# Patient Record
Sex: Male | Born: 1950 | Race: White | Hispanic: No | Marital: Married | Smoking: Never smoker
Health system: Southern US, Community
[De-identification: ages and names within clinical notes are randomized; demographics above are authoritative.]

## PROBLEM LIST (undated history)

## (undated) DIAGNOSIS — L570 Actinic keratosis: Secondary | ICD-10-CM

## (undated) DIAGNOSIS — M1990 Unspecified osteoarthritis, unspecified site: Secondary | ICD-10-CM

## (undated) DIAGNOSIS — IMO0001 Reserved for inherently not codable concepts without codable children: Secondary | ICD-10-CM

## (undated) DIAGNOSIS — M109 Gout, unspecified: Secondary | ICD-10-CM

## (undated) DIAGNOSIS — E785 Hyperlipidemia, unspecified: Secondary | ICD-10-CM

## (undated) DIAGNOSIS — I10 Essential (primary) hypertension: Secondary | ICD-10-CM

## (undated) HISTORY — DX: Reserved for inherently not codable concepts without codable children: IMO0001

## (undated) HISTORY — DX: Gout, unspecified: M10.9

## (undated) HISTORY — DX: Essential (primary) hypertension: I10

## (undated) HISTORY — PX: OTHER SURGICAL HISTORY: SHX169

## (undated) HISTORY — DX: Unspecified osteoarthritis, unspecified site: M19.90

## (undated) HISTORY — PX: VARICOSE VEIN SURGERY: SHX832

## (undated) HISTORY — DX: Hyperlipidemia, unspecified: E78.5

## (undated) HISTORY — DX: Actinic keratosis: L57.0

---

## 1993-03-27 DIAGNOSIS — IMO0001 Reserved for inherently not codable concepts without codable children: Secondary | ICD-10-CM

## 1993-03-27 HISTORY — DX: Reserved for inherently not codable concepts without codable children: IMO0001

## 2007-08-12 ENCOUNTER — Ambulatory Visit: Payer: Self-pay | Admitting: Gastroenterology

## 2009-04-06 LAB — HM COLONOSCOPY

## 2011-01-30 ENCOUNTER — Ambulatory Visit (INDEPENDENT_AMBULATORY_CARE_PROVIDER_SITE_OTHER): Payer: Self-pay | Admitting: Internal Medicine

## 2011-01-30 ENCOUNTER — Encounter: Payer: Self-pay | Admitting: Internal Medicine

## 2011-01-30 VITALS — BP 142/80 | HR 68 | Temp 98.2°F | Wt 196.0 lb

## 2011-01-30 DIAGNOSIS — G4700 Insomnia, unspecified: Secondary | ICD-10-CM

## 2011-01-30 DIAGNOSIS — Z0000 Encounter for general adult medical examination without abnormal findings: Secondary | ICD-10-CM

## 2011-01-30 DIAGNOSIS — I10 Essential (primary) hypertension: Secondary | ICD-10-CM

## 2011-01-30 MED ORDER — LISINOPRIL-HYDROCHLOROTHIAZIDE 20-25 MG PO TABS: 1.0000 | ORAL_TABLET | Freq: Every day | ORAL | Status: DC

## 2011-01-30 MED ORDER — TRAZODONE HCL 50 MG PO TABS: 50.0000 mg | ORAL_TABLET | Freq: Every day | ORAL | Status: AC

## 2011-01-30 NOTE — Progress Notes (Signed)
Subjective:    Patient ID: Chris Johnson, male    DOB: July 15, 1950, 60 y.o.   MRN: [id number]  HPI 60 year old male with a history of hypertension presents to establish care. He reports that he has had hypertension percent we'll years. He was started on lisinopril hydrochlorothiazide by his previous physician. He notes that recently his daughter who is a Theatre stage manager took his blood pressure and it was 165/100. He was concerned about this, in particular because his mother had a stroke in her late 40s. He denies any chest pain, palpitations, headache, shortness of breath. He reports fairly good compliance with his medication although notes that occasionally he forgets it. He reports that over the last several years he has improved his diet to limit saturated fat and increase fiber. 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  He notes a long history of insomnia and reports that he has been using Benadryl 100 mg nightly for the last several months. He reports good sleep on this dose. However, he notes that he has had to increase the dose in order to maintain a good night sleep. He is interested in trying other medications. He notes that he is tried Ambien in the past with improvement in sleep.  Outpatient Encounter Prescriptions as of 01/30/2011  Medication Sig Dispense Refill  . lisinopril-hydrochlorothiazide (PRINZIDE,ZESTORETIC) 20-25 MG per tablet Take 1 tablet by mouth daily.  90 tablet  4    Review of Systems  Constitutional: Negative for fever, chills, activity change, appetite change, fatigue and unexpected weight change.  Eyes: Negative for visual disturbance.  Respiratory: Negative for cough and shortness of breath.   Cardiovascular: Negative for chest pain, palpitations and leg swelling.  Gastrointestinal: Negative for abdominal pain and abdominal distention.  Genitourinary: Negative for dysuria, urgency and difficulty urinating.    Musculoskeletal: Negative for arthralgias and gait problem.  Skin: Negative for color change and rash.  Hematological: Negative for adenopathy.  Psychiatric/Behavioral: Positive for sleep disturbance. Negative for dysphoric mood. The patient is not nervous/anxious.    BP 142/80  Pulse 68  Temp(Src) 98.2 F (36.8 C) (Oral)  Wt 196 lb (88.905 kg)  SpO2 95%     Objective:   Physical Exam  Constitutional: He is oriented to person, place, and time. He appears well-developed and well-nourished. No distress.  HENT:  Head: Normocephalic and atraumatic.  Right Ear: External ear normal.  Left Ear: External ear normal.  Nose: Nose normal.  Mouth/Throat: Oropharynx is clear and moist. No oropharyngeal exudate.  Eyes: Conjunctivae and EOM are normal. Pupils are equal, round, and reactive to light. Right eye exhibits no discharge. Left eye exhibits no discharge. No scleral icterus.  Neck: Normal range of motion. Neck supple. Carotid bruit is not present. No tracheal deviation present. No thyromegaly present.  Cardiovascular: Normal rate, regular rhythm and normal heart sounds.  Exam reveals no gallop and no friction rub.   No murmur heard. Pulmonary/Chest: Effort normal and breath sounds normal. No respiratory distress. He has no wheezes. He has no rales. He exhibits no tenderness.  Abdominal: Soft. Bowel sounds are normal. He exhibits no distension and no mass. There is no tenderness. There is no rebound and no guarding.  Musculoskeletal: Normal range of motion. He exhibits no edema.  Lymphadenopathy:    He has no cervical adenopathy.  Neurological: He is alert and oriented to person, place, and time. No cranial nerve deficit. Coordination normal.  Skin: Skin  is warm and dry. No rash noted. He is not diaphoretic. No erythema. No pallor.  Psychiatric: He has a normal mood and affect. His behavior is normal. Judgment and thought content normal.          Assessment & Plan:  1. Hypertension  - BP just above goal today. Pt reports occasional elevated BP at home. Will monitor and add medication as necessary. Will check renal function with labs next week. Follow up 1 month.  2. Insomnia - Pt using 100mg  nightly of Benadryl with minimal improvement. Will try Trazodone 50mg  nightly, and advance as needed up to 150mg  nightly. Follow up 1 month.

## 2011-01-30 NOTE — Patient Instructions (Signed)
Labs next Monday. Follow up 1 month.

## 2011-02-06 ENCOUNTER — Other Ambulatory Visit (INDEPENDENT_AMBULATORY_CARE_PROVIDER_SITE_OTHER): Payer: PRIVATE HEALTH INSURANCE | Admitting: *Deleted

## 2011-02-06 DIAGNOSIS — I10 Essential (primary) hypertension: Secondary | ICD-10-CM

## 2011-02-06 DIAGNOSIS — Z0000 Encounter for general adult medical examination without abnormal findings: Secondary | ICD-10-CM

## 2011-02-07 ENCOUNTER — Telehealth: Payer: Self-pay | Admitting: Internal Medicine

## 2011-02-07 ENCOUNTER — Ambulatory Visit: Payer: PRIVATE HEALTH INSURANCE | Admitting: *Deleted

## 2011-02-07 DIAGNOSIS — Z136 Encounter for screening for cardiovascular disorders: Secondary | ICD-10-CM

## 2011-02-07 LAB — CBC WITH DIFFERENTIAL/PLATELET
Eosinophils Absolute: 0.2 10*3/uL (ref 0.0–0.7)
Eosinophils Relative: 2.9 % (ref 0.0–5.0)
HCT: 43.3 % (ref 39.0–52.0)
Lymphs Abs: 1.8 10*3/uL (ref 0.7–4.0)
MCHC: 34.3 g/dL (ref 30.0–36.0)
MCV: 87.3 fl (ref 78.0–100.0)
Monocytes Absolute: 0.5 10*3/uL (ref 0.1–1.0)
Neutrophils Relative %: 57 % (ref 43.0–77.0)
Platelets: 253 10*3/uL (ref 150.0–400.0)
RDW: 13.4 % (ref 11.5–14.6)
WBC: 5.8 10*3/uL (ref 4.5–10.5)

## 2011-02-07 LAB — LIPID PANEL
Cholesterol: 239 mg/dL — ABNORMAL HIGH (ref 0–200)
HDL: 41.9 mg/dL (ref >39.00)
Triglycerides: 260 mg/dL — ABNORMAL HIGH (ref 0.0–149.0)

## 2011-02-07 LAB — COMPREHENSIVE METABOLIC PANEL
Albumin: 4 g/dL (ref 3.5–5.2)
BUN: 19 mg/dL (ref 6–23)
Calcium: 9.2 mg/dL (ref 8.4–10.5)
Chloride: 101 mEq/L (ref 96–112)
Creatinine, Ser: 1.3 mg/dL (ref 0.4–1.5)
GFR: 59.71 mL/min — ABNORMAL LOW (ref >60.00)
Glucose, Bld: 92 mg/dL (ref 70–99)
Potassium: 3.6 mEq/L (ref 3.5–5.1)

## 2011-02-07 NOTE — Telephone Encounter (Signed)
EKG normal.

## 2011-02-07 NOTE — Telephone Encounter (Signed)
Left detailed VM on pt's #

## 2011-02-13 ENCOUNTER — Ambulatory Visit (INDEPENDENT_AMBULATORY_CARE_PROVIDER_SITE_OTHER): Payer: PRIVATE HEALTH INSURANCE | Admitting: Internal Medicine

## 2011-02-13 ENCOUNTER — Encounter: Payer: Self-pay | Admitting: Internal Medicine

## 2011-02-13 VITALS — BP 92/60 | HR 61 | Temp 98.3°F | Resp 14 | Wt 196.2 lb

## 2011-02-13 DIAGNOSIS — I10 Essential (primary) hypertension: Secondary | ICD-10-CM

## 2011-02-13 DIAGNOSIS — E785 Hyperlipidemia, unspecified: Secondary | ICD-10-CM

## 2011-02-13 MED ORDER — LISINOPRIL-HYDROCHLOROTHIAZIDE 20-25 MG PO TABS: 1.0000 | ORAL_TABLET | Freq: Every day | ORAL | Status: DC

## 2011-02-13 NOTE — Progress Notes (Signed)
Subjective:    Patient ID: Chris Johnson, male    DOB: 1950-09-07, 60 y.o.   MRN: [id number]  HPI 60 year old male with a history of hypertension presents for followup. He reports that over the last month he has increased his compliance with his lisinopril hydrochlorothiazide. He notes that his blood pressure has been running lower. Last week he had an illness in which he had some nausea and vomiting and reports feeling very fatigued. He felt lightheaded during that time. He reports feeling better over the weekend and today. Last night, his systolic blood pressure was 110. He reports that he has also made more of an effort at eating healthy and exercising.  After his last visit, he had lab work including lipid profile which showed elevated LDL cholesterol at 156. We reviewed his lab work today.  Outpatient Encounter Prescriptions as of 02/13/2011  Medication Sig Dispense Refill  . lisinopril-hydrochlorothiazide (PRINZIDE,ZESTORETIC) 20-25 MG per tablet Take 1 tablet by mouth daily.  90 tablet  4  . traZODone (DESYREL) 50 MG tablet Take 1 tablet (50 mg total) by mouth at bedtime.  30 tablet  3    Review of Systems  Constitutional: Positive for fatigue (last week, resolved). Negative for fever, chills, activity change, appetite change and unexpected weight change.  Eyes: Negative for visual disturbance.  Respiratory: Negative for cough and shortness of breath.   Cardiovascular: Negative for chest pain, palpitations and leg swelling.  Gastrointestinal: Positive for nausea and vomiting (last week, resolved). Negative for abdominal pain and abdominal distention.  Genitourinary: Negative for dysuria, urgency and difficulty urinating.  Musculoskeletal: Negative for arthralgias and gait problem.  Skin: Negative for color change and rash.  Neurological: Positive for dizziness (last week, resolved).  Hematological: Negative for adenopathy.  Psychiatric/Behavioral: Negative for sleep disturbance and  dysphoric mood. The patient is not nervous/anxious.    BP 92/60  Pulse 61  Temp(Src) 98.3 F (36.8 C) (Oral)  Resp 14  Wt 196 lb 4 oz (89.018 kg)  SpO2 95%     Objective:   Physical Exam  Constitutional: He is oriented to person, place, and time. He appears well-developed and well-nourished. No distress.  HENT:  Head: Normocephalic and atraumatic.  Right Ear: External ear normal.  Left Ear: External ear normal.  Nose: Nose normal.  Mouth/Throat: Oropharynx is clear and moist. No oropharyngeal exudate.  Eyes: Conjunctivae and EOM are normal. Pupils are equal, round, and reactive to light. Right eye exhibits no discharge. Left eye exhibits no discharge. No scleral icterus.  Neck: Normal range of motion. Neck supple. No tracheal deviation present. No thyromegaly present.  Cardiovascular: Normal rate, regular rhythm and normal heart sounds.  Exam reveals no gallop and no friction rub.   No murmur heard. Pulmonary/Chest: Effort normal and breath sounds normal. No respiratory distress. He has no wheezes. He has no rales. He exhibits no tenderness.  Musculoskeletal: Normal range of motion. He exhibits no edema.  Lymphadenopathy:    He has no cervical adenopathy.  Neurological: He is alert and oriented to person, place, and time. No cranial nerve deficit. Coordination normal.  Skin: Skin is warm and dry. No rash noted. He is not diaphoretic. No erythema. No pallor.  Psychiatric: He has a normal mood and affect. His behavior is normal. Judgment and thought content normal.          Assessment & Plan:  1. Hypertension - blood pressure is running much lower, likely with increased compliance with medication. We discussed stopping the hydrochlorothiazide  component of this medication. He will continue to monitor his blood pressure, if his systolic blood pressure is running below 120 we will stop his hydrochlorothiazide. Otherwise, he will continue his medication and followup in 3 months.  2.  Hyperlipidemia - LDL 156. Goal <100. We discussed options for treatment of high cholesterol including statin medications. Recommended starting Lipitor 20 mg daily. We also discussed dietary interventions including limiting intake of saturated fat and increasing intake of fiber. He would prefer to give a trial of dietary and exercise interventions in an effort to lower his cholesterol. We'll plan to proceed with this and have him repeat his lipid profile in 3 months. Gave references on dietary interventions. If no improvement, will start Lipitor.

## 2011-02-13 NOTE — Patient Instructions (Signed)
Prevent and Reverse Heart Disease by Neomia Dear Repeat labs in 3 months.

## 2011-05-17 ENCOUNTER — Encounter: Payer: Self-pay | Admitting: Internal Medicine

## 2011-05-17 ENCOUNTER — Ambulatory Visit (INDEPENDENT_AMBULATORY_CARE_PROVIDER_SITE_OTHER): Payer: PRIVATE HEALTH INSURANCE | Admitting: Internal Medicine

## 2011-05-17 VITALS — BP 140/80 | HR 80 | Temp 97.8°F | Wt 197.0 lb

## 2011-05-17 DIAGNOSIS — H6690 Otitis media, unspecified, unspecified ear: Secondary | ICD-10-CM

## 2011-05-17 DIAGNOSIS — R42 Dizziness and giddiness: Secondary | ICD-10-CM

## 2011-05-17 DIAGNOSIS — H6692 Otitis media, unspecified, left ear: Secondary | ICD-10-CM

## 2011-05-17 LAB — COMPREHENSIVE METABOLIC PANEL
ALT: 13 U/L (ref 0–53)
AST: 17 U/L (ref 0–37)
Creat: 1.45 mg/dL — ABNORMAL HIGH (ref 0.50–1.35)
Sodium: 137 mEq/L (ref 135–145)
Total Bilirubin: 0.3 mg/dL (ref 0.3–1.2)

## 2011-05-17 LAB — LIPID PANEL
HDL: 38 mg/dL — ABNORMAL LOW (ref >39)
LDL Cholesterol: 127 mg/dL — ABNORMAL HIGH (ref 0–99)
Triglycerides: 253 mg/dL — ABNORMAL HIGH (ref <150)
VLDL: 51 mg/dL — ABNORMAL HIGH (ref 0–40)

## 2011-05-17 LAB — CBC WITH DIFFERENTIAL/PLATELET
Basophils Absolute: 0.1 10*3/uL (ref 0.0–0.1)
Basophils Relative: 1 % (ref 0–1)
Eosinophils Absolute: 0.2 10*3/uL (ref 0.0–0.7)
Eosinophils Relative: 3 % (ref 0–5)
HCT: 40.1 % (ref 39.0–52.0)
Lymphocytes Relative: 28 % (ref 12–46)
MCH: 28.6 pg (ref 26.0–34.0)
MCHC: 34.4 g/dL (ref 30.0–36.0)
MCV: 83.2 fL (ref 78.0–100.0)
Monocytes Absolute: 0.4 10*3/uL (ref 0.1–1.0)
RDW: 13.1 % (ref 11.5–15.5)

## 2011-05-17 MED ORDER — AMOXICILLIN-POT CLAVULANATE 875-125 MG PO TABS: 1.0000 | ORAL_TABLET | Freq: Two times a day (BID) | ORAL | Status: AC

## 2011-05-17 MED ORDER — MECLIZINE HCL 25 MG PO TABS: 25.0000 mg | ORAL_TABLET | Freq: Three times a day (TID) | ORAL | Status: AC | PRN

## 2011-05-17 NOTE — Assessment & Plan Note (Signed)
Symptoms now resolved. Symptoms most consistent with vertigo secondary to otitis media.  Will treat with augmentin x 10 days. Will check CBC, CMP, TSH, B12. If any recurrent symptoms, or new symptoms such as recurrent trouble with word finding, new focal neurologic changes such as numbness, pt will go to ED for evaluation to including CT head. Otherwise follow up here 1 week.

## 2011-05-17 NOTE — Progress Notes (Signed)
Subjective:    Patient ID: Chris Johnson, male    DOB: Nov 03, 1950, 61 y.o.   MRN: [id number]  HPI 61YO male with h/o HTN presents as walk-in acute visit c/o 1 day h/o dizziness, malaise.  Also notes recent sore throat intermittently over last month, mild nasal congestion and occasional loose stool.  Denies fever or chills. Today, had some trouble with thinking clearly, word finding. Describes some anxiety that symptoms might be related to elevated BP.  Has been compliant with BP meds. Denies any chest pain, neck pain, headache, focal neurologic symptoms such as weakness or numbness.  Outpatient Encounter Prescriptions as of 05/17/2011  Medication Sig Dispense Refill  . lisinopril-hydrochlorothiazide (PRINZIDE,ZESTORETIC) 20-25 MG per tablet Take 1 tablet by mouth daily.  90 tablet  4  . Red Yeast Rice 600 MG CAPS Take 1 capsule by mouth daily.      Marland Kitchen amoxicillin-clavulanate (AUGMENTIN) 875-125 MG per tablet Take 1 tablet by mouth 2 (two) times daily.  20 tablet  0  . meclizine (ANTIVERT) 25 MG tablet Take 1 tablet (25 mg total) by mouth 3 (three) times daily as needed.  30 tablet  0    Review of Systems  Constitutional: Positive for fatigue. Negative for fever, chills, activity change, appetite change and unexpected weight change.  HENT: Positive for congestion and sore throat. Negative for ear pain, trouble swallowing and voice change.   Eyes: Positive for pain. Negative for visual disturbance.  Respiratory: Negative for cough and shortness of breath.   Cardiovascular: Negative for chest pain, palpitations and leg swelling.  Gastrointestinal: Negative for abdominal pain and abdominal distention.  Genitourinary: Negative for dysuria, urgency and difficulty urinating.  Musculoskeletal: Negative for arthralgias and gait problem.  Skin: Negative for color change and rash.  Neurological: Positive for dizziness. Negative for tremors, seizures, syncope, facial asymmetry, speech difficulty,  weakness, light-headedness, numbness and headaches.  Hematological: Negative for adenopathy.  Psychiatric/Behavioral: Positive for decreased concentration. Negative for sleep disturbance and dysphoric mood. The patient is nervous/anxious.    BP 140/80  Pulse 80  Temp(Src) 97.8 F (36.6 C) (Oral)  Wt 197 lb (89.359 kg)  SpO2 98%     Objective:   Physical Exam  Constitutional: He is oriented to person, place, and time. He appears well-developed and well-nourished. No distress.  HENT:  Head: Normocephalic and atraumatic.  Right Ear: External ear normal. Tympanic membrane is erythematous. A middle ear effusion is present.  Left Ear: External ear normal. Tympanic membrane is erythematous. A middle ear effusion is present.  Nose: Nose normal.  Mouth/Throat: Oropharynx is clear and moist. No oropharyngeal exudate or posterior oropharyngeal erythema.       Bilateral ear canals initially impacted with cerumen, which was removed with lavage.  Eyes: Conjunctivae and EOM are normal. Pupils are equal, round, and reactive to light. Right eye exhibits no discharge. Left eye exhibits no discharge. No scleral icterus.  Neck: Normal range of motion. Neck supple. No tracheal deviation present. No thyromegaly present.  Cardiovascular: Normal rate, regular rhythm and normal heart sounds.  Exam reveals no gallop and no friction rub.   No murmur heard. Pulmonary/Chest: Effort normal and breath sounds normal. No respiratory distress. He has no wheezes. He has no rales. He exhibits no tenderness.  Musculoskeletal: Normal range of motion. He exhibits no edema.  Lymphadenopathy:    He has no cervical adenopathy.  Neurological: He is alert and oriented to person, place, and time. He has normal strength. He displays no atrophy and  no tremor. No cranial nerve deficit or sensory deficit. He exhibits normal muscle tone. Coordination and gait normal.  Skin: Skin is warm and dry. No rash noted. He is not diaphoretic.  No erythema. No pallor.  Psychiatric: He has a normal mood and affect. His behavior is normal. Judgment and thought content normal.          Assessment & Plan:

## 2011-05-17 NOTE — Assessment & Plan Note (Signed)
Exam c/w vertigo. Will treat with augmentin x 10 days. Pt will RTC 1 week for recheck or sooner if symptoms not improving.

## 2011-05-18 ENCOUNTER — Telehealth: Payer: Self-pay | Admitting: Internal Medicine

## 2011-05-18 ENCOUNTER — Encounter: Payer: Self-pay | Admitting: Internal Medicine

## 2011-05-18 NOTE — Telephone Encounter (Signed)
[facility] Triage Call Report Triage Record Num: [id number] Operator: Audelia Hives Patient Name: Chris Johnson Call Date & Time: 05/17/2011 6:04:40PM Patient Phone: [phone] PCP: Ronna Polio Patient Gender: Male PCP Fax : [phone] Patient DOB: 11/12/1950 Practice Name: [facility] Reason for Call: Caller: Delaney Meigs; PCP: Ronna Polio; CB#: [phone]; Call regarding Delaney Meigs is calling from [place] regarding a CBC ordered on Chris Johnson by Ronna Polio.; Tamara calling re STAT labs, CBC normal, LDL 127, VLDL 51, HDL 38, Trig 253, BUN 16, CR 1.45, Chol 216, TSH and B12 pending. No criticals to report. Protocol(s) Used: PCP Calls, No Triage (Adult) Recommended Outcome per Protocol: Call Provider within 24 Hours Reason for Outcome: Lab calling with test results Care Advice: ~ 05/17/2011 6:18:35PM Page 1 of 1 [email or web address]

## 2011-06-09 ENCOUNTER — Ambulatory Visit (INDEPENDENT_AMBULATORY_CARE_PROVIDER_SITE_OTHER): Payer: PRIVATE HEALTH INSURANCE | Admitting: Internal Medicine

## 2011-06-09 ENCOUNTER — Encounter: Payer: Self-pay | Admitting: Internal Medicine

## 2011-06-09 VITALS — BP 118/76 | HR 100 | Temp 98.6°F | Resp 18 | Ht 71.0 in | Wt 190.2 lb

## 2011-06-09 DIAGNOSIS — J019 Acute sinusitis, unspecified: Secondary | ICD-10-CM

## 2011-06-09 DIAGNOSIS — H109 Unspecified conjunctivitis: Secondary | ICD-10-CM

## 2011-06-09 MED ORDER — GUAIFENESIN-CODEINE 100-10 MG/5ML PO SYRP: 5.0000 mL | ORAL_SOLUTION | Freq: Three times a day (TID) | ORAL | Status: AC | PRN

## 2011-06-09 MED ORDER — PREDNISONE (PAK) 10 MG PO TABS: ORAL_TABLET | ORAL | Status: AC

## 2011-06-09 MED ORDER — AMOXICILLIN-POT CLAVULANATE 875-125 MG PO TABS: 1.0000 | ORAL_TABLET | Freq: Two times a day (BID) | ORAL | Status: DC

## 2011-06-09 NOTE — Progress Notes (Deleted)
  Subjective:    Patient ID: Chris Johnson, male    DOB: 11-15-1950, 61 y.o.   MRN: [id number]  HPI  4 day history of productive cough,  Mild myalgias,  phlegm production,    Congestion,,   No ear pain,  No  has conjuncitivitis    Review of Systems     Objective:   Physical Exam        Assessment & Plan:

## 2011-06-09 NOTE — Patient Instructions (Addendum)
Start Benadryl 25 mg every every 6 to 8 hours for drainage,  Sudafed PE 10 to 30 mg  every 8 hours for the congestion and  Simply  Saline lavage AM and PM  To flush out siniuses  Instead of night time sudafed use OTC Afrin nasal spray (after your saline spray)   10 days of augmentin as your antibiotic   Cough medicine with codeine  cheratussin (take one hour before bedtime)     Start the prednisone on Sunday or Monday if your congestion is not better.

## 2011-06-11 ENCOUNTER — Encounter: Payer: Self-pay | Admitting: Internal Medicine

## 2011-06-11 DIAGNOSIS — H109 Unspecified conjunctivitis: Secondary | ICD-10-CM | POA: Insufficient documentation

## 2011-06-11 DIAGNOSIS — J019 Acute sinusitis, unspecified: Secondary | ICD-10-CM | POA: Insufficient documentation

## 2011-06-11 NOTE — Progress Notes (Signed)
Patient ID: Chris Johnson, male   DOB: 02/11/1951, 61 y.o.   MRN: [id number]  Patient Active Problem List  Diagnoses  . Hypertension  . Insomnia  . Dizziness  . Left otitis media    Subjective:   Chief Complaint  Patient presents with  . Cough  . Conjunctivitis    possible    HPI:   Chris Johnson a 61 y.o. male who presents with a 4 day history of productive cough accompanied by md myalgias low-grade fevers and excessive discolored phlegm production. He notes a sinus congestion and scratchy throat but denies ear pain and dizziness. He developed conjunctivitis on the morning of evaluation. He has several sick contacts in his home. All affected members are having the same symptoms. His daughter was treated for bacterial conjunctivitis yesterday.  Review of Systems:  12 point  review of systems is negative except for those addressed in the HPI,    The following portions of the patient's history were reviewed and updated as appropriate: Allergies, current medications, and problem list. Past Medical History  Diagnosis Date  . Hypertension   . Arthritis   . Normal cardiac stress test 1995    cardiolyte   Past Surgical History  Procedure Date  . Varicose vein surgery    History   Social History  . Marital Status: Married    Spouse Name: N/A    Number of Children: 1  . Years of Education: N/A   Occupational History  . Not on file.   Social History Main Topics  . Smoking status: Never Smoker   . Smokeless tobacco: Never Used  . Alcohol Use: 2.5 oz/week    5 drink(s) per week     Occasional  . Drug Use: No  . Sexually Active: Not on file   Other Topics Concern  . Not on file   Social History Narrative   Regular Exercise -  House/yard work 15 acresDaily Caffeine Use:  1 cup coffee, occasional sodaLives in [place]ing [place].     Objective:  BP 118/76  Pulse 100  Temp(Src) 98.6 F (37 C) (Oral)  Resp 18  Ht 5\' 11"  (1.803 m)  Wt 190 lb 4  oz (86.297 kg)  BMI 26.53 kg/m2  SpO2 95%  General:  Well-developed, well-nourished. In acute mild distress General appearance: alert, cooperative, appears stated age, flushed and mild distress Eyes: positive findings: conjunctiva: 1+ bacterial conjunctivitis Ears: normal TM's and external ear canals both ears Nose: Nares normal. Septum midline. Mucosa normal. No drainage or sinus tenderness., mild congestion, turbinates red, swollen, right turbinate red, swollen, sinus tenderness bilateral Throat: normal findings: lips normal without lesions Lungs: clear to auscultation bilaterally Chest wall: no tenderness    No Follow-up on file.   Medications:   Current Outpatient Prescriptions  Medication Sig Dispense Refill  . lisinopril-hydrochlorothiazide (PRINZIDE,ZESTORETIC) 20-25 MG per tablet Take 1 tablet by mouth daily.  90 tablet  4  . Red Yeast Rice 600 MG CAPS Take 1 capsule by mouth daily.      . traZODone (DESYREL) 50 MG tablet Take 50 mg by mouth at bedtime.      Marland Kitchen amoxicillin-clavulanate (AUGMENTIN) 875-125 MG per tablet Take 1 tablet by mouth 2 (two) times daily.  20 tablet  0  . guaiFENesin-codeine (ROBITUSSIN AC) 100-10 MG/5ML syrup Take 5 mLs by mouth 3 (three) times daily as needed for cough.  240 mL  1  . predniSONE (STERAPRED UNI-PAK) 10 MG tablet 6 tablets on day 1,  decrease by tablet daily until gone  21 tablet  0      Sinusitis acute Given chronicity of symptoms, development of facial pain and exam consistent with bacterial URI,  Will treat with empiric antibiotics, decongestants, and saline lavage.    Conjunctivitis of both eyes I DID not address the bilateral conjunctivitis with a separate topical alignment since both eyes were affected and he was going to receive treatment with Augmentin. I recommended that he use sterile saline I flushed his and call back if his symptoms did not improve with use of Augmentin.    Updated Medication List Outpatient Encounter  Prescriptions as of 06/09/2011  Medication Sig Dispense Refill  . lisinopril-hydrochlorothiazide (PRINZIDE,ZESTORETIC) 20-25 MG per tablet Take 1 tablet by mouth daily.  90 tablet  4  . Red Yeast Rice 600 MG CAPS Take 1 capsule by mouth daily.      . traZODone (DESYREL) 50 MG tablet Take 50 mg by mouth at bedtime.      Marland Kitchen amoxicillin-clavulanate (AUGMENTIN) 875-125 MG per tablet Take 1 tablet by mouth 2 (two) times daily.  20 tablet  0  . guaiFENesin-codeine (ROBITUSSIN AC) 100-10 MG/5ML syrup Take 5 mLs by mouth 3 (three) times daily as needed for cough.  240 mL  1  . predniSONE (STERAPRED UNI-PAK) 10 MG tablet 6 tablets on day 1, decrease by tablet daily until gone  21 tablet  0

## 2011-06-11 NOTE — Assessment & Plan Note (Signed)
I DID not address the bilateral conjunctivitis with a separate topical alignment since both eyes were affected and he was going to receive treatment with Augmentin. I recommended that he use sterile saline I flushed his and call back if his symptoms did not improve with use of Augmentin.

## 2011-06-11 NOTE — Assessment & Plan Note (Signed)
Given chronicity of symptoms, development of facial pain and exam consistent with bacterial URI,  Will treat with empiric antibiotics, decongestants, and saline lavage.   

## 2011-10-02 ENCOUNTER — Encounter: Payer: Self-pay | Admitting: Internal Medicine

## 2011-10-02 ENCOUNTER — Other Ambulatory Visit: Payer: Self-pay | Admitting: Internal Medicine

## 2011-10-02 ENCOUNTER — Ambulatory Visit (INDEPENDENT_AMBULATORY_CARE_PROVIDER_SITE_OTHER): Payer: PRIVATE HEALTH INSURANCE | Admitting: Internal Medicine

## 2011-10-02 VITALS — BP 130/80 | HR 64 | Temp 98.7°F | Ht 71.0 in | Wt 193.2 lb

## 2011-10-02 DIAGNOSIS — H60399 Other infective otitis externa, unspecified ear: Secondary | ICD-10-CM

## 2011-10-02 DIAGNOSIS — H6093 Unspecified otitis externa, bilateral: Secondary | ICD-10-CM | POA: Insufficient documentation

## 2011-10-02 DIAGNOSIS — H6691 Otitis media, unspecified, right ear: Secondary | ICD-10-CM | POA: Insufficient documentation

## 2011-10-02 DIAGNOSIS — H6690 Otitis media, unspecified, unspecified ear: Secondary | ICD-10-CM

## 2011-10-02 MED ORDER — NEOMYCIN-POLYMYXIN-HC 3.5-10000-1 OT SUSP: 4.0000 [drp] | Freq: Two times a day (BID) | OTIC | Status: DC

## 2011-10-02 MED ORDER — AMOXICILLIN 500 MG PO TABS: 500.0000 mg | ORAL_TABLET | Freq: Three times a day (TID) | ORAL | Status: DC

## 2011-10-02 MED ORDER — AMOXICILLIN-POT CLAVULANATE 875-125 MG PO TABS: 1.0000 | ORAL_TABLET | Freq: Two times a day (BID) | ORAL | Status: DC

## 2011-10-02 MED ORDER — CIPROFLOXACIN-DEXAMETHASONE 0.3-0.1 % OT SUSP: 4.0000 [drp] | Freq: Two times a day (BID) | OTIC | Status: DC

## 2011-10-02 NOTE — Progress Notes (Signed)
Subjective:    Patient ID: Chris Johnson, male    DOB: [birth year], 61 y.o.   MRN: [id number]  HPI 61 year old male with history of hypertension presents for acute visit complaining of bilateral ear pain and purulent drainage from his ear canals. He denies any fever or chills. He notes he has been recently swimming. He reports history of recurrent external ear infections in the past. He has not taken any medication for this.  Outpatient Encounter Prescriptions as of 10/02/2011  Medication Sig Dispense Refill  . lisinopril-hydrochlorothiazide (PRINZIDE,ZESTORETIC) 20-25 MG per tablet Take 1 tablet by mouth daily.  90 tablet  4  . traZODone (DESYREL) 50 MG tablet Take 50 mg by mouth at bedtime.      Marland Kitchen amoxicillin-clavulanate (AUGMENTIN) 875-125 MG per tablet Take 1 tablet by mouth 2 (two) times daily.  20 tablet  0  . ciprofloxacin-dexamethasone (CIPRODEX) otic suspension Place 4 drops into both ears 2 (two) times daily.  7.5 mL  0  . DISCONTD: Red Yeast Rice 600 MG CAPS Take 1 capsule by mouth daily.        Review of Systems  Constitutional: Negative for fever, chills, activity change and fatigue.  HENT: Positive for ear pain and ear discharge. Negative for hearing loss, nosebleeds, congestion, sore throat, rhinorrhea, sneezing, trouble swallowing, neck pain, neck stiffness, voice change, postnasal drip, sinus pressure and tinnitus.   Eyes: Negative for discharge, redness, itching and visual disturbance.  Respiratory: Negative for cough, chest tightness, shortness of breath, wheezing and stridor.   Cardiovascular: Negative for chest pain and leg swelling.  Musculoskeletal: Negative for myalgias and arthralgias.  Skin: Negative for color change and rash.  Neurological: Negative for dizziness, facial asymmetry and headaches.  Psychiatric/Behavioral: Negative for disturbed wake/sleep cycle.   BP 130/80  Pulse 64  Temp 98.7 F (37.1 C) (Oral)  Ht 5\' 11"  (1.803 m)  Wt 193 lb 4 oz (87.658 kg)   BMI 26.95 kg/m2  SpO2 98%     Objective:   Physical Exam  Constitutional: He is oriented to person, place, and time. He appears well-developed and well-nourished. No distress.  HENT:  Head: Normocephalic and atraumatic.  Right Ear: There is drainage, swelling and tenderness. Tympanic membrane is erythematous and bulging. A middle ear effusion is present.  Left Ear: There is drainage, swelling and tenderness. Tympanic membrane is not erythematous and not bulging.  No middle ear effusion.  Nose: Nose normal.  Mouth/Throat: Oropharynx is clear and moist. No oropharyngeal exudate.  Eyes: Conjunctivae and EOM are normal. Pupils are equal, round, and reactive to light. Right eye exhibits no discharge. Left eye exhibits no discharge. No scleral icterus.  Neck: Normal range of motion. Neck supple. No tracheal deviation present. No thyromegaly present.  Cardiovascular: Normal rate, regular rhythm and normal heart sounds.  Exam reveals no gallop and no friction rub.   No murmur heard. Pulmonary/Chest: Effort normal and breath sounds normal. No respiratory distress. He has no wheezes. He has no rales. He exhibits no tenderness.  Musculoskeletal: Normal range of motion. He exhibits no edema.  Lymphadenopathy:    He has no cervical adenopathy.  Neurological: He is alert and oriented to person, place, and time. No cranial nerve deficit. Coordination normal.  Skin: Skin is warm and dry. No rash noted. He is not diaphoretic. No erythema. No pallor.  Psychiatric: He has a normal mood and affect. His behavior is normal. Judgment and thought content normal.          Assessment &  Plan:

## 2011-10-02 NOTE — Progress Notes (Addendum)
Patient called to request different Rxs for Augmentin and Ciprodex because they were both to expensive.  Rx for Amoxicillin and Cortisporin called to pharmacy.

## 2011-10-02 NOTE — Assessment & Plan Note (Signed)
Symptoms consistent with right OM. Will treat with augmentin. Pt will call if symptoms not improving over next 48hr.

## 2011-10-02 NOTE — Addendum Note (Signed)
Addended by: Gilmer Mor on: 10/02/2011 05:14 PM   Modules accepted: Orders

## 2011-10-02 NOTE — Assessment & Plan Note (Signed)
Symptoms and exam are consistent with bilateral otitis externa. Will treat with Ciprodex. Patient will call if symptoms are not improving over the next 48 hours. Encouraged him to refrain from swimming until infection cleared.

## 2011-11-01 ENCOUNTER — Encounter: Payer: Self-pay | Admitting: Internal Medicine

## 2011-11-01 MED ORDER — ZOLPIDEM TARTRATE ER 6.25 MG PO TBCR: 6.2500 mg | EXTENDED_RELEASE_TABLET | Freq: Every evening | ORAL | Status: DC | PRN

## 2011-11-01 MED ORDER — ATORVASTATIN CALCIUM 20 MG PO TABS: 20.0000 mg | ORAL_TABLET | Freq: Every day | ORAL | Status: DC

## 2011-11-06 ENCOUNTER — Other Ambulatory Visit: Payer: Self-pay | Admitting: *Deleted

## 2011-11-06 MED ORDER — ATORVASTATIN CALCIUM 20 MG PO TABS: 20.0000 mg | ORAL_TABLET | Freq: Every day | ORAL | Status: DC

## 2011-11-22 ENCOUNTER — Other Ambulatory Visit: Payer: Self-pay | Admitting: *Deleted

## 2011-11-22 MED ORDER — ZOLPIDEM TARTRATE ER 6.25 MG PO TBCR: 6.2500 mg | EXTENDED_RELEASE_TABLET | Freq: Every evening | ORAL | Status: DC | PRN

## 2011-11-22 MED ORDER — ATORVASTATIN CALCIUM 20 MG PO TABS: 20.0000 mg | ORAL_TABLET | Freq: Every day | ORAL | Status: DC

## 2011-11-23 NOTE — Telephone Encounter (Addendum)
Rx's for Ambien and Lipitor called to pharmacist at [facility].

## 2012-02-02 ENCOUNTER — Other Ambulatory Visit: Payer: Self-pay | Admitting: Internal Medicine

## 2012-02-05 ENCOUNTER — Other Ambulatory Visit: Payer: Self-pay | Admitting: *Deleted

## 2012-02-05 MED ORDER — ZOLPIDEM TARTRATE ER 6.25 MG PO TBCR: 6.2500 mg | EXTENDED_RELEASE_TABLET | Freq: Every evening | ORAL | Status: DC | PRN

## 2012-02-05 NOTE — Telephone Encounter (Signed)
Rx faxed to [facility]

## 2012-02-20 ENCOUNTER — Ambulatory Visit: Payer: PRIVATE HEALTH INSURANCE | Admitting: Internal Medicine

## 2012-02-27 ENCOUNTER — Encounter: Payer: Self-pay | Admitting: Internal Medicine

## 2012-02-27 ENCOUNTER — Ambulatory Visit (INDEPENDENT_AMBULATORY_CARE_PROVIDER_SITE_OTHER): Payer: PRIVATE HEALTH INSURANCE | Admitting: Internal Medicine

## 2012-02-27 VITALS — BP 126/82 | HR 72 | Temp 98.1°F | Resp 16 | Wt 191.5 lb

## 2012-02-27 DIAGNOSIS — Z125 Encounter for screening for malignant neoplasm of prostate: Secondary | ICD-10-CM

## 2012-02-27 DIAGNOSIS — I10 Essential (primary) hypertension: Secondary | ICD-10-CM

## 2012-02-27 DIAGNOSIS — L989 Disorder of the skin and subcutaneous tissue, unspecified: Secondary | ICD-10-CM

## 2012-02-27 DIAGNOSIS — E785 Hyperlipidemia, unspecified: Secondary | ICD-10-CM

## 2012-02-27 DIAGNOSIS — G4700 Insomnia, unspecified: Secondary | ICD-10-CM

## 2012-02-27 LAB — COMPREHENSIVE METABOLIC PANEL
Albumin: 4.5 g/dL (ref 3.5–5.2)
Alkaline Phosphatase: 53 U/L (ref 39–117)
Chloride: 97 mEq/L (ref 96–112)
Glucose, Bld: 89 mg/dL (ref 70–99)
Potassium: 3.6 mEq/L (ref 3.5–5.1)
Sodium: 136 mEq/L (ref 135–145)
Total Protein: 7.5 g/dL (ref 6.0–8.3)

## 2012-02-27 LAB — LIPID PANEL
Cholesterol: 267 mg/dL — ABNORMAL HIGH (ref 0–200)
Total CHOL/HDL Ratio: 7
Triglycerides: 311 mg/dL — ABNORMAL HIGH (ref 0.0–149.0)
VLDL: 62.2 mg/dL — ABNORMAL HIGH (ref 0.0–40.0)

## 2012-02-27 LAB — MICROALBUMIN / CREATININE URINE RATIO: Microalb, Ur: 64.5 mg/dL — ABNORMAL HIGH (ref 0.0–1.9)

## 2012-02-27 MED ORDER — ZOLPIDEM TARTRATE ER 6.25 MG PO TBCR: 6.2500 mg | EXTENDED_RELEASE_TABLET | Freq: Every evening | ORAL | Status: DC | PRN

## 2012-02-27 MED ORDER — LISINOPRIL-HYDROCHLOROTHIAZIDE 20-25 MG PO TABS: 1.0000 | ORAL_TABLET | Freq: Every day | ORAL | Status: DC

## 2012-02-27 MED ORDER — ZOLPIDEM TARTRATE ER 12.5 MG PO TBCR: 12.5000 mg | EXTENDED_RELEASE_TABLET | Freq: Every evening | ORAL | Status: DC | PRN

## 2012-02-27 NOTE — Assessment & Plan Note (Signed)
Skin lesion left thigh most consistent with actinic keratosis. Will set up dermatology evaluation.

## 2012-02-27 NOTE — Assessment & Plan Note (Signed)
Symptoms of insomnia poorly controlled with Ambien 6.25 mg. Will try increasing to 12.5 mg nightly. Followup in 6 months or sooner as needed.

## 2012-02-27 NOTE — Assessment & Plan Note (Signed)
Blood pressure has been well controlled on lisinopril hydrochlorothiazide. Will check renal function and urine microalbumin with labs today. Follow up 6 months and sooner as needed.

## 2012-02-27 NOTE — Assessment & Plan Note (Signed)
Will check lipids and LFTs with labs today. 

## 2012-02-27 NOTE — Progress Notes (Signed)
Subjective:    Patient ID: Chris Johnson, male    DOB: [birth year], 61 y.o.   MRN: [id number]  HPI A 61 year old male with history of hypertension, hyperlipidemia, and insomnia presents for followup. In regards to insomnia, he reports that current dose anemia does not seem effective in helping him to get a complete night sleep. He wonders if higher dose may be more helpful. He reports that he typically has early morning wakening at this dose. He denies daytime drowsiness.  In regards to hypertension and hyperlipidemia, he reports full compliance with this medication. He denies any chest pain, palpitations, headache. He did not bring a record of his blood pressure today.  He is also concerned today about skin lesion on his left anterior thigh which has been present for approximately 6 months. It is not painful. It has not changed in size. He also notes some skin lesions over his upper scalp which are described as nonpainful and bumpy. He has not recently seen his dermatologist.  Outpatient Encounter Prescriptions as of 02/27/2012  Medication Sig Dispense Refill  . lisinopril-hydrochlorothiazide (PRINZIDE,ZESTORETIC) 20-25 MG per tablet Take 1 tablet by mouth daily.  90 tablet  4  . Red Yeast Rice Extract (RED YEAST RICE PO) Take by mouth.      . zolpidem (AMBIEN CR) 12.5 MG CR tablet Take 1 tablet (12.5 mg total) by mouth at bedtime as needed for sleep.  90 tablet  3  . atorvastatin (LIPITOR) 20 MG tablet Take 1 tablet (20 mg total) by mouth daily.  90 tablet  3   BP 126/82  Pulse 72  Temp 98.1 F (36.7 C) (Oral)  Resp 16  Wt 191 lb 8 oz (86.864 kg)  Review of Systems  Constitutional: Negative for fever, chills, activity change, appetite change, fatigue and unexpected weight change.  Eyes: Negative for visual disturbance.  Respiratory: Negative for cough and shortness of breath.   Cardiovascular: Negative for chest pain, palpitations and leg swelling.  Gastrointestinal: Negative for  abdominal pain and abdominal distention.  Genitourinary: Negative for dysuria, urgency and difficulty urinating.  Musculoskeletal: Negative for arthralgias and gait problem.  Skin: Negative for color change and rash.  Hematological: Negative for adenopathy.  Psychiatric/Behavioral: Negative for sleep disturbance and dysphoric mood. The patient is not nervous/anxious.        Objective:   Physical Exam  Constitutional: He is oriented to person, place, and time. He appears well-developed and well-nourished. No distress.  HENT:  Head: Normocephalic and atraumatic.  Right Ear: External ear normal.  Left Ear: External ear normal.  Nose: Nose normal.  Mouth/Throat: Oropharynx is clear and moist. No oropharyngeal exudate.  Eyes: Conjunctivae normal and EOM are normal. Pupils are equal, round, and reactive to light. Right eye exhibits no discharge. Left eye exhibits no discharge. No scleral icterus.  Neck: Normal range of motion. Neck supple. No tracheal deviation present. No thyromegaly present.  Cardiovascular: Normal rate, regular rhythm and normal heart sounds.  Exam reveals no gallop and no friction rub.   No murmur heard. Pulmonary/Chest: Effort normal and breath sounds normal. No respiratory distress. He has no wheezes. He has no rales. He exhibits no tenderness.  Musculoskeletal: Normal range of motion. He exhibits no edema.  Lymphadenopathy:    He has no cervical adenopathy.  Neurological: He is alert and oriented to person, place, and time. No cranial nerve deficit. Coordination normal.  Skin: Skin is warm and dry. Lesion noted. No rash noted. He is not diaphoretic. No erythema.  No pallor.     Psychiatric: He has a normal mood and affect. His behavior is normal. Judgment and thought content normal.          Assessment & Plan:

## 2012-05-12 ENCOUNTER — Other Ambulatory Visit: Payer: Self-pay

## 2012-05-23 ENCOUNTER — Telehealth: Payer: Self-pay | Admitting: Internal Medicine

## 2012-05-23 NOTE — Telephone Encounter (Signed)
Routing call a nurse info

## 2012-05-23 NOTE — Telephone Encounter (Signed)
Patient Information:  Caller Name: [place]  Phone: [phone]  Patient: Chris Johnson, Chris Johnson  Gender: Male  DOB: December 11, 1950  Age: 62 Years  PCP: Ronna Polio (Adults only)  Office Follow Up:  Does the office need to follow up with this patient?: Yes  Instructions For The Office: This pt was advised to ED however he refused.  Requesting appt for tomorrow with MD.  Got disconnected from call and not able to reach pt back.  Left message for pt to call back to schedule appt for tomorrow.  Just wanted to make you guys aware.  Thanks.  RN Note:  Pt refuses recommendation for ED today.  Said he wants appt with MD for tomorrow.  Said lives next door to a Cardiologist and will stop by his house this afternoon and if he tells him to go to ED then he will go.  While pt was on hold, call was disconnected.  [place] attempted to call pt back several times but call goes to voice mail.  Left message on voice mail advising pt to call back.  Dr. Dan Humphreys did not have any appts for today or tomorrow, however appts are available with Dr. Hassie Bruce.  Advised caller again that recommendation is to go to ED (pt is out of town), however to call office back if he desires appt with Dr. Hassie Bruce for tomorrow.    Symptoms  Reason For Call & Symptoms: Calling today 05/23/12 regarding having chest pain for 1 1/2 weeks.  Comes and goes each day.  Has had issues of agina in the past.  Also having some dizziness today. Pt is out of town and wants appt for tomorrow.  Reviewed Health History In EMR: No  Reviewed Medications In EMR: No  Reviewed Allergies In EMR: No  Reviewed Surgeries / Procedures: No  Date of Onset of Symptoms: 05/12/2012  Treatments Tried: 81 mg ASA taking 3 at a time.  Has done that a couple of times each day this week.  Treatments Tried Worked: No  Guideline(s) Used:  Chest Pain  Disposition Per Guideline:   Go to ED Now  Reason For Disposition Reached:   Recent long-distance travel with prolonged time in  car, bus, plane, or train (i.e., within past 2 weeks; 6 or more hours duration)  Advice Given:  Call Back If:  Severe chest pain  Constant chest pain lasting longer than 5 minutes  You become worse.  Patient Refused Recommendation:  Patient Refused Appt, Patient Requests Appt At Later Date  wants appt in office for tomorrow.  Out of town today, refuses ED recommendation.

## 2012-05-24 ENCOUNTER — Encounter: Payer: Self-pay | Admitting: Adult Health

## 2012-05-24 ENCOUNTER — Ambulatory Visit (INDEPENDENT_AMBULATORY_CARE_PROVIDER_SITE_OTHER): Payer: PRIVATE HEALTH INSURANCE | Admitting: Adult Health

## 2012-05-24 VITALS — BP 133/86 | HR 66 | Temp 98.8°F | Resp 14 | Ht 71.0 in | Wt 186.0 lb

## 2012-05-24 DIAGNOSIS — F418 Other specified anxiety disorders: Secondary | ICD-10-CM | POA: Insufficient documentation

## 2012-05-24 DIAGNOSIS — F341 Dysthymic disorder: Secondary | ICD-10-CM

## 2012-05-24 DIAGNOSIS — R0789 Other chest pain: Secondary | ICD-10-CM

## 2012-05-24 DIAGNOSIS — R079 Chest pain, unspecified: Secondary | ICD-10-CM

## 2012-05-24 LAB — COMPREHENSIVE METABOLIC PANEL
ALT: 15 U/L (ref 0–53)
AST: 16 U/L (ref 0–37)
Albumin: 4.1 g/dL (ref 3.5–5.2)
CO2: 31 mEq/L (ref 19–32)
Calcium: 9.8 mg/dL (ref 8.4–10.5)
Chloride: 100 mEq/L (ref 96–112)
GFR: 63.92 mL/min (ref >60.00)
Potassium: 3.5 mEq/L (ref 3.5–5.1)
Sodium: 138 mEq/L (ref 135–145)
Total Protein: 7.4 g/dL (ref 6.0–8.3)

## 2012-05-24 MED ORDER — PAROXETINE HCL 20 MG PO TABS: 20.0000 mg | ORAL_TABLET | ORAL | Status: DC

## 2012-05-24 MED ORDER — NITROGLYCERIN 0.4 MG SL SUBL: SUBLINGUAL_TABLET | SUBLINGUAL | Status: DC

## 2012-05-24 NOTE — Progress Notes (Signed)
  Subjective:    Patient ID: Chris Johnson, male    DOB: [birth year], 62 y.o.   MRN: [id number]  HPI  Patient is a pleasant 62 y/o male with a history of HTN, HLD who presents to clinic this morning with c/o intermittent chest pain x 1 week. He reports pain is at epigastric area across to the left "in a straight line". He denies shortness of breath with this episodes. He denies dizziness but has been getting lightheaded. He also reports that he has a hx of getting lightheaded since he was bit by a brown recluse spider years ago. Mr. Ryder has been under considerable amounts of stress at work. He and his wife work for the same company and they have been fearful of loosing their job. As a result, patient is experiencing considerable anxiety & stomach symptoms - decreased appetite, loose stools, "nervous stomach". Patient has multiple risk factors: family hx of heart dz, pt has hyperlipidemia, male > 50, hypertension. Patient has not been taking the atorvastatin 2/2 muscle pain.   Current Outpatient Prescriptions on File Prior to Visit  Medication Sig Dispense Refill  . lisinopril-hydrochlorothiazide (PRINZIDE,ZESTORETIC) 20-25 MG per tablet Take 1 tablet by mouth daily.  90 tablet  4  . Red Yeast Rice Extract (RED YEAST RICE PO) Take by mouth.      Marland Kitchen atorvastatin (LIPITOR) 20 MG tablet Take 1 tablet (20 mg total) by mouth daily.  90 tablet  3  . neomycin-polymyxin-hydrocortisone (CORTISPORIN) 3.5-10000-1 otic suspension Place 4 drops into both ears 2 (two) times daily.  10 mL  0  . zolpidem (AMBIEN CR) 12.5 MG CR tablet Take 1 tablet (12.5 mg total) by mouth at bedtime as needed for sleep.  90 tablet  3   No current facility-administered medications on file prior to visit.     Review of Systems  Constitutional: Positive for appetite change.  Respiratory: Negative for shortness of breath.   Cardiovascular: Positive for chest pain. Negative for palpitations and leg swelling.  Gastrointestinal:        Increased stomach upset secondary to increased stress.  Neurological: Positive for light-headedness. Negative for dizziness and syncope.  Psychiatric/Behavioral: The patient is nervous/anxious.        Depression   BP 133/86  Pulse 66  Temp(Src) 98.8 F (37.1 C) (Oral)  Resp 14  Ht 5\' 11"  (1.803 m)  Wt 186 lb (84.369 kg)  BMI 25.95 kg/m2  SpO2 95%    Objective:   Physical Exam  Constitutional: He is oriented to person, place, and time. He appears well-developed and well-nourished.  Appears nervous, concerned  HENT:  Head: Normocephalic and atraumatic.  Cardiovascular: Normal rate and intact distal pulses.  Exam reveals no gallop.   No murmur heard. Irregular rhythm  Pulmonary/Chest: Effort normal and breath sounds normal. No respiratory distress. He has no wheezes. He has no rales. He exhibits no tenderness.  Abdominal: Soft.  Hyperactive bowel sounds. These could be heard without stethescope  Musculoskeletal: He exhibits no edema.  Neurological: He is alert and oriented to person, place, and time.  Psychiatric: He has a normal mood and affect. His behavior is normal. Judgment and thought content normal.          Assessment & Plan:

## 2012-05-24 NOTE — Assessment & Plan Note (Signed)
EKG done. Shows inverted T waves in leads III and aVF. No acute changes. Recommend cardiology consult for further evaluation and possible stress test. Patient has appointment next Thursday, March 6 with Dr. Elease Hashimoto at [facility] in [place]. Provided patient with prescription for nitroglycerin tablets. Instructed to go to the emergency room if chest pain not relieved with nitroglycerin, shortness of breath or syncope occur.

## 2012-05-24 NOTE — Assessment & Plan Note (Addendum)
Situational anxiety and depression secondary to work stress. Discussed use of an SSRI with patient. Will start Paxil 20 mg daily. Discussed that full benefits of this type of medication takes approximately 3 weeks. Continue to monitor and does adjust as needed

## 2012-05-24 NOTE — Patient Instructions (Addendum)
  Please have your labs done prior to leaving the office.  I will notify you of the results once they are available.  You have an appointment with Dr. Elease Hashimoto at [facility] in [place] on Thursday, March 6th at 11:00 AM  I have ordered nitroglycerin tablets for your chest pain. Place 1 tablet under tongue for chest pain. May repeat every 5 minutes for 3 times. If no relief after the 3rd tablet, please go to the ER  I have started you on Paxil 20 mg daily. This medication takes approximately 3 weeks for full and best effects.

## 2012-05-25 LAB — TROPONIN I: Troponin I: <0.01 ng/mL (ref <0.06)

## 2012-05-30 ENCOUNTER — Ambulatory Visit (INDEPENDENT_AMBULATORY_CARE_PROVIDER_SITE_OTHER): Payer: PRIVATE HEALTH INSURANCE | Admitting: Cardiovascular Disease

## 2012-05-30 ENCOUNTER — Encounter: Payer: Self-pay | Admitting: Cardiovascular Disease

## 2012-05-30 VITALS — BP 110/74 | HR 55 | Ht 71.0 in | Wt 182.2 lb

## 2012-05-30 DIAGNOSIS — K2970 Gastritis, unspecified, without bleeding: Secondary | ICD-10-CM | POA: Insufficient documentation

## 2012-05-30 DIAGNOSIS — K2990 Gastroduodenitis, unspecified, without bleeding: Secondary | ICD-10-CM | POA: Insufficient documentation

## 2012-05-30 DIAGNOSIS — R0789 Other chest pain: Secondary | ICD-10-CM

## 2012-05-30 DIAGNOSIS — R109 Unspecified abdominal pain: Secondary | ICD-10-CM

## 2012-05-30 MED ORDER — OMEPRAZOLE 20 MG PO CPDR: 20.0000 mg | DELAYED_RELEASE_CAPSULE | Freq: Every day | ORAL | Status: DC

## 2012-05-30 MED ORDER — OMEPRAZOLE 20 MG PO CPDR: 20.0000 mg | DELAYED_RELEASE_CAPSULE | Freq: Two times a day (BID) | ORAL | Status: DC

## 2012-05-30 NOTE — Assessment & Plan Note (Addendum)
Chris Johnson presents with upper abdominal pain .    I do not think this is due to a cardiac etiology.  His symptoms are directly related to eating.  These episodes occur after eating and are associated with profound nausea.  He has started taking Prilosec and may be feeling a little better.    He denies any blood in stool .    I would recommend he increase his Prozac to 20 mg twice a day. I discussed his case with Dr. Nadine Counts Buccini.  He has recommended a gallbladder ultrasound. It also is recommended a GI workup with probable endoscopy especially if the pains do not resolve quickly.  I will send him back to Dr. Dan Humphreys this point. I'll see him back on an as-needed basis.

## 2012-05-30 NOTE — Patient Instructions (Addendum)
Follow up with Dr. Elease Hashimoto as needed.  Need to start Prilosec 20 mg take one tablet twice a day.

## 2012-05-30 NOTE — Progress Notes (Signed)
Chris Johnson Date of Birth  09/17/1950       [facility] Office [address]  [address] [place], [place]  [address]   [place], [place]  [address] [phone]     [phone]   Fax  [phone]    Fax [phone]  Problem List: 1. Chest pain 2. Hypertension 3. Hyperlipidemia   History of Present Illness:  Chris Johnson is a 62 yo who presents with upper abdominal pain and chest pain.  He really thinks it started as an abdominal pain. The pain worsens with eating. It is not effected by exercise.  He denies any blood in stool or dark / tarry stools.  He has profound nausea after eating.  He started taking Prilosec last week.   He eats a good diet.    Current Outpatient Prescriptions on File Prior to Visit  Medication Sig Dispense Refill  . lisinopril-hydrochlorothiazide (PRINZIDE,ZESTORETIC) 20-25 MG per tablet Take 1 tablet by mouth daily.  90 tablet  4  . nitroGLYCERIN (NITROSTAT) 0.4 MG SL tablet Place 1 tablet under tongue every 5 minutes (for 3 times only) for chest pain. If no relief after the 3rd tablet please go to the emergency room.  50 tablet  3  . PARoxetine (PAXIL) 20 MG tablet Take 1 tablet (20 mg total) by mouth every morning.  30 tablet  6  . zolpidem (AMBIEN CR) 12.5 MG CR tablet Take 1 tablet (12.5 mg total) by mouth at bedtime as needed for sleep.  90 tablet  3  . atorvastatin (LIPITOR) 20 MG tablet Take 1 tablet (20 mg total) by mouth daily.  90 tablet  3   No current facility-administered medications on file prior to visit.    No Known Allergies  Past Medical History  Diagnosis Date  . Hypertension   . Arthritis   . Normal cardiac stress test 1995    cardiolyte  . Hyperlipidemia     Past Surgical History  Procedure Laterality Date  . Varicose vein surgery    . Colonoscopy      History  Smoking status  . Never Smoker   Smokeless tobacco  . Never Used    History  Alcohol Use  . 2.5  oz/week  . 5 drink(s) per week    Comment: Occasional    Family History  Problem Relation Age of Onset  . Heart attack Mother   . Stroke Mother   . Hypertension Mother   . Skin cancer Mother   . Alcohol abuse Mother   . Prostate cancer Father   . Arthritis Father   . Breast cancer Sister     Reviw of Systems:  Reviewed in the HPI.  All other systems are negative.   Physical Exam: Blood pressure 110/74, pulse 55, height 5\' 11"  (1.803 m), weight 182 lb 4 oz (82.668 kg). General: Well developed, well nourished, in no acute distress.  Head: Normocephalic, atraumatic, sclera non-icteric, mucus membranes are moist,   Neck: Supple. Carotids are 2 + without bruits. No JVD   Lungs: Clear   Heart: RR, normal S1, S2.   Abdomen: Soft, he is mildly tender in his right upper quadrant. He has good bowel sounds. There is no guarding or rebound.  Msk:  Strength and tone are normal   Extremities: No clubbing or cyanosis. No edema.  Distal pedal pulses are 2+ and equal    Neuro: CN II - XII intact.  Alert and oriented X 3.   Psych:  Normal   ECG: May 30, 2012:  Sinus bradycardia at 55. He has occasional premature atrial contractions.  Assessment / Plan:

## 2012-06-19 ENCOUNTER — Encounter: Payer: Self-pay | Admitting: Adult Health

## 2012-06-19 ENCOUNTER — Telehealth: Payer: Self-pay | Admitting: Internal Medicine

## 2012-06-19 ENCOUNTER — Ambulatory Visit (INDEPENDENT_AMBULATORY_CARE_PROVIDER_SITE_OTHER): Payer: PRIVATE HEALTH INSURANCE | Admitting: Adult Health

## 2012-06-19 VITALS — BP 107/70 | HR 65 | Temp 97.8°F | Resp 14 | Ht 71.0 in | Wt 176.0 lb

## 2012-06-19 DIAGNOSIS — K3189 Other diseases of stomach and duodenum: Secondary | ICD-10-CM

## 2012-06-19 DIAGNOSIS — R109 Unspecified abdominal pain: Secondary | ICD-10-CM

## 2012-06-19 DIAGNOSIS — R739 Hyperglycemia, unspecified: Secondary | ICD-10-CM

## 2012-06-19 DIAGNOSIS — R7309 Other abnormal glucose: Secondary | ICD-10-CM

## 2012-06-19 DIAGNOSIS — R1013 Epigastric pain: Secondary | ICD-10-CM

## 2012-06-19 LAB — CBC WITH DIFFERENTIAL/PLATELET
Basophils Relative: 0.8 % (ref 0.0–3.0)
Eosinophils Relative: 2 % (ref 0.0–5.0)
Hemoglobin: 14.7 g/dL (ref 13.0–17.0)
Lymphocytes Relative: 23.4 % (ref 12.0–46.0)
MCHC: 34.4 g/dL (ref 30.0–36.0)
MCV: 85.2 fl (ref 78.0–100.0)
Neutro Abs: 2.6 10*3/uL (ref 1.4–7.7)
Neutrophils Relative %: 63.3 % (ref 43.0–77.0)
RBC: 5.03 Mil/uL (ref 4.22–5.81)
WBC: 4.1 10*3/uL — ABNORMAL LOW (ref 4.5–10.5)

## 2012-06-19 MED ORDER — SUCRALFATE 1 G PO TABS: 1.0000 g | ORAL_TABLET | Freq: Four times a day (QID) | ORAL | Status: DC

## 2012-06-19 NOTE — Progress Notes (Signed)
  Subjective:    Patient ID: Chris Johnson, male    DOB: 10-Nov-1950, 62 y.o.   MRN: [id number]  HPI  Patient is a pleasant 62 y/o gentleman who presents with upper abdominal symptoms: fullness feeling "all the time" and pressure, anorexia, weight loss of 14 lbs within a 4 week period. He reports pain is at epigastric area across to the left. Symptoms are progressing. They are not aggravated with food. Occasional nausea. No vomiting. He denies seeing blood in his stool but reports having mucus occasionally. He is just not feeling good. He has tried probiotics but does not feel any change in symptoms. He has increased his PPI to twice daily.   Current Outpatient Prescriptions on File Prior to Visit  Medication Sig Dispense Refill  . lisinopril-hydrochlorothiazide (PRINZIDE,ZESTORETIC) 20-25 MG per tablet Take 1 tablet by mouth daily.  90 tablet  4  . neomycin-polymyxin-hydrocortisone (CORTISPORIN) 3.5-10000-1 otic suspension Place 4 drops into both ears as needed.      Marland Kitchen omeprazole (PRILOSEC) 20 MG capsule Take 1 capsule (20 mg total) by mouth 2 (two) times daily.  60 capsule  6  . nitroGLYCERIN (NITROSTAT) 0.4 MG SL tablet Place 1 tablet under tongue every 5 minutes (for 3 times only) for chest pain. If no relief after the 3rd tablet please go to the emergency room.  50 tablet  3  . PARoxetine (PAXIL) 20 MG tablet Take 1 tablet (20 mg total) by mouth every morning.  30 tablet  6  . zolpidem (AMBIEN CR) 12.5 MG CR tablet Take 1 tablet (12.5 mg total) by mouth at bedtime as needed for sleep.  90 tablet  3   No current facility-administered medications on file prior to visit.   .  Review of Systems  Constitutional: Positive for appetite change and unexpected weight change.       Decreased appetite, Weight from 190 to 176  Respiratory: Negative.   Cardiovascular: Negative.   Gastrointestinal: Positive for nausea and abdominal pain. Negative for vomiting, blood in stool and anal bleeding.   Constant feeling of fullness.    BP 107/70  Pulse 65  Temp(Src) 97.8 F (36.6 C) (Oral)  Resp 14  Ht 5\' 11"  (1.803 m)  Wt 176 lb (79.833 kg)  BMI 24.56 kg/m2  SpO2 96%    Objective:   Physical Exam  Constitutional: He is oriented to person, place, and time. He appears well-developed and well-nourished. No distress.  Cardiovascular: Normal rate, regular rhythm and normal heart sounds.   No murmur heard. Pulmonary/Chest: Effort normal and breath sounds normal.  Abdominal: Soft. Bowel sounds are normal. He exhibits no distension and no mass. There is no tenderness. There is no rebound and no guarding.  Negative murphy's sign.  Neurological: He is alert and oriented to person, place, and time.  Psychiatric: He has a normal mood and affect. His behavior is normal. Judgment and thought content normal.          Assessment & Plan:

## 2012-06-19 NOTE — Telephone Encounter (Signed)
[facility] called wanting lab results that were drawn this morning. Please fax to [facility] attention [facility] when results are posted.

## 2012-06-19 NOTE — Assessment & Plan Note (Signed)
Abdomen is soft and non tender. Hemoccult positive. Check labs: cbc w/ dif, H pylori. Also checking HgbAIC because fasting glucose was elevated previously and weight loss. Refer to GI

## 2012-06-19 NOTE — Patient Instructions (Addendum)
  Please have your labs drawn:   Cbc w/diff  H. pylori  Hgb A1C - blood glucose  I am starting you on Carafate - you need to take this medication on an empty stomach 1 hours before anything (food, medication) or 2 hours after a meal. You will take this 4 times a day.  I am also referring you to GI - we will call you with appointment

## 2012-06-20 NOTE — Telephone Encounter (Signed)
FAXED VIA EPIC SYSTEM TO [phone]

## 2012-06-24 ENCOUNTER — Ambulatory Visit (INDEPENDENT_AMBULATORY_CARE_PROVIDER_SITE_OTHER): Payer: PRIVATE HEALTH INSURANCE | Admitting: Adult Health

## 2012-06-24 ENCOUNTER — Encounter: Payer: Self-pay | Admitting: Adult Health

## 2012-06-24 ENCOUNTER — Ambulatory Visit: Payer: PRIVATE HEALTH INSURANCE | Admitting: Adult Health

## 2012-06-24 VITALS — BP 122/75 | HR 72 | Temp 97.6°F | Resp 16 | Ht 71.0 in | Wt 177.0 lb

## 2012-06-24 DIAGNOSIS — R109 Unspecified abdominal pain: Secondary | ICD-10-CM

## 2012-06-24 DIAGNOSIS — K921 Melena: Secondary | ICD-10-CM | POA: Insufficient documentation

## 2012-06-24 MED ORDER — RANITIDINE HCL 150 MG PO TABS: 150.0000 mg | ORAL_TABLET | Freq: Two times a day (BID) | ORAL | Status: DC

## 2012-06-24 NOTE — Assessment & Plan Note (Deleted)
Patient is currently on prilosec 20 mg bid, carafate 4 times a day. Referred to GI last week and was seen. Really needs work up which will be done this coming Friday. I have added Zantac 150 mg bid. Suggested he try simethicone for gas bid. Bland diet - no spicy foods or gas producing foods.

## 2012-06-24 NOTE — Patient Instructions (Addendum)
  Take Zantac 150 mg twice daily  Simethicone may help for trapped gas.

## 2012-06-24 NOTE — Progress Notes (Signed)
  Subjective:    Patient ID: Chris Johnson, male    DOB: [birth year], 62 y.o.   MRN: [id number]  HPI  Patient is a pleasant 62 y/o gentleman with hx of HTN, HLD, depression w/ anxiety and recent abdominal symptoms s/p referral to GI who presents to clinic with abdominal discomfort and reports of black tarry stools. I saw him last Thursday for epigastric discomfort, fullness feeling, pressure and anorexia and was able to get him in to see Dr. Bluford Kaufmann that same day. Patient reports that they wanted to scope him that same day but he was scheduled to leave out of town for his job. He is now scheduled for work up this coming Friday. He is having 1 black tarry stool daily since Friday. Patient is requesting "something to treat symptoms" until he is able to have work up done. He has started to take the carafate 4 times daily. He denies pain, hematemesis, fever or chills. He reports increase gas which he is not able to expel.   Recent Labs:  H.pylori - negative Hemoccult positive   Current Outpatient Prescriptions on File Prior to Visit  Medication Sig Dispense Refill  . lisinopril-hydrochlorothiazide (PRINZIDE,ZESTORETIC) 20-25 MG per tablet Take 1 tablet by mouth daily.  90 tablet  4  . omeprazole (PRILOSEC) 20 MG capsule Take 1 capsule (20 mg total) by mouth 2 (two) times daily.  60 capsule  6  . PARoxetine (PAXIL) 20 MG tablet Take 1 tablet (20 mg total) by mouth every morning.  30 tablet  6  . sucralfate (CARAFATE) 1 G tablet Take 1 tablet (1 g total) by mouth 4 (four) times daily.  120 tablet  4  . neomycin-polymyxin-hydrocortisone (CORTISPORIN) 3.5-10000-1 otic suspension Place 4 drops into both ears as needed.      . nitroGLYCERIN (NITROSTAT) 0.4 MG SL tablet Place 1 tablet under tongue every 5 minutes (for 3 times only) for chest pain. If no relief after the 3rd tablet please go to the emergency room.  50 tablet  3  . zolpidem (AMBIEN CR) 12.5 MG CR tablet Take 1 tablet (12.5 mg total) by mouth at  bedtime as needed for sleep.  90 tablet  3   No current facility-administered medications on file prior to visit.     Review of Systems  Constitutional: Negative for fever and chills.  Respiratory: Negative for shortness of breath and wheezing.   Cardiovascular: Negative for chest pain.  Gastrointestinal: Negative for nausea, vomiting, abdominal pain, diarrhea, blood in stool and abdominal distention.       Black, tarry stools. Increased amount of gas. Fullness feeling in stomach  Psychiatric/Behavioral: The patient is nervous/anxious.     BP 122/75  Pulse 72  Temp(Src) 97.6 F (36.4 C) (Oral)  Resp 16  Ht 5\' 11"  (1.803 m)  Wt 177 lb (80.287 kg)  BMI 24.7 kg/m2  SpO2 96%     Objective:   Physical Exam  Constitutional: He is oriented to person, place, and time. He appears well-developed and well-nourished. No distress.  Cardiovascular: Normal rate.   Pulmonary/Chest: Effort normal.  Abdominal: Soft. Bowel sounds are normal. He exhibits no distension and no mass. There is no tenderness. There is no rebound and no guarding.  Neurological: He is alert and oriented to person, place, and time.  Psychiatric: He has a normal mood and affect. His behavior is normal.  Appears nervous, anxious          Assessment & Plan:

## 2012-06-24 NOTE — Assessment & Plan Note (Addendum)
One episode daily since Friday. Patient is currently on prilosec 20 mg bid, carafate 4 times a day. Referred to GI last week and was seen. Really needs work up which will be done this coming Friday. I have added Zantac 150 mg bid. Suggested he try simethicone for gas bid. Bland diet - no spicy foods or gas producing foods. Abdomen is benign on physical exam - soft, non tender, bowel sounds normoactive, no palpable masses, no rebound, no murphy's sign. Check cbc for decrease in h&h. I have also left a message for Alvis Lemmings, NP with Dr. Bluford Kaufmann to discuss these new findings.

## 2012-06-25 ENCOUNTER — Other Ambulatory Visit (INDEPENDENT_AMBULATORY_CARE_PROVIDER_SITE_OTHER): Payer: PRIVATE HEALTH INSURANCE

## 2012-06-25 DIAGNOSIS — E785 Hyperlipidemia, unspecified: Secondary | ICD-10-CM

## 2012-06-25 DIAGNOSIS — K921 Melena: Secondary | ICD-10-CM

## 2012-06-25 LAB — COMPREHENSIVE METABOLIC PANEL
Albumin: 4 g/dL (ref 3.5–5.2)
BUN: 14 mg/dL (ref 6–23)
CO2: 28 mEq/L (ref 19–32)
Calcium: 9.3 mg/dL (ref 8.4–10.5)
GFR: 57.89 mL/min — ABNORMAL LOW (ref >60.00)
Glucose, Bld: 101 mg/dL — ABNORMAL HIGH (ref 70–99)
Potassium: 3.2 mEq/L — ABNORMAL LOW (ref 3.5–5.1)
Sodium: 137 mEq/L (ref 135–145)
Total Protein: 7 g/dL (ref 6.0–8.3)

## 2012-06-25 LAB — LIPID PANEL
Cholesterol: 162 mg/dL (ref 0–200)
HDL: 29.3 mg/dL — ABNORMAL LOW (ref >39.00)
VLDL: 31 mg/dL (ref 0.0–40.0)

## 2012-06-25 LAB — CBC
HCT: 42.1 % (ref 39.0–52.0)
Hemoglobin: 14.2 g/dL (ref 13.0–17.0)
MCV: 86.9 fl (ref 78.0–100.0)
RBC: 4.85 Mil/uL (ref 4.22–5.81)
RDW: 12.6 % (ref 11.5–14.6)

## 2012-07-01 ENCOUNTER — Ambulatory Visit: Payer: Self-pay | Admitting: Gastroenterology

## 2012-07-02 LAB — PATHOLOGY REPORT

## 2012-07-24 ENCOUNTER — Encounter: Payer: Self-pay | Admitting: Internal Medicine

## 2012-09-02 ENCOUNTER — Encounter: Payer: Self-pay | Admitting: Internal Medicine

## 2012-09-03 MED ORDER — ZOLPIDEM TARTRATE ER 12.5 MG PO TBCR: 12.5000 mg | EXTENDED_RELEASE_TABLET | Freq: Every evening | ORAL | Status: DC | PRN

## 2012-09-03 NOTE — Telephone Encounter (Signed)
Rx printed to be signed and faxed to [facility]

## 2012-11-08 ENCOUNTER — Other Ambulatory Visit: Payer: Self-pay

## 2012-11-08 DIAGNOSIS — I10 Essential (primary) hypertension: Secondary | ICD-10-CM

## 2012-11-12 MED ORDER — LISINOPRIL-HYDROCHLOROTHIAZIDE 20-25 MG PO TABS: 1.0000 | ORAL_TABLET | Freq: Every day | ORAL | Status: DC

## 2013-01-06 ENCOUNTER — Ambulatory Visit (INDEPENDENT_AMBULATORY_CARE_PROVIDER_SITE_OTHER): Payer: PRIVATE HEALTH INSURANCE | Admitting: Internal Medicine

## 2013-01-06 ENCOUNTER — Encounter: Payer: Self-pay | Admitting: Internal Medicine

## 2013-01-06 VITALS — BP 128/80 | HR 63 | Temp 97.6°F | Resp 14 | Ht 71.0 in | Wt 177.2 lb

## 2013-01-06 DIAGNOSIS — F341 Dysthymic disorder: Secondary | ICD-10-CM

## 2013-01-06 DIAGNOSIS — Z23 Encounter for immunization: Secondary | ICD-10-CM

## 2013-01-06 DIAGNOSIS — F418 Other specified anxiety disorders: Secondary | ICD-10-CM

## 2013-01-06 DIAGNOSIS — K2970 Gastritis, unspecified, without bleeding: Secondary | ICD-10-CM

## 2013-01-06 MED ORDER — CITALOPRAM HYDROBROMIDE 20 MG PO TABS: 20.0000 mg | ORAL_TABLET | Freq: Every day | ORAL | Status: DC

## 2013-01-06 MED ORDER — OMEPRAZOLE 40 MG PO CPDR: 40.0000 mg | DELAYED_RELEASE_CAPSULE | Freq: Two times a day (BID) | ORAL | Status: DC

## 2013-01-06 MED ORDER — DEXLANSOPRAZOLE 60 MG PO CPDR: 60.0000 mg | DELAYED_RELEASE_CAPSULE | Freq: Every day | ORAL | Status: DC

## 2013-01-06 MED ORDER — CITALOPRAM HYDROBROMIDE 10 MG PO TABS: 10.0000 mg | ORAL_TABLET | Freq: Every day | ORAL | Status: DC

## 2013-01-06 NOTE — Progress Notes (Signed)
Patient ID: Chris Johnson, male   DOB: [birth year], 62 y.o.   MRN: [id number]   Patient Active Problem List   Diagnosis Date Noted  . Black tarry stools 06/24/2012  . Unspecified gastritis and gastroduodenitis without mention of hemorrhage 05/30/2012  . Atypical chest pain 05/24/2012  . Depression with anxiety 05/24/2012  . Skin lesion 02/27/2012  . Hyperlipidemia 02/27/2012  . Hypertension 01/30/2011  . Insomnia 01/30/2011    Subjective:  CC:   Chief Complaint  Patient presents with  . Acute Visit    stress issues    HPI:    Chris Johnson a 62 y.o. male who presents for evaluation of Anxiety.  History of GAD  treated with paxil 20 mg daily starting in February .  He stopped it several months ago , in early summer, felt his sympotms had resolved.  Symptoms have returned.  Aggravating factors include a very stressful job which includes daily travel and at who is very aggressive and hostile. He has 2 young children and both he and his wife work for the same company. Refill financially stressed.They have two young children.. he has daily anxiety and has had occasional panic attacks that are mild. He has been having trouble sleeping. He wakes up frequently and often wakes up at 2 or 3 and cannot go back to sleep.  he is also developed recurrent symptoms of gastritis. He states that he has abdominal pain both with fasting and eating. The pain is below the rib cage. He has tried eliminating acidic beverages and resuming a probiotic but has not resumed a proton pump inhibitor yet.   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 He travels on a daily basis as far [place] as would bridge the gym and sports [place]. His diet when he is on the road is very careful. He avoids fast food and follows a Mediterranean lifestyle   .  Past Medical History  Diagnosis Date  . Hypertension   . Arthritis   . Normal cardiac  stress test 1995    cardiolyte  . Hyperlipidemia     Past Surgical History  Procedure Laterality Date  . Varicose vein surgery    . Colonoscopy         The following portions of the patient's history were reviewed and updated as appropriate: Allergies, current medications, and problem list.    Review of Systems:   12 Pt  review of systems was negative except those addressed in the HPI,     History   Social History  . Marital Status: Married    Spouse Name: N/A    Number of Children: 1  . Years of Education: N/A   Occupational History  . Not on file.   Social History Main Topics  . Smoking status: Never Smoker   . Smokeless tobacco: Never Used  . Alcohol Use: 2.5 oz/week    5 drink(s) per week     Comment: Occasional  . Drug Use: No  . Sexual Activity: Not on file   Other Topics Concern  . Not on file   Social History Narrative   Regular Exercise -  House/yard work 15 acres   Daily Caffeine Use:  1 cup coffee, occasional soda   Lives in [place]. Traveling Sales.             Objective:  Filed Vitals:   01/06/13 1001  BP: 128/80  Pulse: 63  Temp: 97.6 F (36.4 C)  Resp: 14     General appearance: alert, cooperative and appears stated age Ears: normal TM's and external ear canals both ears Throat: lips, mucosa, and tongue normal; teeth and gums normal Neck: no adenopathy, no carotid bruit, supple, symmetrical, trachea midline and thyroid not enlarged, symmetric, no tenderness/mass/nodules Back: symmetric, no curvature. ROM normal. No CVA tenderness. Lungs: clear to auscultation bilaterally Heart: regular rate and rhythm, S1, S2 normal, no murmur, click, rub or gallop Abdomen: soft, non-tender; bowel sounds normal; no masses,  no organomegaly Pulses: 2+ and symmetric Skin: Skin color, texture, turgor normal. No rashes or lesions Lymph nodes: Cervical, supraclavicular, and axillary nodes normal.  Assessment and Plan:  Depression with  anxiety We discussed resuming antidepressant/antianxiety therapy with  SSRI. he had prior improvement with Paxil but found a cause for her visit and is requesting an alternative. We will start with Celexa with an eventual goal 20 mg daily. Instructions given for titration of dose. We decided to avoid anxiolytics at this time since he travels. Return in one to 2 months to follow up with Dr. walker.   Unspecified gastritis and gastroduodenitis without mention of hemorrhage He has a prior history of gastritis which was treated with omeprazole. He has no signs of peptic ulcer on exam and his stools have been brown. We will treat initially with samples of Dexilant 60 mg daily for 10 days following by resuming omeprazole 40 mg daily; proper administration in the morning as discussed. Avoidance of NSAIDs recommended.   Updated Medication List Outpatient Encounter Prescriptions as of 01/06/2013  Medication Sig Dispense Refill  . lisinopril-hydrochlorothiazide (PRINZIDE,ZESTORETIC) 20-25 MG per tablet Take 1 tablet by mouth daily.  90 tablet  0  . neomycin-polymyxin-hydrocortisone (CORTISPORIN) 3.5-10000-1 otic suspension Place 4 drops into both ears as needed.      . citalopram (CELEXA) 10 MG tablet Take 1 tablet (10 mg total) by mouth daily.  7 tablet  0  . citalopram (CELEXA) 20 MG tablet Take 1 tablet (20 mg total) by mouth daily.  90 tablet  1  . dexlansoprazole (DEXILANT) 60 MG capsule Take 1 capsule (60 mg total) by mouth daily.  10 capsule  0  . omeprazole (PRILOSEC) 40 MG capsule Take 1 capsule (40 mg total) by mouth 2 (two) times daily.  90 capsule  1  . ranitidine (ZANTAC) 150 MG tablet Take 1 tablet (150 mg total) by mouth 2 (two) times daily.  60 tablet  2  . sucralfate (CARAFATE) 1 G tablet Take 1 tablet (1 g total) by mouth 4 (four) times daily.  120 tablet  4  . zolpidem (AMBIEN CR) 12.5 MG CR tablet Take 1 tablet (12.5 mg total) by mouth at bedtime as needed for sleep.  90 tablet  1  .  [DISCONTINUED] nitroGLYCERIN (NITROSTAT) 0.4 MG SL tablet Place 1 tablet under tongue every 5 minutes (for 3 times only) for chest pain. If no relief after the 3rd tablet please go to the emergency room.  50 tablet  3  . [DISCONTINUED] omeprazole (PRILOSEC) 20 MG capsule Take 1 capsule (20 mg total) by mouth 2 (two) times daily.  60 capsule  6  . [DISCONTINUED] PARoxetine (PAXIL) 20 MG tablet Take 1 tablet (20 mg total) by mouth every morning.  30 tablet  6   No facility-administered encounter medications on file as of 01/06/2013.     Orders Placed This Encounter  Procedures  . Flu Vaccine QUAD 36+ mos PF IM (  Fluarix)    No Follow-up on file.

## 2013-01-06 NOTE — Patient Instructions (Addendum)
For your anxiety,  Start with 1/2 to 1 citalopram (10 mg tablet) daily.  Increase to 20 mg as needed when your mail order supply of 20 mg tablets arrives  For your gastritis,  Take the samples of Dexilant in the morning 20 minutes prior to food or non water beverage  Finish the Dexilant before you start the mail order omeprazole   Return to Dr Dan Humphreys in 1 to 2 months

## 2013-01-07 ENCOUNTER — Encounter: Payer: Self-pay | Admitting: Internal Medicine

## 2013-01-07 NOTE — Assessment & Plan Note (Addendum)
We discussed resuming antidepressant/antianxiety therapy with  SSRI. he had prior improvement with Paxil but found a cause for her visit and is requesting an alternative. We will start with Celexa with an eventual goal 20 mg daily. Instructions given for titration of dose. We decided to avoid anxiolytics at this time since he travels. Return in one to 2 months to follow up with Dr. walker.

## 2013-01-07 NOTE — Assessment & Plan Note (Signed)
He has a prior history of gastritis which was treated with omeprazole. He has no signs of peptic ulcer on exam and his stools have been brown. We will resume omeprazole 40 mg daily; proper administration in the morning as discussed.

## 2013-01-15 ENCOUNTER — Telehealth: Payer: Self-pay | Admitting: Internal Medicine

## 2013-01-15 ENCOUNTER — Encounter: Payer: Self-pay | Admitting: Internal Medicine

## 2013-01-15 NOTE — Telephone Encounter (Signed)
The patient received his prescription mail and his Ambien was not included in this order he stated that if he did not get his prescription today he would not be able to sleep tonight. He is asking for a 30 day supply of his Ambien to be sent to the [facility] [address] and the rest of his order to be sent to his mail order company.

## 2013-01-15 NOTE — Telephone Encounter (Signed)
Will this be ok, because the medication has a refill on it.

## 2013-01-15 NOTE — Telephone Encounter (Signed)
Fine to call in refill.

## 2013-01-16 MED ORDER — ZOLPIDEM TARTRATE ER 12.5 MG PO TBCR: 12.5000 mg | EXTENDED_RELEASE_TABLET | Freq: Every evening | ORAL | Status: DC | PRN

## 2013-01-16 NOTE — Telephone Encounter (Signed)
30 day supply called in to local pharmacy and 60 day supply printed to signed and faxed.

## 2013-01-30 ENCOUNTER — Other Ambulatory Visit: Payer: Self-pay | Admitting: Internal Medicine

## 2013-01-30 DIAGNOSIS — I10 Essential (primary) hypertension: Secondary | ICD-10-CM

## 2013-01-30 MED ORDER — LISINOPRIL-HYDROCHLOROTHIAZIDE 20-25 MG PO TABS: 1.0000 | ORAL_TABLET | Freq: Every day | ORAL | Status: DC

## 2013-03-02 ENCOUNTER — Other Ambulatory Visit: Payer: Self-pay | Admitting: Internal Medicine

## 2013-03-03 MED ORDER — PAROXETINE HCL 20 MG PO TABS: 20.0000 mg | ORAL_TABLET | ORAL | Status: DC

## 2013-03-03 NOTE — Telephone Encounter (Signed)
Pt sent myChart message, requesting to return to Paroxetine. Ok?

## 2013-03-03 NOTE — Telephone Encounter (Signed)
Fine to refill Paroxetine and stop Citalopram

## 2013-03-08 ENCOUNTER — Other Ambulatory Visit: Payer: Self-pay | Admitting: Internal Medicine

## 2013-03-10 MED ORDER — ZOLPIDEM TARTRATE ER 12.5 MG PO TBCR: 12.5000 mg | EXTENDED_RELEASE_TABLET | Freq: Every evening | ORAL | Status: DC | PRN

## 2013-03-10 NOTE — Telephone Encounter (Signed)
Ok refill to mail order? 

## 2013-05-05 ENCOUNTER — Ambulatory Visit: Payer: PRIVATE HEALTH INSURANCE | Admitting: Internal Medicine

## 2013-05-18 ENCOUNTER — Other Ambulatory Visit: Payer: Self-pay | Admitting: Internal Medicine

## 2013-05-18 DIAGNOSIS — I10 Essential (primary) hypertension: Secondary | ICD-10-CM

## 2013-05-19 NOTE — Telephone Encounter (Signed)
Last non acute visit 12/13, no showed last appt, refill? And see mychart message

## 2013-05-21 MED ORDER — ZOLPIDEM TARTRATE ER 12.5 MG PO TBCR: 12.5000 mg | EXTENDED_RELEASE_TABLET | Freq: Every evening | ORAL | Status: DC | PRN

## 2013-05-21 NOTE — Telephone Encounter (Signed)
We can refill x 1 month, then needs general follow up.

## 2013-05-23 ENCOUNTER — Telehealth: Payer: Self-pay | Admitting: *Deleted

## 2013-05-23 MED ORDER — LISINOPRIL-HYDROCHLOROTHIAZIDE 20-25 MG PO TABS: 1.0000 | ORAL_TABLET | Freq: Every day | ORAL | Status: DC

## 2013-05-23 NOTE — Telephone Encounter (Signed)
Patient left a voicemail about his prescriptions, he had a question about it. Returned patient call and informed his Zolpidem has been sent to [facility] per his request for a 90 day supply. He was just not happy with the response he received in the email.

## 2013-05-23 NOTE — Addendum Note (Signed)
Addended by: Wynonia Lawman E on: 05/23/2013 12:41 PM   Modules accepted: Orders

## 2013-05-29 ENCOUNTER — Other Ambulatory Visit: Payer: Self-pay | Admitting: *Deleted

## 2013-05-29 MED ORDER — SUCRALFATE 1 G PO TABS: 1.0000 g | ORAL_TABLET | Freq: Four times a day (QID) | ORAL | Status: DC

## 2013-06-23 ENCOUNTER — Ambulatory Visit (INDEPENDENT_AMBULATORY_CARE_PROVIDER_SITE_OTHER): Payer: PRIVATE HEALTH INSURANCE | Admitting: Internal Medicine

## 2013-06-23 ENCOUNTER — Encounter: Payer: Self-pay | Admitting: Internal Medicine

## 2013-06-23 VITALS — BP 116/80 | HR 60 | Temp 97.9°F | Wt 173.0 lb

## 2013-06-23 DIAGNOSIS — I10 Essential (primary) hypertension: Secondary | ICD-10-CM

## 2013-06-23 DIAGNOSIS — R634 Abnormal weight loss: Secondary | ICD-10-CM | POA: Insufficient documentation

## 2013-06-23 DIAGNOSIS — F418 Other specified anxiety disorders: Secondary | ICD-10-CM

## 2013-06-23 DIAGNOSIS — F341 Dysthymic disorder: Secondary | ICD-10-CM

## 2013-06-23 DIAGNOSIS — E785 Hyperlipidemia, unspecified: Secondary | ICD-10-CM | POA: Insufficient documentation

## 2013-06-23 DIAGNOSIS — K2990 Gastroduodenitis, unspecified, without bleeding: Secondary | ICD-10-CM

## 2013-06-23 DIAGNOSIS — K2970 Gastritis, unspecified, without bleeding: Secondary | ICD-10-CM

## 2013-06-23 DIAGNOSIS — Z8042 Family history of malignant neoplasm of prostate: Secondary | ICD-10-CM

## 2013-06-23 DIAGNOSIS — L989 Disorder of the skin and subcutaneous tissue, unspecified: Secondary | ICD-10-CM

## 2013-06-23 LAB — COMPREHENSIVE METABOLIC PANEL
ALK PHOS: 51 U/L (ref 39–117)
ALT: 15 U/L (ref 0–53)
AST: 18 U/L (ref 0–37)
Albumin: 4.3 g/dL (ref 3.5–5.2)
BILIRUBIN TOTAL: 1.1 mg/dL (ref 0.3–1.2)
BUN: 17 mg/dL (ref 6–23)
CO2: 29 mEq/L (ref 19–32)
CREATININE: 1.2 mg/dL (ref 0.4–1.5)
Calcium: 10 mg/dL (ref 8.4–10.5)
Chloride: 98 mEq/L (ref 96–112)
GFR: 64.91 mL/min (ref >60.00)
Glucose, Bld: 86 mg/dL (ref 70–99)
Potassium: 3.9 mEq/L (ref 3.5–5.1)
Sodium: 137 mEq/L (ref 135–145)
TOTAL PROTEIN: 7.1 g/dL (ref 6.0–8.3)

## 2013-06-23 LAB — LIPID PANEL
CHOL/HDL RATIO: 5
Cholesterol: 189 mg/dL (ref 0–200)
HDL: 36.2 mg/dL — AB (ref >39.00)
LDL CALC: 128 mg/dL — AB (ref 0–99)
Triglycerides: 122 mg/dL (ref 0.0–149.0)
VLDL: 24.4 mg/dL (ref 0.0–40.0)

## 2013-06-23 LAB — PSA: PSA: 0.72 ng/mL (ref 0.10–4.00)

## 2013-06-23 LAB — TSH: TSH: 1.35 u[IU]/mL (ref 0.35–5.50)

## 2013-06-23 MED ORDER — PANTOPRAZOLE SODIUM 40 MG PO TBEC: 40.0000 mg | DELAYED_RELEASE_TABLET | Freq: Two times a day (BID) | ORAL | Status: DC

## 2013-06-23 MED ORDER — BUSPIRONE HCL 5 MG PO TABS: 5.0000 mg | ORAL_TABLET | Freq: Three times a day (TID) | ORAL | Status: DC

## 2013-06-23 NOTE — Assessment & Plan Note (Signed)
Wt Readings from Last 3 Encounters:  06/23/13 173 lb (78.472 kg)  01/06/13 177 lb 4 oz (80.4 kg)  06/24/12 177 lb (80.287 kg)   Recent weight loss attributed to poor appetite with recent gastritis. Will monitor closely.

## 2013-06-23 NOTE — Progress Notes (Signed)
Pre visit review using our clinic review tool, if applicable. No additional management support is needed unless otherwise documented below in the visit note. 

## 2013-06-23 NOTE — Assessment & Plan Note (Signed)
Several dry skin lesions over the scalp most consistent with early actinic keratosis. Will schedule followup with dermatology.

## 2013-06-23 NOTE — Assessment & Plan Note (Signed)
Recent gastritis confirmed on endoscopy. Symptoms improved but not resolved with omeprazole. Will start pantoprazole 40 mg twice daily. Followup in 2-4 weeks

## 2013-06-23 NOTE — Progress Notes (Signed)
Subjective:    Patient ID: Chris Johnson, male    DOB: Jul 12, 1950, 63 y.o.   MRN: [id number]  HPI 63YO male presents for follow up.  He continues to struggle with anxiety related to his work situation. He reports a great deal of uncertainty about his stop. He has been taking Paxil no improvement in symptoms. He would like to try an alternative medication.  He also notes continued symptoms of epigastric discomfort however he reports that this has improved from 7/10 in intensity to approximately 3/10 in intensity. He has been taking omeprazole daily.questions whether there may be more effective medication. He had upper endoscopy which confirmed gastritis. He is no longer taking sucralfate. His appetite has improved somewhat.  Review of Systems  Constitutional: Positive for unexpected weight change. Negative for fever, chills, activity change, appetite change and fatigue.  Eyes: Negative for visual disturbance.  Respiratory: Negative for cough and shortness of breath.   Cardiovascular: Negative for chest pain, palpitations and leg swelling.  Gastrointestinal: Positive for abdominal pain. Negative for nausea, vomiting, diarrhea, constipation, blood in stool, abdominal distention, anal bleeding and rectal pain.  Genitourinary: Negative for dysuria, urgency and difficulty urinating.  Musculoskeletal: Negative for arthralgias and gait problem.  Skin: Negative for color change and rash.  Hematological: Negative for adenopathy.  Psychiatric/Behavioral: Positive for sleep disturbance. Negative for dysphoric mood. The patient is nervous/anxious.        Objective:    BP 116/80  Pulse 60  Temp(Src) 97.9 F (36.6 C) (Oral)  Wt 173 lb (78.472 kg)  SpO2 95% Physical Exam  Constitutional: He is oriented to person, place, and time. He appears well-developed and well-nourished. No distress.  HENT:  Head: Normocephalic and atraumatic.  Right Ear: External ear normal.  Left Ear: External ear  normal.  Nose: Nose normal.  Mouth/Throat: Oropharynx is clear and moist. No oropharyngeal exudate.  Eyes: Conjunctivae and EOM are normal. Pupils are equal, round, and reactive to light. Right eye exhibits no discharge. Left eye exhibits no discharge. No scleral icterus.  Neck: Normal range of motion. Neck supple. No tracheal deviation present. No thyromegaly present.  Cardiovascular: Normal rate, regular rhythm and normal heart sounds.  Exam reveals no gallop and no friction rub.   No murmur heard. Pulmonary/Chest: Effort normal and breath sounds normal. No accessory muscle usage. Not tachypneic. No respiratory distress. He has no decreased breath sounds. He has no wheezes. He has no rhonchi. He has no rales. He exhibits no tenderness.  Abdominal: Soft. Bowel sounds are normal. He exhibits no distension and no mass. There is no tenderness. There is no rebound and no guarding.  Musculoskeletal: Normal range of motion. He exhibits no edema.  Lymphadenopathy:    He has no cervical adenopathy.  Neurological: He is alert and oriented to person, place, and time. No cranial nerve deficit. Coordination normal.  Skin: Skin is warm and dry. No rash noted. He is not diaphoretic. No erythema. No pallor.  Psychiatric: His speech is normal and behavior is normal. Judgment and thought content normal. His mood appears anxious. He exhibits a depressed mood. He expresses no suicidal ideation.          Assessment & Plan:   Problem List Items Addressed This Visit   Depression with anxiety - Primary     Worsening symptoms of anxiety and depressed mood. No improvement with Paxil. Failure with multiple SSRIs in the past. Given that main component is described as anxiety, will try using Buspar. We also  discussed benzodiazepines but opted to hold off on these medications given that he travels frequently. Followup in 2-4 weeks.    Relevant Medications      busPIRone (BUSPAR) tablet   Family history of prostate  cancer     Will check PSA with labs today.    Relevant Orders      PSA   Hypertension      BP Readings from Last 3 Encounters:  06/23/13 116/80  01/06/13 128/80  06/24/12 122/75   Blood pressure well-controlled on current medications. Will check renal function with labs today.    Relevant Orders      Comprehensive metabolic panel   Loss of weight      Wt Readings from Last 3 Encounters:  06/23/13 173 lb (78.472 kg)  01/06/13 177 lb 4 oz (80.4 kg)  06/24/12 177 lb (80.287 kg)   Recent weight loss attributed to poor appetite with recent gastritis. Will monitor closely.    Relevant Orders      CBC w/Diff      TSH   Other and unspecified hyperlipidemia     Will check lipids and LFTs with labs today.    Relevant Orders      Lipid panel   Skin lesion      Several dry skin lesions over the scalp most consistent with early actinic keratosis. Will schedule followup with dermatology.    Unspecified gastritis and gastroduodenitis without mention of hemorrhage     Recent gastritis confirmed on endoscopy. Symptoms improved but not resolved with omeprazole. Will start pantoprazole 40 mg twice daily. Followup in 2-4 weeks    Relevant Medications      pantoprazole (PROTONIX) EC tablet       Return in about 4 weeks (around 07/21/2013) for Recheck.

## 2013-06-23 NOTE — Assessment & Plan Note (Signed)
Will check PSA with labs today. 

## 2013-06-23 NOTE — Assessment & Plan Note (Signed)
BP Readings from Last 3 Encounters:  06/23/13 116/80  01/06/13 128/80  06/24/12 122/75   Blood pressure well-controlled on current medications. Will check renal function with labs today.

## 2013-06-23 NOTE — Assessment & Plan Note (Signed)
Will check lipids and LFTs with labs today. 

## 2013-06-23 NOTE — Assessment & Plan Note (Signed)
Worsening symptoms of anxiety and depressed mood. No improvement with Paxil. Failure with multiple SSRIs in the past. Given that main component is described as anxiety, will try using Buspar. We also discussed benzodiazepines but opted to hold off on these medications given that he travels frequently. Followup in 2-4 weeks.

## 2013-06-24 ENCOUNTER — Telehealth: Payer: Self-pay | Admitting: Internal Medicine

## 2013-06-24 LAB — CBC WITH DIFFERENTIAL/PLATELET
BASOS ABS: 0 10*3/uL (ref 0.0–0.1)
Basophils Relative: 0.2 % (ref 0.0–3.0)
EOS PCT: 1.9 % (ref 0.0–5.0)
Eosinophils Absolute: 0.1 10*3/uL (ref 0.0–0.7)
HEMATOCRIT: 44.2 % (ref 39.0–52.0)
Hemoglobin: 15 g/dL (ref 13.0–17.0)
LYMPHS ABS: 1.3 10*3/uL (ref 0.7–4.0)
LYMPHS PCT: 19.7 % (ref 12.0–46.0)
MCHC: 34.1 g/dL (ref 30.0–36.0)
MCV: 86.1 fl (ref 78.0–100.0)
MONOS PCT: 4.7 % (ref 3.0–12.0)
Monocytes Absolute: 0.3 10*3/uL (ref 0.1–1.0)
NEUTROS PCT: 73.5 % (ref 43.0–77.0)
Neutro Abs: 4.8 10*3/uL (ref 1.4–7.7)
PLATELETS: 275 10*3/uL (ref 150.0–400.0)
RBC: 5.13 Mil/uL (ref 4.22–5.81)
RDW: 13 % (ref 11.5–14.6)
WBC: 6.6 10*3/uL (ref 4.5–10.5)

## 2013-06-24 NOTE — Telephone Encounter (Signed)
Relevant patient education assigned to patient using Emmi. ° °

## 2013-07-07 ENCOUNTER — Encounter: Payer: Self-pay | Admitting: Internal Medicine

## 2013-07-28 ENCOUNTER — Ambulatory Visit: Payer: PRIVATE HEALTH INSURANCE | Admitting: Internal Medicine

## 2013-08-19 ENCOUNTER — Other Ambulatory Visit: Payer: Self-pay | Admitting: *Deleted

## 2013-08-19 DIAGNOSIS — I10 Essential (primary) hypertension: Secondary | ICD-10-CM

## 2013-08-20 MED ORDER — LISINOPRIL-HYDROCHLOROTHIAZIDE 20-25 MG PO TABS: 1.0000 | ORAL_TABLET | Freq: Every day | ORAL | Status: DC

## 2013-08-20 NOTE — Telephone Encounter (Deleted)
Last visit 06/23/13

## 2013-09-17 ENCOUNTER — Telehealth: Payer: Self-pay | Admitting: Internal Medicine

## 2013-09-17 MED ORDER — ZOLPIDEM TARTRATE ER 12.5 MG PO TBCR: 12.5000 mg | EXTENDED_RELEASE_TABLET | Freq: Every evening | ORAL | Status: DC | PRN

## 2013-09-17 NOTE — Telephone Encounter (Signed)
Fine to refill for one month, then needs visit.

## 2013-09-17 NOTE — Telephone Encounter (Signed)
Last OV 3.30.15.  Please advise refill.

## 2013-09-17 NOTE — Telephone Encounter (Signed)
zolpidem (AMBIEN CR) 12.5 MG CR tablet 

## 2013-10-12 ENCOUNTER — Other Ambulatory Visit: Payer: Self-pay | Admitting: Internal Medicine

## 2013-10-13 MED ORDER — ZOLPIDEM TARTRATE ER 12.5 MG PO TBCR
12.5000 mg | EXTENDED_RELEASE_TABLET | Freq: Every evening | ORAL | Status: DC | PRN
Start: 2013-10-13 — End: 2013-12-25

## 2013-10-13 NOTE — Telephone Encounter (Signed)
See message.

## 2013-10-13 NOTE — Telephone Encounter (Signed)
Rx called to pharmacy

## 2013-12-24 ENCOUNTER — Other Ambulatory Visit: Payer: Self-pay | Admitting: Internal Medicine

## 2013-12-25 MED ORDER — ZOLPIDEM TARTRATE ER 12.5 MG PO TBCR: 12.5000 mg | EXTENDED_RELEASE_TABLET | Freq: Every evening | ORAL | Status: DC | PRN

## 2013-12-25 NOTE — Telephone Encounter (Signed)
Last visit 06/23/13, refill? 

## 2013-12-25 NOTE — Telephone Encounter (Signed)
Rx faxed

## 2014-01-19 ENCOUNTER — Encounter: Payer: Self-pay | Admitting: Internal Medicine

## 2014-01-20 ENCOUNTER — Other Ambulatory Visit: Payer: Self-pay | Admitting: *Deleted

## 2014-01-20 MED ORDER — LISINOPRIL-HYDROCHLOROTHIAZIDE 20-25 MG PO TABS: 1.0000 | ORAL_TABLET | Freq: Every day | ORAL | Status: DC

## 2014-01-23 ENCOUNTER — Ambulatory Visit: Payer: PRIVATE HEALTH INSURANCE | Admitting: Internal Medicine

## 2014-01-26 ENCOUNTER — Ambulatory Visit: Payer: PRIVATE HEALTH INSURANCE | Admitting: Internal Medicine

## 2014-04-06 ENCOUNTER — Ambulatory Visit (INDEPENDENT_AMBULATORY_CARE_PROVIDER_SITE_OTHER): Payer: PRIVATE HEALTH INSURANCE | Admitting: Internal Medicine

## 2014-04-06 ENCOUNTER — Encounter: Payer: Self-pay | Admitting: Internal Medicine

## 2014-04-06 ENCOUNTER — Ambulatory Visit (INDEPENDENT_AMBULATORY_CARE_PROVIDER_SITE_OTHER): Payer: PRIVATE HEALTH INSURANCE | Admitting: *Deleted

## 2014-04-06 VITALS — BP 129/76 | HR 71 | Temp 97.7°F | Ht 71.0 in | Wt 184.0 lb

## 2014-04-06 DIAGNOSIS — I10 Essential (primary) hypertension: Secondary | ICD-10-CM

## 2014-04-06 DIAGNOSIS — G4700 Insomnia, unspecified: Secondary | ICD-10-CM

## 2014-04-06 DIAGNOSIS — Z23 Encounter for immunization: Secondary | ICD-10-CM

## 2014-04-06 DIAGNOSIS — J309 Allergic rhinitis, unspecified: Secondary | ICD-10-CM

## 2014-04-06 LAB — COMPREHENSIVE METABOLIC PANEL
ALT: 14 U/L (ref 0–53)
AST: 16 U/L (ref 0–37)
Albumin: 4.1 g/dL (ref 3.5–5.2)
Alkaline Phosphatase: 55 U/L (ref 39–117)
BUN: 25 mg/dL — ABNORMAL HIGH (ref 6–23)
CALCIUM: 9.3 mg/dL (ref 8.4–10.5)
CO2: 29 mEq/L (ref 19–32)
Chloride: 103 mEq/L (ref 96–112)
Creatinine, Ser: 1.3 mg/dL (ref 0.4–1.5)
GFR: 60.67 mL/min (ref >60.00)
Glucose, Bld: 98 mg/dL (ref 70–99)
Potassium: 3.6 mEq/L (ref 3.5–5.1)
SODIUM: 139 meq/L (ref 135–145)
Total Bilirubin: 0.7 mg/dL (ref 0.2–1.2)
Total Protein: 7.7 g/dL (ref 6.0–8.3)

## 2014-04-06 MED ORDER — LISINOPRIL-HYDROCHLOROTHIAZIDE 20-25 MG PO TABS
1.0000 | ORAL_TABLET | Freq: Every day | ORAL | Status: DC
Start: 2014-04-06 — End: 2014-04-06

## 2014-04-06 MED ORDER — ZOLPIDEM TARTRATE ER 12.5 MG PO TBCR: 12.5000 mg | EXTENDED_RELEASE_TABLET | Freq: Every evening | ORAL | Status: DC | PRN

## 2014-04-06 MED ORDER — LISINOPRIL-HYDROCHLOROTHIAZIDE 20-25 MG PO TABS: 1.0000 | ORAL_TABLET | Freq: Every day | ORAL | Status: DC

## 2014-04-06 NOTE — Patient Instructions (Addendum)
Labs today.  Follow up in 6 months. 

## 2014-04-06 NOTE — Progress Notes (Signed)
Subjective:    Patient ID: Chris Johnson, male    DOB: April 18, 1950, 65 y.o.   MRN: [id number]  HPI 64YO male presents for follow up.  Notes morning cough, watery eyes since November when he stopped taking Benadryl at night. No dyspnea, chest pain. Notes some watery eyes and sneezing. Symptoms made worse by outdoor activity such as mowing his yard.  Compliant with meds. No other concerns.  Wt Readings from Last 3 Encounters:  04/06/14 184 lb (83.462 kg)  06/23/13 173 lb (78.472 kg)  01/06/13 177 lb 4 oz (80.4 kg)     Past medical, surgical, family and social history per today's encounter.  Review of Systems  Constitutional: Negative for fever, chills, activity change, appetite change, fatigue and unexpected weight change.  HENT: Positive for postnasal drip, rhinorrhea and sneezing. Negative for congestion, sinus pressure, trouble swallowing and voice change.   Eyes: Positive for discharge. Negative for visual disturbance.  Respiratory: Positive for cough. Negative for chest tightness, shortness of breath, wheezing and stridor.   Cardiovascular: Negative for chest pain, palpitations and leg swelling.  Gastrointestinal: Negative for abdominal pain and abdominal distention.  Genitourinary: Negative for dysuria, urgency and difficulty urinating.  Musculoskeletal: Negative for arthralgias and gait problem.  Skin: Negative for color change and rash.  Hematological: Negative for adenopathy.  Psychiatric/Behavioral: Negative for sleep disturbance and dysphoric mood. The patient is not nervous/anxious.        Objective:    BP 129/76 mmHg  Pulse 71  Temp(Src) 97.7 F (36.5 C) (Oral)  Ht '5\' 11"'  (1.803 m)  Wt 184 lb (83.462 kg)  BMI 25.67 kg/m2  SpO2 97% Physical Exam  Constitutional: He is oriented to person, place, and time. He appears well-developed and well-nourished. No distress.  HENT:  Head: Normocephalic and atraumatic.  Right Ear: External ear normal.  Left Ear:  External ear normal.  Nose: Nose normal.  Mouth/Throat: Oropharynx is clear and moist. No oropharyngeal exudate.  Eyes: Conjunctivae and EOM are normal. Pupils are equal, round, and reactive to light. Right eye exhibits no discharge. Left eye exhibits no discharge. No scleral icterus.  Neck: Normal range of motion. Neck supple. No tracheal deviation present. No thyromegaly present.  Cardiovascular: Normal rate, regular rhythm and normal heart sounds.  Exam reveals no gallop and no friction rub.   No murmur heard. Pulmonary/Chest: Effort normal and breath sounds normal. No accessory muscle usage. No tachypnea. No respiratory distress. He has no decreased breath sounds. He has no wheezes. He has no rhonchi. He has no rales. He exhibits no tenderness.  Musculoskeletal: Normal range of motion. He exhibits no edema.  Lymphadenopathy:    He has no cervical adenopathy.  Neurological: He is alert and oriented to person, place, and time. No cranial nerve deficit. Coordination normal.  Skin: Skin is warm and dry. No rash noted. He is not diaphoretic. No erythema. No pallor.  Psychiatric: He has a normal mood and affect. His behavior is normal. Judgment and thought content normal.          Assessment & Plan:   Problem List Items Addressed This Visit      Unprioritized   Allergic rhinitis    Morning cough likely related to allergic rhinitis. Will resume Benadryl at bedtime and consider prn Claritin during the day. Follow up prn if symptoms not improving. Discussed referral for allergy testing and CXR if symptoms persistent.    Hypertension - Primary    BP Readings from Last 3 Encounters:  04/06/14 129/76  06/23/13 116/80  01/06/13 128/80   BP well controlled on current medications. Renal function with labs.     Relevant Medications      lisinopril-hydrochlorothiazide (PRINZIDE,ZESTORETIC) 20-25 MG per tablet   Other Relevant Orders      Comp Met (CMET)   Insomnia    Symptoms well  controlled with Ambien. Will continue.        Return in about 6 months (around 10/05/2014) for Physical.

## 2014-04-06 NOTE — Assessment & Plan Note (Signed)
BP Readings from Last 3 Encounters:  04/06/14 129/76  06/23/13 116/80  01/06/13 128/80   BP well controlled on current medications. Renal function with labs.

## 2014-04-06 NOTE — Assessment & Plan Note (Addendum)
Morning cough likely related to allergic rhinitis. Will resume Benadryl at bedtime and consider prn Claritin during the day. Follow up prn if symptoms not improving. Discussed referral for allergy testing and CXR if symptoms persistent.

## 2014-04-06 NOTE — Assessment & Plan Note (Signed)
Symptoms well controlled with Ambien. Will continue. 

## 2014-09-20 ENCOUNTER — Encounter: Payer: Self-pay | Admitting: Internal Medicine

## 2014-09-21 ENCOUNTER — Other Ambulatory Visit: Payer: Self-pay | Admitting: *Deleted

## 2014-09-21 ENCOUNTER — Other Ambulatory Visit: Payer: Self-pay

## 2014-09-21 MED ORDER — ZOLPIDEM TARTRATE ER 12.5 MG PO TBCR: 12.5000 mg | EXTENDED_RELEASE_TABLET | Freq: Every evening | ORAL | Status: DC | PRN

## 2014-09-21 MED ORDER — LISINOPRIL-HYDROCHLOROTHIAZIDE 20-25 MG PO TABS
1.0000 | ORAL_TABLET | Freq: Every day | ORAL | Status: DC
Start: 2014-09-21 — End: 2014-09-21

## 2014-09-21 MED ORDER — LISINOPRIL-HYDROCHLOROTHIAZIDE 20-25 MG PO TABS: 1.0000 | ORAL_TABLET | Freq: Every day | ORAL | Status: DC

## 2014-09-21 NOTE — Telephone Encounter (Signed)
Faxed to pharmacy

## 2014-09-21 NOTE — Telephone Encounter (Signed)
Pt requested a 90 day supply to [facility] order, only a 30 day supply was printed by [facility], ok to refill #90 of Zolpidem?

## 2014-09-21 NOTE — Progress Notes (Signed)
See mychart refill encounter, refilled incorrectly at first.

## 2014-09-23 ENCOUNTER — Encounter: Payer: Self-pay | Admitting: Internal Medicine

## 2014-10-12 ENCOUNTER — Encounter: Payer: Self-pay | Admitting: Internal Medicine

## 2014-10-12 ENCOUNTER — Ambulatory Visit (INDEPENDENT_AMBULATORY_CARE_PROVIDER_SITE_OTHER): Payer: PRIVATE HEALTH INSURANCE | Admitting: Internal Medicine

## 2014-10-12 VITALS — BP 109/71 | HR 71 | Temp 98.3°F | Ht 71.0 in | Wt 186.2 lb

## 2014-10-12 DIAGNOSIS — Z0000 Encounter for general adult medical examination without abnormal findings: Secondary | ICD-10-CM

## 2014-10-12 DIAGNOSIS — E785 Hyperlipidemia, unspecified: Secondary | ICD-10-CM

## 2014-10-12 DIAGNOSIS — I10 Essential (primary) hypertension: Secondary | ICD-10-CM

## 2014-10-12 LAB — LIPID PANEL
Cholesterol: 239 mg/dL — ABNORMAL HIGH (ref 0–200)
HDL: 38.3 mg/dL — ABNORMAL LOW (ref >39.00)
NonHDL: 200.7
Total CHOL/HDL Ratio: 6
Triglycerides: 308 mg/dL — ABNORMAL HIGH (ref 0.0–149.0)
VLDL: 61.6 mg/dL — AB (ref 0.0–40.0)

## 2014-10-12 LAB — COMPREHENSIVE METABOLIC PANEL
ALT: 14 U/L (ref 0–53)
AST: 14 U/L (ref 0–37)
Albumin: 4.2 g/dL (ref 3.5–5.2)
Alkaline Phosphatase: 50 U/L (ref 39–117)
BUN: 18 mg/dL (ref 6–23)
CALCIUM: 10 mg/dL (ref 8.4–10.5)
CO2: 31 mEq/L (ref 19–32)
CREATININE: 1.28 mg/dL (ref 0.40–1.50)
Chloride: 100 mEq/L (ref 96–112)
GFR: 60.57 mL/min (ref >60.00)
Glucose, Bld: 95 mg/dL (ref 70–99)
POTASSIUM: 3.7 meq/L (ref 3.5–5.1)
SODIUM: 141 meq/L (ref 135–145)
TOTAL PROTEIN: 7.1 g/dL (ref 6.0–8.3)
Total Bilirubin: 0.6 mg/dL (ref 0.2–1.2)

## 2014-10-12 LAB — CBC WITH DIFFERENTIAL/PLATELET
BASOS ABS: 0 10*3/uL (ref 0.0–0.1)
Basophils Relative: 0.7 % (ref 0.0–3.0)
EOS ABS: 0.3 10*3/uL (ref 0.0–0.7)
Eosinophils Relative: 4.4 % (ref 0.0–5.0)
HEMATOCRIT: 44.7 % (ref 39.0–52.0)
Hemoglobin: 15.5 g/dL (ref 13.0–17.0)
LYMPHS PCT: 23.2 % (ref 12.0–46.0)
Lymphs Abs: 1.5 10*3/uL (ref 0.7–4.0)
MCHC: 34.7 g/dL (ref 30.0–36.0)
MCV: 86 fl (ref 78.0–100.0)
MONO ABS: 0.5 10*3/uL (ref 0.1–1.0)
MONOS PCT: 7.8 % (ref 3.0–12.0)
NEUTROS ABS: 4.2 10*3/uL (ref 1.4–7.7)
Neutrophils Relative %: 63.9 % (ref 43.0–77.0)
Platelets: 245 10*3/uL (ref 150.0–400.0)
RBC: 5.2 Mil/uL (ref 4.22–5.81)
RDW: 12.9 % (ref 11.5–15.5)
WBC: 6.6 10*3/uL (ref 4.0–10.5)

## 2014-10-12 LAB — PSA: PSA: 0.94 ng/mL (ref 0.10–4.00)

## 2014-10-12 LAB — LDL CHOLESTEROL, DIRECT: Direct LDL: 137 mg/dL

## 2014-10-12 MED ORDER — ZOLPIDEM TARTRATE ER 12.5 MG PO TBCR: 12.5000 mg | EXTENDED_RELEASE_TABLET | Freq: Every evening | ORAL | Status: DC | PRN

## 2014-10-12 NOTE — Progress Notes (Signed)
Pre visit review using our clinic review tool, if applicable. No additional management support is needed unless otherwise documented below in the visit note. 

## 2014-10-12 NOTE — Progress Notes (Signed)
   Subjective:    Patient ID: Chris Johnson, male    DOB: May 16, 1950, 64 y.o.   MRN: [id number]  HPI  64YO male presents for physical exam.  Feeling well. Followed by Dr. Nehemiah Massed for recurrent precancerous lesions of scalp. Continues to travel some for work. Compliant with medication.  Past medical, surgical, family and social history per today's encounter.  Review of Systems  Constitutional: Negative for fever, chills, activity change, appetite change, fatigue and unexpected weight change.  Eyes: Negative for visual disturbance.  Respiratory: Negative for cough and shortness of breath.   Cardiovascular: Negative for chest pain, palpitations and leg swelling.  Gastrointestinal: Negative for nausea, vomiting, abdominal pain, diarrhea, constipation and abdominal distention.  Genitourinary: Negative for dysuria, urgency and difficulty urinating.  Musculoskeletal: Negative for arthralgias and gait problem.  Skin: Negative for color change and rash.  Hematological: Negative for adenopathy.  Psychiatric/Behavioral: Negative for sleep disturbance and dysphoric mood. The patient is not nervous/anxious.        Objective:    BP 109/71 mmHg  Pulse 71  Temp(Src) 98.3 F (36.8 C) (Oral)  Ht 5\' 11"  (1.803 m)  Wt 186 lb 4 oz (84.482 kg)  BMI 25.99 kg/m2  SpO2 95% Physical Exam  Constitutional: He is oriented to person, place, and time. He appears well-developed and well-nourished. No distress.  HENT:  Head: Normocephalic and atraumatic.  Right Ear: External ear normal.  Left Ear: External ear normal.  Nose: Nose normal.  Mouth/Throat: Oropharynx is clear and moist. No oropharyngeal exudate.  Eyes: Conjunctivae and EOM are normal. Pupils are equal, round, and reactive to light. Right eye exhibits no discharge. Left eye exhibits no discharge. No scleral icterus.  Neck: Normal range of motion. Neck supple. No tracheal deviation present. No thyromegaly present.  Cardiovascular: Normal  rate, regular rhythm and normal heart sounds.  Exam reveals no gallop and no friction rub.   No murmur heard. Pulmonary/Chest: Effort normal and breath sounds normal. No accessory muscle usage. No tachypnea. No respiratory distress. He has no decreased breath sounds. He has no wheezes. He has no rhonchi. He has no rales. He exhibits no tenderness.  Abdominal: Soft. Bowel sounds are normal. He exhibits no distension and no mass. There is no tenderness. There is no rebound and no guarding.  Musculoskeletal: Normal range of motion. He exhibits no edema.  Lymphadenopathy:    He has no cervical adenopathy.  Neurological: He is alert and oriented to person, place, and time. No cranial nerve deficit. Coordination normal.  Skin: Skin is warm and dry. No rash noted. He is not diaphoretic. No erythema. No pallor.  Psychiatric: He has a normal mood and affect. His behavior is normal. Judgment and thought content normal.          Assessment & Plan:   Problem List Items Addressed This Visit      Unprioritized   Hyperlipidemia   Hypertension   Routine general medical examination at a health care facility - Primary    General medical exam normal today. Encouraged healthy diet and exercise. Encouraged him to get Zostavax at a local pharmacy. Colonoscopy UTD. Labs today as ordered.      Relevant Orders   PSA   CBC with Differential/Platelet   Comprehensive metabolic panel   Lipid panel       Return in about 6 months (around 04/14/2015) for Recheck.

## 2014-10-12 NOTE — Patient Instructions (Addendum)
Consider using Elta MD sunscreen.  Health Maintenance A healthy lifestyle and preventative care can promote health and wellness.  Maintain regular health, dental, and eye exams.  Eat a healthy diet. Foods like vegetables, fruits, whole grains, low-fat dairy products, and lean protein foods contain the nutrients you need and are low in calories. Decrease your intake of foods high in solid fats, added sugars, and salt. Get information about a proper diet from your health care provider, if necessary.  Regular physical exercise is one of the most important things you can do for your health. Most adults should get at least 150 minutes of moderate-intensity exercise (any activity that increases your heart rate and causes you to sweat) each week. In addition, most adults need muscle-strengthening exercises on 2 or more days a week.   Maintain a healthy weight. The body mass index (BMI) is a screening tool to identify possible weight problems. It provides an estimate of body fat based on height and weight. Your health care provider can find your BMI and can help you achieve or maintain a healthy weight. For males 20 years and older:  A BMI below 18.5 is considered underweight.  A BMI of 18.5 to 24.9 is normal.  A BMI of 25 to 29.9 is considered overweight.  A BMI of 30 and above is considered obese.  Maintain normal blood lipids and cholesterol by exercising and minimizing your intake of saturated fat. Eat a balanced diet with plenty of fruits and vegetables. Blood tests for lipids and cholesterol should begin at age 67 and be repeated every 5 years. If your lipid or cholesterol levels are high, you are over age 24, or you are at high risk for heart disease, you may need your cholesterol levels checked more frequently.Ongoing high lipid and cholesterol levels should be treated with medicines if diet and exercise are not working.  If you smoke, find out from your health care provider how to quit. If  you do not use tobacco, do not start.  Lung cancer screening is recommended for adults aged 45-80 years who are at high risk for developing lung cancer because of a history of smoking. A yearly low-dose CT scan of the lungs is recommended for people who have at least a 30-pack-year history of smoking and are current smokers or have quit within the past 15 years. A pack year of smoking is smoking an average of 1 pack of cigarettes a day for 1 year (for example, a 30-pack-year history of smoking could mean smoking 1 pack a day for 30 years or 2 packs a day for 15 years). Yearly screening should continue until the smoker has stopped smoking for at least 15 years. Yearly screening should be stopped for people who develop a health problem that would prevent them from having lung cancer treatment.  If you choose to drink alcohol, do not have more than 2 drinks per day. One drink is considered to be 12 oz (360 mL) of beer, 5 oz (150 mL) of wine, or 1.5 oz (45 mL) of liquor.  Avoid the use of street drugs. Do not share needles with anyone. Ask for help if you need support or instructions about stopping the use of drugs.  High blood pressure causes heart disease and increases the risk of stroke. Blood pressure should be checked at least every 1-2 years. Ongoing high blood pressure should be treated with medicines if weight loss and exercise are not effective.  If you are 7-55 years old,  ask your health care provider if you should take aspirin to prevent heart disease.  Diabetes screening involves taking a blood sample to check your fasting blood sugar level. This should be done once every 3 years after age 45 if you are at a normal weight and without risk factors for diabetes. Testing should be considered at a younger age or be carried out more frequently if you are overweight and have at least 1 risk factor for diabetes.  Colorectal cancer can be detected and often prevented. Most routine colorectal cancer  screening begins at the age of 50 and continues through age 75. However, your health care provider may recommend screening at an earlier age if you have risk factors for colon cancer. On a yearly basis, your health care provider may provide home test kits to check for hidden blood in the stool. A small camera at the end of a tube may be used to directly examine the colon (sigmoidoscopy or colonoscopy) to detect the earliest forms of colorectal cancer. Talk to your health care provider about this at age 50 when routine screening begins. A direct exam of the colon should be repeated every 5-10 years through age 75, unless early forms of precancerous polyps or small growths are found.  People who are at an increased risk for hepatitis B should be screened for this virus. You are considered at high risk for hepatitis B if:  You were born in a country where hepatitis B occurs often. Talk with your health care provider about which countries are considered high risk.  Your parents were born in a high-risk country and you have not received a shot to protect against hepatitis B (hepatitis B vaccine).  You have HIV or AIDS.  You use needles to inject street drugs.  You live with, or have sex with, someone who has hepatitis B.  You are a man who has sex with other men (MSM).  You get hemodialysis treatment.  You take certain medicines for conditions like cancer, organ transplantation, and autoimmune conditions.  Hepatitis C blood testing is recommended for all people born from 1945 through 1965 and any individual with known risk factors for hepatitis C.  Healthy men should no longer receive prostate-specific antigen (PSA) blood tests as part of routine cancer screening. Talk to your health care provider about prostate cancer screening.  Testicular cancer screening is not recommended for adolescents or adult males who have no symptoms. Screening includes self-exam, a health care provider exam, and other  screening tests. Consult with your health care provider about any symptoms you have or any concerns you have about testicular cancer.  Practice safe sex. Use condoms and avoid high-risk sexual practices to reduce the spread of sexually transmitted infections (STIs).  You should be screened for STIs, including gonorrhea and chlamydia if:  You are sexually active and are younger than 24 years.  You are older than 24 years, and your health care provider tells you that you are at risk for this type of infection.  Your sexual activity has changed since you were last screened, and you are at an increased risk for chlamydia or gonorrhea. Ask your health care provider if you are at risk.  If you are at risk of being infected with HIV, it is recommended that you take a prescription medicine daily to prevent HIV infection. This is called pre-exposure prophylaxis (PrEP). You are considered at risk if:  You are a man who has sex with other men (MSM).    You are a heterosexual man who is sexually active with multiple partners.  You take drugs by injection.  You are sexually active with a partner who has HIV.  Talk with your health care provider about whether you are at high risk of being infected with HIV. If you choose to begin PrEP, you should first be tested for HIV. You should then be tested every 3 months for as long as you are taking PrEP.  Use sunscreen. Apply sunscreen liberally and repeatedly throughout the day. You should seek shade when your shadow is shorter than you. Protect yourself by wearing long sleeves, pants, a wide-brimmed hat, and sunglasses year round whenever you are outdoors.  Tell your health care provider of new moles or changes in moles, especially if there is a change in shape or color. Also, tell your health care provider if a mole is larger than the size of a pencil eraser.  A one-time screening for abdominal aortic aneurysm (AAA) and surgical repair of large AAAs by  ultrasound is recommended for men aged 19-75 years who are current or former smokers.  Stay current with your vaccines (immunizations). Document Released: 09/09/2007 Document Revised: 03/18/2013 Document Reviewed: 08/08/2010 [facility] Patient Information 2015 [place], [place]. This information is not intended to replace advice given to you by your health care provider. Make sure you discuss any questions you have with your health care provider.

## 2014-10-12 NOTE — Assessment & Plan Note (Addendum)
General medical exam normal today. Encouraged healthy diet and exercise. Encouraged him to get Zostavax at a local pharmacy. Colonoscopy UTD. Labs today as ordered.

## 2015-05-09 ENCOUNTER — Encounter: Payer: Self-pay | Admitting: Internal Medicine

## 2015-05-10 ENCOUNTER — Other Ambulatory Visit: Payer: Self-pay | Admitting: *Deleted

## 2015-05-10 MED ORDER — ZOLPIDEM TARTRATE ER 12.5 MG PO TBCR: 12.5000 mg | EXTENDED_RELEASE_TABLET | Freq: Every evening | ORAL | Status: DC | PRN

## 2015-11-18 ENCOUNTER — Telehealth: Payer: Self-pay | Admitting: *Deleted

## 2015-11-18 NOTE — Telephone Encounter (Signed)
Patient has requested to have a medication refill  [facility] delivery

## 2015-11-18 NOTE — Telephone Encounter (Signed)
Refill request for Ambien, last seen [id number], last filled [id number].  Please advise.

## 2015-11-19 ENCOUNTER — Other Ambulatory Visit: Payer: Self-pay | Admitting: Family Medicine

## 2015-11-19 MED ORDER — ZOLPIDEM TARTRATE ER 12.5 MG PO TBCR: 12.5000 mg | EXTENDED_RELEASE_TABLET | Freq: Every evening | ORAL | 0 refills | Status: DC | PRN

## 2015-11-19 NOTE — Telephone Encounter (Signed)
Patient has been informed and have scheduled appointment on [place]:[id number] @ 1430

## 2015-11-19 NOTE — Telephone Encounter (Signed)
Refilling x 30 days. Needs to establish.

## 2015-11-23 ENCOUNTER — Other Ambulatory Visit: Payer: Self-pay | Admitting: Family Medicine

## 2015-11-23 MED ORDER — LISINOPRIL-HYDROCHLOROTHIAZIDE 20-25 MG PO TABS: 1.0000 | ORAL_TABLET | Freq: Every day | ORAL | 1 refills | Status: DC

## 2015-11-23 NOTE — Telephone Encounter (Signed)
Medication last refilled by Dr.Walker on  09/21/14 for 90-days with 1 refill. Pt has a scheduled appointment on 12/14/15. Please advise?

## 2015-12-14 ENCOUNTER — Encounter: Payer: Self-pay | Admitting: Family Medicine

## 2015-12-14 ENCOUNTER — Ambulatory Visit (INDEPENDENT_AMBULATORY_CARE_PROVIDER_SITE_OTHER): Payer: PRIVATE HEALTH INSURANCE | Admitting: Family Medicine

## 2015-12-14 VITALS — BP 120/84 | HR 66 | Temp 98.1°F | Wt 182.4 lb

## 2015-12-14 DIAGNOSIS — Z125 Encounter for screening for malignant neoplasm of prostate: Secondary | ICD-10-CM | POA: Diagnosis not present

## 2015-12-14 DIAGNOSIS — Z130 Encounter for screening for diseases of the blood and blood-forming organs and certain disorders involving the immune mechanism: Secondary | ICD-10-CM

## 2015-12-14 DIAGNOSIS — I10 Essential (primary) hypertension: Secondary | ICD-10-CM

## 2015-12-14 DIAGNOSIS — Z0000 Encounter for general adult medical examination without abnormal findings: Secondary | ICD-10-CM

## 2015-12-14 DIAGNOSIS — E785 Hyperlipidemia, unspecified: Secondary | ICD-10-CM | POA: Diagnosis not present

## 2015-12-14 LAB — LDL CHOLESTEROL, DIRECT: Direct LDL: 144 mg/dL

## 2015-12-14 LAB — LIPID PANEL
CHOLESTEROL: 238 mg/dL — AB (ref 0–200)
HDL: 38.8 mg/dL — AB (ref >39.00)
NonHDL: 199.4
TRIGLYCERIDES: 288 mg/dL — AB (ref 0.0–149.0)
Total CHOL/HDL Ratio: 6
VLDL: 57.6 mg/dL — ABNORMAL HIGH (ref 0.0–40.0)

## 2015-12-14 LAB — COMPREHENSIVE METABOLIC PANEL
ALK PHOS: 50 U/L (ref 39–117)
ALT: 12 U/L (ref 0–53)
AST: 14 U/L (ref 0–37)
Albumin: 4.1 g/dL (ref 3.5–5.2)
BILIRUBIN TOTAL: 0.6 mg/dL (ref 0.2–1.2)
BUN: 18 mg/dL (ref 6–23)
CALCIUM: 9.5 mg/dL (ref 8.4–10.5)
CO2: 32 mEq/L (ref 19–32)
Chloride: 101 mEq/L (ref 96–112)
Creatinine, Ser: 1.23 mg/dL (ref 0.40–1.50)
GFR: 63.17 mL/min (ref >60.00)
Glucose, Bld: 91 mg/dL (ref 70–99)
POTASSIUM: 3.5 meq/L (ref 3.5–5.1)
Sodium: 139 mEq/L (ref 135–145)
TOTAL PROTEIN: 7.1 g/dL (ref 6.0–8.3)

## 2015-12-14 LAB — CBC
HCT: 44.4 % (ref 39.0–52.0)
Hemoglobin: 15.3 g/dL (ref 13.0–17.0)
MCHC: 34.4 g/dL (ref 30.0–36.0)
MCV: 86.3 fl (ref 78.0–100.0)
PLATELETS: 262 10*3/uL (ref 150.0–400.0)
RBC: 5.14 Mil/uL (ref 4.22–5.81)
RDW: 12.6 % (ref 11.5–15.5)
WBC: 6.6 10*3/uL (ref 4.0–10.5)

## 2015-12-14 LAB — PSA: PSA: 1.53 ng/mL (ref 0.10–4.00)

## 2015-12-14 MED ORDER — ZOLPIDEM TARTRATE ER 12.5 MG PO TBCR: 12.5000 mg | EXTENDED_RELEASE_TABLET | Freq: Every evening | ORAL | 1 refills | Status: DC | PRN

## 2015-12-14 MED ORDER — ZOSTER VACCINE LIVE 19400 UNT/0.65ML ~~LOC~~ SUSR: 0.6500 mL | Freq: Once | SUBCUTANEOUS | 0 refills | Status: DC

## 2015-12-14 MED ORDER — ZOSTER VACCINE LIVE 19400 UNT/0.65ML ~~LOC~~ SUSR: 0.6500 mL | Freq: Once | SUBCUTANEOUS | 0 refills | Status: AC

## 2015-12-14 NOTE — Assessment & Plan Note (Signed)
Screening labs today - see orders. Patient declined treatment of cholesterol with statin.  Rx for zostavax given. Tdap up to date.  Declines flu. Declines Hep c and HIV screening.

## 2015-12-14 NOTE — Patient Instructions (Addendum)
Follow up Annually.  Take care  Dr. Lacinda Axon   Health Maintenance, Male A healthy lifestyle and preventative care can promote health and wellness.  Maintain regular health, dental, and eye exams.  Eat a healthy diet. Foods like vegetables, fruits, whole grains, low-fat dairy products, and lean protein foods contain the nutrients you need and are low in calories. Decrease your intake of foods high in solid fats, added sugars, and salt. Get information about a proper diet from your health care provider, if necessary.  Regular physical exercise is one of the most important things you can do for your health. Most adults should get at least 150 minutes of moderate-intensity exercise (any activity that increases your heart rate and causes you to sweat) each week. In addition, most adults need muscle-strengthening exercises on 2 or more days a week.   Maintain a healthy weight. The body mass index (BMI) is a screening tool to identify possible weight problems. It provides an estimate of body fat based on height and weight. Your health care provider can find your BMI and can help you achieve or maintain a healthy weight. For males 20 years and older:  A BMI below 18.5 is considered underweight.  A BMI of 18.5 to 24.9 is normal.  A BMI of 25 to 29.9 is considered overweight.  A BMI of 30 and above is considered obese.  Maintain normal blood lipids and cholesterol by exercising and minimizing your intake of saturated fat. Eat a balanced diet with plenty of fruits and vegetables. Blood tests for lipids and cholesterol should begin at age 31 and be repeated every 5 years. If your lipid or cholesterol levels are high, you are over age 19, or you are at high risk for heart disease, you may need your cholesterol levels checked more frequently.Ongoing high lipid and cholesterol levels should be treated with medicines if diet and exercise are not working.  If you smoke, find out from your health care  provider how to quit. If you do not use tobacco, do not start.  Lung cancer screening is recommended for adults aged 75-80 years who are at high risk for developing lung cancer because of a history of smoking. A yearly low-dose CT scan of the lungs is recommended for people who have at least a 30-pack-year history of smoking and are current smokers or have quit within the past 15 years. A pack year of smoking is smoking an average of 1 pack of cigarettes a day for 1 year (for example, a 30-pack-year history of smoking could mean smoking 1 pack a day for 30 years or 2 packs a day for 15 years). Yearly screening should continue until the smoker has stopped smoking for at least 15 years. Yearly screening should be stopped for people who develop a health problem that would prevent them from having lung cancer treatment.  If you choose to drink alcohol, do not have more than 2 drinks per day. One drink is considered to be 12 oz (360 mL) of beer, 5 oz (150 mL) of wine, or 1.5 oz (45 mL) of liquor.  Avoid the use of street drugs. Do not share needles with anyone. Ask for help if you need support or instructions about stopping the use of drugs.  High blood pressure causes heart disease and increases the risk of stroke. High blood pressure is more likely to develop in:  People who have blood pressure in the end of the normal range (100-139/85-89 mm Hg).  People who  are overweight or obese.  People who are African American.  If you are 39-92 years of age, have your blood pressure checked every 3-5 years. If you are 65 years of age or older, have your blood pressure checked every year. You should have your blood pressure measured twice--once when you are at a hospital or clinic, and once when you are not at a hospital or clinic. Record the average of the two measurements. To check your blood pressure when you are not at a hospital or clinic, you can use:  An automated blood pressure machine at a  pharmacy.  A home blood pressure monitor.  If you are 95-27 years old, ask your health care provider if you should take aspirin to prevent heart disease.  Diabetes screening involves taking a blood sample to check your fasting blood sugar level. This should be done once every 3 years after age 51 if you are at a normal weight and without risk factors for diabetes. Testing should be considered at a younger age or be carried out more frequently if you are overweight and have at least 1 risk factor for diabetes.  Colorectal cancer can be detected and often prevented. Most routine colorectal cancer screening begins at the age of 28 and continues through age 54. However, your health care provider may recommend screening at an earlier age if you have risk factors for colon cancer. On a yearly basis, your health care provider may provide home test kits to check for hidden blood in the stool. A small camera at the end of a tube may be used to directly examine the colon (sigmoidoscopy or colonoscopy) to detect the earliest forms of colorectal cancer. Talk to your health care provider about this at age 20 when routine screening begins. A direct exam of the colon should be repeated every 5-10 years through age 57, unless early forms of precancerous polyps or small growths are found.  People who are at an increased risk for hepatitis B should be screened for this virus. You are considered at high risk for hepatitis B if:  You were born in a country where hepatitis B occurs often. Talk with your health care provider about which countries are considered high risk.  Your parents were born in a high-risk country and you have not received a shot to protect against hepatitis B (hepatitis B vaccine).  You have HIV or AIDS.  You use needles to inject street drugs.  You live with, or have sex with, someone who has hepatitis B.  You are a man who has sex with other men (MSM).  You get hemodialysis  treatment.  You take certain medicines for conditions like cancer, organ transplantation, and autoimmune conditions.  Hepatitis C blood testing is recommended for all people born from [birth year] through 1963 and any individual with known risk factors for hepatitis C.  Healthy men should no longer receive prostate-specific antigen (PSA) blood tests as part of routine cancer screening. Talk to your health care provider about prostate cancer screening.  Testicular cancer screening is not recommended for adolescents or adult males who have no symptoms. Screening includes self-exam, a health care provider exam, and other screening tests. Consult with your health care provider about any symptoms you have or any concerns you have about testicular cancer.  Practice safe sex. Use condoms and avoid high-risk sexual practices to reduce the spread of sexually transmitted infections (STIs).  You should be screened for STIs, including gonorrhea and chlamydia if:  You  are sexually active and are younger than 24 years.  You are older than 24 years, and your health care provider tells you that you are at risk for this type of infection.  Your sexual activity has changed since you were last screened, and you are at an increased risk for chlamydia or gonorrhea. Ask your health care provider if you are at risk.  If you are at risk of being infected with HIV, it is recommended that you take a prescription medicine daily to prevent HIV infection. This is called pre-exposure prophylaxis (PrEP). You are considered at risk if:  You are a man who has sex with other men (MSM).  You are a heterosexual man who is sexually active with multiple partners.  You take drugs by injection.  You are sexually active with a partner who has HIV.  Talk with your health care provider about whether you are at high risk of being infected with HIV. If you choose to begin PrEP, you should first be tested for HIV. You should then be tested  every 3 months for as long as you are taking PrEP.  Use sunscreen. Apply sunscreen liberally and repeatedly throughout the day. You should seek shade when your shadow is shorter than you. Protect yourself by wearing long sleeves, pants, a wide-brimmed hat, and sunglasses year round whenever you are outdoors.  Tell your health care provider of new moles or changes in moles, especially if there is a change in shape or color. Also, tell your health care provider if a mole is larger than the size of a pencil eraser.  A one-time screening for abdominal aortic aneurysm (AAA) and surgical repair of large AAAs by ultrasound is recommended for men aged 29-75 years who are current or former smokers.  Stay current with your vaccines (immunizations).   This information is not intended to replace advice given to you by your health care provider. Make sure you discuss any questions you have with your health care provider.   Document Released: 09/09/2007 Document Revised: 04/03/2014 Document Reviewed: 08/08/2010 Elsevier Interactive Patient Education [employer].

## 2015-12-14 NOTE — Progress Notes (Signed)
Subjective:  Patient ID: Chris Johnson, male    DOB: 19-May-1950  Age: 65 y.o. MRN: [id number]  CC: Annual physical  HPI Chris Johnson is a 65 y.o. male with HTN, Depression/anxiety, HLD, Insomnia presents for an annual Physical exam.  Preventative Healthcare  Colonoscopy: Up to date.  Immunizations  Tetanus - Within last 10 year per report.  Pneumococcal - N/A.  Flu - Declines.  Zoster - Would like to get.  Prostate cancer screening: Screening PSA today.  Hepatitis C screening - Declines.  Labs: See orders.  STD/HIV testing: Declines.  Regular dental exams: Yes.   PMH, Surgical Hx, Family Hx, Social History reviewed and updated as below.  Past Medical History:  Diagnosis Date  . Arthritis   . Hyperlipidemia   . Hypertension   . Normal cardiac stress test 1995   cardiolyte   Past Surgical History:  Procedure Laterality Date  . colonoscopy    . VARICOSE VEIN SURGERY     Family History  Problem Relation Age of Onset  . Heart attack Mother   . Stroke Mother   . Hypertension Mother   . Skin cancer Mother   . Alcohol abuse Mother   . Prostate cancer Father   . Arthritis Father   . Breast cancer Sister    Social History  Substance Use Topics  . Smoking status: Never Smoker  . Smokeless tobacco: Never Used  . Alcohol use 2.5 oz/week    5 drink(s) per week     Comment: Occasional   Review of Systems  Musculoskeletal:       Calf pain.  All other systems reviewed and are negative.  Objective:   Today's Vitals: BP 120/84 (BP Location: Right Arm, Patient Position: Sitting, Cuff Size: Normal)   Pulse 66   Temp 98.1 F (36.7 C) (Oral)   Wt 182 lb 6 oz (82.7 kg)   SpO2 93%   BMI 25.44 kg/m   Physical Exam  Constitutional: He is oriented to person, place, and time. He appears well-developed and well-nourished. No distress.  HENT:  Head: Normocephalic and atraumatic.  Nose: Nose normal.  Mouth/Throat: Oropharynx is clear and moist. No  oropharyngeal exudate.  Normal TM's bilaterally.   Eyes: Conjunctivae are normal. No scleral icterus.  Neck: Neck supple. No thyromegaly present.  Cardiovascular: Normal rate and regular rhythm.   No murmur heard. Pulmonary/Chest: Effort normal and breath sounds normal. He has no wheezes. He has no rales.  Abdominal: Soft. He exhibits no distension. There is no tenderness. There is no rebound and no guarding.  Musculoskeletal:  Calf muscles tender to palpation.  Lymphadenopathy:    He has no cervical adenopathy.  Neurological: He is alert and oriented to person, place, and time.  Skin: Skin is warm and dry. No rash noted.  Psychiatric: He has a normal mood and affect.  Vitals reviewed.  Assessment & Plan:   Problem List Items Addressed This Visit    Annual physical exam - Primary    Screening labs today - see orders. Patient declined treatment of cholesterol with statin.  Rx for zostavax given. Tdap up to date.  Declines flu. Declines Hep c and HIV screening.      Hyperlipidemia   Relevant Orders   Lipid panel   Hypertension   Relevant Orders   Comp Met (CMET)    Other Visit Diagnoses    Screening for prostate cancer       Relevant Orders   PSA   Screening for  deficiency anemia       Relevant Orders   CBC      Outpatient Encounter Prescriptions as of 12/14/2015  Medication Sig  . lisinopril-hydrochlorothiazide (PRINZIDE,ZESTORETIC) 20-25 MG tablet Take 1 tablet by mouth daily.  Marland Kitchen zolpidem (AMBIEN CR) 12.5 MG CR tablet Take 1 tablet (12.5 mg total) by mouth at bedtime as needed for sleep.  . [DISCONTINUED] neomycin-polymyxin-hydrocortisone (CORTISPORIN) 3.5-10000-1 otic suspension INSTILL 4 DROPS INTO THE RIGHT EAR BID  . [DISCONTINUED] zolpidem (AMBIEN CR) 12.5 MG CR tablet Take 1 tablet (12.5 mg total) by mouth at bedtime as needed for sleep.  . [DISCONTINUED] zolpidem (AMBIEN CR) 12.5 MG CR tablet Take 1 tablet (12.5 mg total) by mouth at bedtime as needed for  sleep.  Marland Kitchen Zoster Vaccine Live, PF, (ZOSTAVAX) 67011 UNT/0.65ML injection Inject 19,400 Units into the skin once.  . [DISCONTINUED] Zoster Vaccine Live, PF, (ZOSTAVAX) 00349 UNT/0.65ML injection Inject 19,400 Units into the skin once.   No facility-administered encounter medications on file as of 12/14/2015.     Follow-up: Return in about 1 year (around 12/13/2016).  Lamar

## 2015-12-14 NOTE — Progress Notes (Signed)
Pre visit review using our clinic review tool, if applicable. No additional management support is needed unless otherwise documented below in the visit note. 

## 2016-05-12 ENCOUNTER — Encounter: Payer: Self-pay | Admitting: Family Medicine

## 2016-05-12 ENCOUNTER — Other Ambulatory Visit: Payer: Self-pay | Admitting: Family Medicine

## 2016-05-12 NOTE — Telephone Encounter (Signed)
This is a sleep medication. It cannot be taken twice daily and I will not write it that way.

## 2016-05-12 NOTE — Telephone Encounter (Signed)
Refilled 11/2015. Pt last seen 12/14/15. Pt is asking for the rx to read twice daily. pelase advise?

## 2016-05-15 MED ORDER — ZOLPIDEM TARTRATE ER 12.5 MG PO TBCR: 12.5000 mg | EXTENDED_RELEASE_TABLET | Freq: Every evening | ORAL | 1 refills | Status: DC | PRN

## 2016-09-25 ENCOUNTER — Telehealth: Payer: Self-pay | Admitting: *Deleted

## 2016-09-25 NOTE — Telephone Encounter (Signed)
[facility] home delivery has requested to have a refill for lisinopril

## 2016-09-26 MED ORDER — LISINOPRIL-HYDROCHLOROTHIAZIDE 20-25 MG PO TABS: 1.0000 | ORAL_TABLET | Freq: Every day | ORAL | 1 refills | Status: DC

## 2016-09-26 NOTE — Telephone Encounter (Signed)
rx sent

## 2016-10-03 ENCOUNTER — Ambulatory Visit
Admission: RE | Admit: 2016-10-03 | Discharge: 2016-10-03 | Disposition: A | Discharge status: Home / Self Care | Payer: PRIVATE HEALTH INSURANCE | Source: Ambulatory Visit | Attending: Family Medicine | Admitting: Family Medicine

## 2016-10-03 ENCOUNTER — Ambulatory Visit (INDEPENDENT_AMBULATORY_CARE_PROVIDER_SITE_OTHER): Payer: PRIVATE HEALTH INSURANCE | Admitting: Family Medicine

## 2016-10-03 ENCOUNTER — Telehealth: Payer: Self-pay | Admitting: *Deleted

## 2016-10-03 ENCOUNTER — Encounter: Payer: Self-pay | Admitting: Family Medicine

## 2016-10-03 VITALS — BP 128/84 | HR 59 | Temp 98.3°F | Wt 186.4 lb

## 2016-10-03 DIAGNOSIS — R0989 Other specified symptoms and signs involving the circulatory and respiratory systems: Secondary | ICD-10-CM | POA: Insufficient documentation

## 2016-10-03 DIAGNOSIS — R05 Cough: Secondary | ICD-10-CM

## 2016-10-03 DIAGNOSIS — R059 Cough, unspecified: Secondary | ICD-10-CM

## 2016-10-03 DIAGNOSIS — R062 Wheezing: Secondary | ICD-10-CM | POA: Insufficient documentation

## 2016-10-03 DIAGNOSIS — R918 Other nonspecific abnormal finding of lung field: Secondary | ICD-10-CM | POA: Insufficient documentation

## 2016-10-03 MED ORDER — AZITHROMYCIN 250 MG PO TABS: ORAL_TABLET | ORAL | 0 refills | Status: DC

## 2016-10-03 NOTE — Telephone Encounter (Signed)
Patient notified of medication will be sent into [place] for him to start his treatment. LMOM for patient

## 2016-10-03 NOTE — Patient Instructions (Signed)
Nice to see you. Please go get the chest x-ray. Once you have this done we will determine what to treat you with.  If you develop cough productive of blood, shortness of breath, fevers, or any new or changing symptoms please seek medical attention.

## 2016-10-03 NOTE — Progress Notes (Signed)
  Tommi Rumps, MD Phone: [phone]  Chris Johnson is a 66 y.o. male who presents today for same-day visit.  Patient notes 1 month of cough and chest congestion. Has had intermittent sinus congestion as well. Recently started wheezing a little when lying down. Notes that it is a little harder to breathe when he lays down though has no trouble breathing when he sits up. Coughing green mucus up. Was treated for strep one month ago with amoxicillin. He's had no fevers.  PMH: nonsmoker.   ROS see history of present illness  Objective  Physical Exam Vitals:   10/03/16 1527  BP: 128/84  Pulse: (!) 59  Temp: 98.3 F (36.8 C)    BP Readings from Last 3 Encounters:  10/03/16 128/84  12/14/15 120/84  10/12/14 109/71   Wt Readings from Last 3 Encounters:  10/03/16 186 lb 6.4 oz (84.6 kg)  12/14/15 182 lb 6 oz (82.7 kg)  10/12/14 186 lb 4 oz (84.5 kg)    Physical Exam  Constitutional: No distress.  HENT:  Head: Normocephalic and atraumatic.  Mouth/Throat: Oropharynx is clear and moist. No oropharyngeal exudate.  Eyes: Conjunctivae are normal. Pupils are equal, round, and reactive to light.  Cardiovascular: Normal rate, regular rhythm and normal heart sounds.   Pulmonary/Chest: Effort normal. No respiratory distress. He has wheezes (scattered intermittent faint wheezes). He has no rales.  Slightly coarse breath sounds  Neurological: He is alert. Gait normal.  Skin: He is not diaphoretic.     Assessment/Plan: Please see individual problem list.  Cough Suspect related to bronchitis and likely sinus infection as well. Given lung sounds and persisting cough will get a chest x-ray. We'll determine if he needs treatment for pneumonia or just bronchitis based on x-ray results. Given return precautions.  Chest x-ray to radiology read not back yet. Possible bronchitis versus pneumonia. We'll treat with azithromycin. This was sent to the patient's pharmacy.   Orders Placed  This Encounter  Procedures  . DG Chest 2 View    Standing Status:   Future    Number of Occurrences:   1    Standing Expiration Date:   12/04/2017    Order Specific Question:   Reason for Exam (SYMPTOM  OR DIAGNOSIS REQUIRED)    Answer:   cough, chest congestion, wheezing    Order Specific Question:   Preferred imaging location?    Answer:   [facility]    Order Specific Question:   Radiology Contrast Protocol - do NOT remove file path    Answer:   [email or web address]    Tommi Rumps, MD [place]

## 2016-10-03 NOTE — Assessment & Plan Note (Addendum)
Suspect related to bronchitis and likely sinus infection as well. Given lung sounds and persisting cough will get a chest x-ray. We'll determine if he needs treatment for pneumonia or just bronchitis based on x-ray results. Given return precautions.  Chest x-ray to radiology read not back yet. Possible bronchitis versus pneumonia. We'll treat with azithromycin. This was sent to the patient's pharmacy.

## 2016-10-03 NOTE — Telephone Encounter (Signed)
Patient requested to have any scripts from today sent to [facility] on [address]

## 2016-10-03 NOTE — Telephone Encounter (Signed)
Azithromycin sent to pharmacy

## 2016-11-08 ENCOUNTER — Encounter: Payer: Self-pay | Admitting: Family Medicine

## 2016-11-09 MED ORDER — LISINOPRIL-HYDROCHLOROTHIAZIDE 20-25 MG PO TABS: 1.0000 | ORAL_TABLET | Freq: Every day | ORAL | 1 refills | Status: DC

## 2016-11-09 MED ORDER — ZOLPIDEM TARTRATE ER 12.5 MG PO TBCR: 12.5000 mg | EXTENDED_RELEASE_TABLET | Freq: Every evening | ORAL | 1 refills | Status: DC | PRN

## 2016-12-12 ENCOUNTER — Telehealth: Payer: Self-pay

## 2016-12-12 ENCOUNTER — Telehealth: Payer: Self-pay | Admitting: *Deleted

## 2016-12-12 DIAGNOSIS — E785 Hyperlipidemia, unspecified: Secondary | ICD-10-CM

## 2016-12-12 NOTE — Telephone Encounter (Signed)
-----   Message from Minna Merritts, MD sent at 12/12/2016  1:21 PM EDT ----- Regarding: screening Can you help me order CT coronary calcium score in [place] for hyperlipidemia, family history  Also need carotid ultrasound through our office Can we order the $50 screening that is not run-through insurance Reason for screen is hyperlipidemia  thx TG

## 2016-12-12 NOTE — Telephone Encounter (Signed)
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   They recommend patient have carotid doppler done they prefer PCP to scheduled .  PCP to follow . Looking through chart it looks like cardiology maybe scheduling ?

## 2016-12-12 NOTE — Telephone Encounter (Signed)
Spoke with patient and scheduled him for carotid screening for 12/20/16 at 3pm. He was not able to write down number for CT Cardiac scoring and requested that I call back and leave that number on his voicemail. Called back and left number on voicemail for him to call and schedule that test. He was appreciative for the call and had no further questions at this time. Orders entered for testing. Left our number to call back if he has any further questions.

## 2016-12-13 ENCOUNTER — Ambulatory Visit (INDEPENDENT_AMBULATORY_CARE_PROVIDER_SITE_OTHER)
Admission: RE | Admit: 2016-12-13 | Discharge: 2016-12-13 | Disposition: A | Discharge status: Home / Self Care | Payer: Self-pay | Source: Ambulatory Visit | Attending: Cardiovascular Disease | Admitting: Cardiovascular Disease

## 2016-12-13 ENCOUNTER — Ambulatory Visit: Payer: Managed Care, Other (non HMO)

## 2016-12-13 ENCOUNTER — Other Ambulatory Visit: Payer: Self-pay | Admitting: Family Medicine

## 2016-12-13 DIAGNOSIS — H349 Unspecified retinal vascular occlusion: Secondary | ICD-10-CM | POA: Diagnosis not present

## 2016-12-13 DIAGNOSIS — E785 Hyperlipidemia, unspecified: Secondary | ICD-10-CM

## 2016-12-13 LAB — VAS US CAROTID
LCCADDIAS: -43 cm/s
LCCADSYS: -146 cm/s
LEFT ECA DIAS: -25 cm/s
LEFT VERTEBRAL DIAS: 9 cm/s
LICADSYS: -95 cm/s
Left CCA prox dias: 34 cm/s
Left CCA prox sys: 109 cm/s
Left ICA dist dias: -38 cm/s
Left ICA prox dias: -27 cm/s
Left ICA prox sys: -76 cm/s
RCCADSYS: -117 cm/s
RCCAPSYS: 98 cm/s
RIGHT ECA DIAS: -27 cm/s
RIGHT VERTEBRAL DIAS: -21 cm/s
Right CCA prox dias: 27 cm/s

## 2016-12-15 ENCOUNTER — Telehealth: Payer: Self-pay | Admitting: *Deleted

## 2016-12-15 ENCOUNTER — Other Ambulatory Visit: Payer: Self-pay | Admitting: Cardiovascular Disease

## 2016-12-15 MED ORDER — ROSUVASTATIN CALCIUM 20 MG PO TABS: 20.0000 mg | ORAL_TABLET | Freq: Every day | ORAL | 11 refills | Status: DC

## 2016-12-15 NOTE — Telephone Encounter (Signed)
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

## 2016-12-15 NOTE — Telephone Encounter (Signed)
Spoke with patient and verfied that cardiology was handling imaging regarding carotid ultrasound .  Patient stated this was being handled through cardiology.

## 2016-12-15 NOTE — Telephone Encounter (Signed)
Pt has requested lab results Pt contact [phone]

## 2016-12-18 ENCOUNTER — Other Ambulatory Visit: Payer: Self-pay | Admitting: Family Medicine

## 2016-12-18 DIAGNOSIS — E041 Nontoxic single thyroid nodule: Secondary | ICD-10-CM

## 2016-12-20 ENCOUNTER — Ambulatory Visit: Payer: PRIVATE HEALTH INSURANCE

## 2016-12-25 ENCOUNTER — Encounter: Payer: Self-pay | Admitting: Family Medicine

## 2016-12-26 ENCOUNTER — Ambulatory Visit
Admission: RE | Admit: 2016-12-26 | Discharge: 2016-12-26 | Disposition: A | Discharge status: Home / Self Care | Payer: Managed Care, Other (non HMO) | Source: Ambulatory Visit | Attending: Family Medicine | Admitting: Family Medicine

## 2016-12-26 DIAGNOSIS — E041 Nontoxic single thyroid nodule: Secondary | ICD-10-CM

## 2017-03-12 ENCOUNTER — Telehealth: Payer: Self-pay | Admitting: *Deleted

## 2017-03-12 ENCOUNTER — Encounter: Payer: Self-pay | Admitting: *Deleted

## 2017-03-12 NOTE — Telephone Encounter (Signed)
-----   Message from Minna Merritts, MD sent at 03/10/2017 12:39 PM EST ----- Regarding: carotid Can we send him a copy of his carotid u/s in the mail. We recommend he start a cholesterol medication, Goal LDL <70 thx TG

## 2017-03-12 NOTE — Telephone Encounter (Signed)
Copy of carotid ultrasound from 12/13/16 mailed to the patient.  Per his medication list, he is currently taking Crestor 20 mg once daily.

## 2017-04-18 ENCOUNTER — Other Ambulatory Visit: Payer: Self-pay | Admitting: Family Medicine

## 2017-04-18 DIAGNOSIS — M79602 Pain in left arm: Secondary | ICD-10-CM

## 2017-04-19 ENCOUNTER — Ambulatory Visit: Admission: RE | Admit: 2017-04-19 | Payer: Managed Care, Other (non HMO) | Source: Ambulatory Visit

## 2017-04-27 ENCOUNTER — Telehealth: Payer: Self-pay

## 2017-04-27 NOTE — Telephone Encounter (Signed)
Patient called in for acute issue . Place on arm near bursa  that is swollen area not healing still painful He was  previously seen by urgent care and currently on Doxycyline 2 doses left  and Prednisone .  Wanting to be seen today.  I advised him no available appointments today here with you.  I recommend that he go back to urgent care for evaluation . Patient has been seen by you for acute issue.Patient agreed to go to urgent care for evaluation .   Former Dr Lacinda Axon patient . Would like to establish with you.    Please advise

## 2017-04-30 NOTE — Telephone Encounter (Signed)
Patient states he went back to the [facility] and was given another prednisone and antibiotics.

## 2017-04-30 NOTE — Telephone Encounter (Signed)
Noted  

## 2017-04-30 NOTE — Telephone Encounter (Signed)
Please follow-up with the patient to make sure he got evaluated.  Thanks.

## 2017-04-30 NOTE — Telephone Encounter (Signed)
fyi

## 2017-07-05 ENCOUNTER — Encounter: Payer: Self-pay | Admitting: Cardiovascular Disease

## 2017-10-25 DIAGNOSIS — M109 Gout, unspecified: Secondary | ICD-10-CM

## 2017-10-25 HISTORY — DX: Gout, unspecified: M10.9

## 2017-11-27 DIAGNOSIS — M109 Gout, unspecified: Secondary | ICD-10-CM | POA: Insufficient documentation

## 2017-12-04 ENCOUNTER — Other Ambulatory Visit: Payer: Self-pay | Admitting: Family Medicine

## 2017-12-04 DIAGNOSIS — I6523 Occlusion and stenosis of bilateral carotid arteries: Secondary | ICD-10-CM

## 2017-12-13 ENCOUNTER — Ambulatory Visit (HOSPITAL_COMMUNITY)
Admission: RE | Admit: 2017-12-13 | Discharge: 2017-12-13 | Disposition: A | Discharge status: Home / Self Care | Payer: Managed Care, Other (non HMO) | Source: Ambulatory Visit | Attending: Cardiovascular Disease | Admitting: Cardiovascular Disease

## 2017-12-13 DIAGNOSIS — I6523 Occlusion and stenosis of bilateral carotid arteries: Secondary | ICD-10-CM | POA: Insufficient documentation

## 2018-01-15 ENCOUNTER — Other Ambulatory Visit: Payer: Self-pay | Admitting: Cardiovascular Disease

## 2018-02-15 ENCOUNTER — Other Ambulatory Visit: Payer: Self-pay | Admitting: Cardiovascular Disease

## 2018-02-18 DIAGNOSIS — Z79899 Other long term (current) drug therapy: Secondary | ICD-10-CM | POA: Insufficient documentation

## 2018-02-18 DIAGNOSIS — N183 Chronic kidney disease, stage 3 unspecified: Secondary | ICD-10-CM | POA: Insufficient documentation

## 2018-02-18 DIAGNOSIS — M1A00X0 Idiopathic chronic gout, unspecified site, without tophus (tophi): Secondary | ICD-10-CM | POA: Insufficient documentation

## 2018-03-06 ENCOUNTER — Other Ambulatory Visit: Payer: Self-pay | Admitting: Cardiovascular Disease

## 2018-03-06 MED ORDER — AMLODIPINE BESYLATE 10 MG PO TABS: 10.0000 mg | ORAL_TABLET | Freq: Every day | ORAL | 6 refills | Status: DC

## 2018-03-08 ENCOUNTER — Other Ambulatory Visit: Payer: Self-pay | Admitting: Cardiovascular Disease

## 2018-03-09 ENCOUNTER — Other Ambulatory Visit: Payer: Self-pay | Admitting: Cardiovascular Disease

## 2018-03-09 MED ORDER — ROSUVASTATIN CALCIUM 20 MG PO TABS: 20.0000 mg | ORAL_TABLET | Freq: Every day | ORAL | 3 refills | Status: DC

## 2018-04-10 ENCOUNTER — Other Ambulatory Visit: Payer: Self-pay | Admitting: Internal Medicine

## 2018-04-10 DIAGNOSIS — R809 Proteinuria, unspecified: Secondary | ICD-10-CM

## 2018-04-17 ENCOUNTER — Ambulatory Visit
Admission: RE | Admit: 2018-04-17 | Discharge: 2018-04-17 | Disposition: A | Discharge status: Home / Self Care | Payer: Managed Care, Other (non HMO) | Source: Ambulatory Visit | Attending: Internal Medicine | Admitting: Internal Medicine

## 2018-04-17 DIAGNOSIS — R809 Proteinuria, unspecified: Secondary | ICD-10-CM | POA: Diagnosis present

## 2018-04-22 ENCOUNTER — Other Ambulatory Visit: Payer: Self-pay

## 2018-04-22 ENCOUNTER — Encounter: Payer: Self-pay | Admitting: Nurse Practitioner

## 2018-04-22 ENCOUNTER — Ambulatory Visit: Payer: Managed Care, Other (non HMO) | Attending: Nurse Practitioner | Admitting: Nurse Practitioner

## 2018-04-22 VITALS — BP 132/87 | HR 96 | Temp 98.2°F | Ht 71.0 in | Wt 191.0 lb

## 2018-04-22 DIAGNOSIS — G8929 Other chronic pain: Secondary | ICD-10-CM | POA: Diagnosis present

## 2018-04-22 DIAGNOSIS — M25521 Pain in right elbow: Secondary | ICD-10-CM | POA: Diagnosis present

## 2018-04-22 DIAGNOSIS — M899 Disorder of bone, unspecified: Secondary | ICD-10-CM | POA: Diagnosis present

## 2018-04-22 DIAGNOSIS — Z79899 Other long term (current) drug therapy: Secondary | ICD-10-CM | POA: Insufficient documentation

## 2018-04-22 DIAGNOSIS — G894 Chronic pain syndrome: Secondary | ICD-10-CM | POA: Diagnosis present

## 2018-04-22 DIAGNOSIS — E785 Hyperlipidemia, unspecified: Secondary | ICD-10-CM | POA: Insufficient documentation

## 2018-04-22 DIAGNOSIS — M25572 Pain in left ankle and joints of left foot: Secondary | ICD-10-CM

## 2018-04-22 DIAGNOSIS — M1A00X0 Idiopathic chronic gout, unspecified site, without tophus (tophi): Secondary | ICD-10-CM | POA: Diagnosis present

## 2018-04-22 DIAGNOSIS — M79604 Pain in right leg: Secondary | ICD-10-CM | POA: Insufficient documentation

## 2018-04-22 DIAGNOSIS — Z789 Other specified health status: Secondary | ICD-10-CM | POA: Diagnosis present

## 2018-04-22 DIAGNOSIS — M79605 Pain in left leg: Secondary | ICD-10-CM

## 2018-04-22 DIAGNOSIS — M25522 Pain in left elbow: Secondary | ICD-10-CM | POA: Insufficient documentation

## 2018-04-22 DIAGNOSIS — M25571 Pain in right ankle and joints of right foot: Secondary | ICD-10-CM | POA: Diagnosis present

## 2018-04-22 NOTE — Patient Instructions (Signed)

## 2018-04-22 NOTE — Progress Notes (Signed)
Patient's Name: Chris Johnson  MRN: [id number]  Referring Provider: Tandy Gaw, [place]  DOB: [birth year]  PCP: [facility], [place]: 04/22/2018  Note by: [name] NP  Service setting: Ambulatory outpatient  Specialty: Interventional Pain Management  Location: [facility] (AMB) Pain Management Facility    Patient type: New Patient    Primary Reason(s) for Visit: Initial Patient Evaluation CC: Leg Pain (calf)  HPI  Mr. Chris Johnson is a 68 y.o. year old, male patient, who comes today for an initial evaluation. He has Hypertension; Insomnia; Depression with anxiety; Family history of prostate cancer; Hyperlipidemia; Allergic rhinitis; Annual physical exam; Cough; Pain, elbow joint; Pain in joint of right elbow; Chronic gouty arthropathy without tophi; Chronic kidney disease (CKD), stage III (moderate) (HCC); HLD (hyperlipidemia); Encounter for long-term (current) use of high-risk medication; Chronic pain of both lower extremities (Primary Area of Pain) (L>R); Chronic ankle pain, bilateral (Secondary Area of Pain) (L>R); Bilateral elbow joint pain (Tertiary Area of Pain) (R>L); Chronic pain syndrome; Pharmacologic therapy; Disorder of skeletal system; and Problems influencing health status on their problem list.. His primarily concern today is the Leg Pain (calf)  Pain Assessment: Location: Right, Left Leg(calf, both elbow) Radiating: Pain radiaties down both calf to med ankle Onset: More than a month ago Duration: Chronic pain Quality: Tightness Severity: 5 /10 (subjective, self-reported pain score)  Note: Reported level is compatible with observation. Clinically the patient looks like a 0/10       Information on the proper use of the pain scale provided to the patient today.  Effect on ADL: limts my daily activites, not able to get a good night sleep due to the pain Timing: Constant Modifying factors: strecthing, bending. warm bath BP: 132/87  HR: 96  Onset and Duration: Date of onset:  4 years Cause of pain: Unknown Severity: Getting worse, NAS-11 at its worse: 9/10, NAS-11 at its best: 4/10, NAS-11 now: 5/10 and NAS-11 on the average: 5/10 Timing: Night and After a period of immobility Aggravating Factors: Climbing, Intercourse (sex), Kneeling, Prolonged sitting, Squatting, Walking, Walking uphill and Walking downhill Alleviating Factors: Warm showers or baths Associated Problems: left unanswered Quality of Pain: Disabling, Feeling of constriction and Punishing Previous Examinations or Tests: X-rays Previous Treatments: The patient denies treatments   The patient comes into the clinics today for the first time for a chronic pain management evaluation.  According to the patient his primary area of pain is in his lower legs; calf.  He denies any precipitating factors.  He said he was diagnosed and treated for gout. He denies any previous surgery, interventional therapy or physical therapy.  He did treatment by chiropractor years ago.  He admits that massage therapy is effective for his pain.   His second area of pain is in his feet and ankles. He admits that he had some swelling in his great toe and arches.  He has been diagnosed and treated with gout.  He did have injection by Dr. Elvina Mattes which was effective.  He admits the use of oral steroids was not effective.  He has made some dietary changes.   Denies any surgery.  He has x-ray of his right foot.  Third area pain is in his elbow.  He admits the right is greater than the left.  He was diagnosed with bone spurs.  Denies any previous surgery.  He did have aspiration of his right elbow with [facility] orth[facility].  He admits x-rays were completed in [place].  Today I took  the time to provide the patient with information regarding this pain practice. The patient was informed that the practice is divided into two sections: an interventional pain management section, as well as a completely separate and distinct medication  management section. I explained that there are procedure days for interventional therapies, and evaluation days for follow-ups and medication management. Because of the amount of documentation required during both, they are kept separated. This means that there is the possibility that te may be scheduled for a procedure on one day, and medication management the next. I have also informed him that because of staffing and facility limitations, this practice will no longer take patients for medication management only. To illustrate the reasons for this, I gave the patient the example of surgeons, and how inappropriate it would be to refer a patient to his/her care, just to write for the post-surgical antibiotics on a surgery done by a different surgeon.   Because interventional pain management is part of the board-certified specialty for the doctors, the patient was informed that joining this practice means that they are open to any and all interventional therapies. I made it clear that this does not mean that they will be forced to have any procedures done. What this means is that I believe interventional therapies to be essential part of the diagnosis and proper management of chronic pain conditions. Therefore, patients not interested in these interventional alternatives will be better served under the care of a different practitioner.  The patient was also made aware of my Comprehensive Pain Management Safety Guidelines where by joining this practice, they limit all of their nerve blocks and joint injections to those done by our practice, for as long as we are retained to manage their care. Historic Controlled Substance Pharmacotherapy Review  PMP and historical list of controlled substances: Hydrocodone/acetaminophen 5/325 mg, zolpidem ER 12.5 mg, oxycodone/acetaminophen 5/325 mg, Highest opioid analgesic regimen found: Oxycodone/acetaminophen 5/325 mg 1 tablet 4 times daily (fill date 09/18/2016) oxycodone  20 mg/day Most recent opioid analgesic: Hydrocodone/acetaminophen 5/325 1 tablet 4 times daily (fill date 03/13/2018) codon 20 mg/day Current opioid analgesics: None Highest recorded MME/day: 30 mg/day MME/day: 0 mg/day Medications: The patient did not bring the medication(s) to the appointment, as requested in our "New Patient Package" Pharmacodynamics: Desired effects: Analgesia: The patient reports >50% benefit. Reported improvement in function: The patient reports medication allows him to accomplish basic ADLs. Clinically meaningful improvement in function (CMIF): Sustained CMIF goals met Perceived effectiveness: Described as relatively effective, allowing for increase in activities of daily living (ADL) Undesirable effects: Side-effects or Adverse reactions: None reported Historical Monitoring: The patient  reports no history of drug use. List of all UDS Test(s): No results found for: MDMA, COCAINSCRNUR, PCPSCRNUR, PCPQUANT, CANNABQUANT, THCU, [place] List of all Serum Drug Screening Test(s):  No results found for: AMPHSCRSER, BARBSCRSER, BENZOSCRSER, COCAINSCRSER, PCPSCRSER, PCPQUANT, THCSCRSER, CANNABQUANT, OPIATESCRSER, OXYSCRSER, PROPOXSCRSER Historical Background Evaluation: [place] PDMP: Six (6) year initial data search conducted.             [place] Department of public safety, offender search: Editor, commissioning Information) Non-contributory Risk Assessment Profile: Aberrant behavior: None observed or detected today Risk factors for fatal opioid overdose: None identified today Fatal overdose hazard ratio (HR): Calculation deferred Non-fatal overdose hazard ratio (HR): Calculation deferred Risk of opioid abuse or dependence: 0.7-3.0% with doses ? 36 MME/day and 6.1-26% with doses ? 120 MME/day. Substance use disorder (SUD) risk level: Pending results of Medical Psychology Evaluation for SUD Opioid risk tool (  ORT) (Total Score): 0  ORT Scoring interpretation table:  Score <3 = Low Risk for SUD   Score between 4-7 = Moderate Risk for SUD  Score >8 = High Risk for Opioid Abuse   PHQ-2 Depression Scale:  Total score:    PHQ-2 Scoring interpretation table: (Score and probability of major depressive disorder)  Score 0 = No depression  Score 1 = 15.4% Probability  Score 2 = 21.1% Probability  Score 3 = 38.4% Probability  Score 4 = 45.5% Probability  Score 5 = 56.4% Probability  Score 6 = 78.6% Probability   PHQ-9 Depression Scale:  Total score:    PHQ-9 Scoring interpretation table:  Score 0-4 = No depression  Score 5-9 = Mild depression  Score 10-14 = Moderate depression  Score 15-19 = Moderately severe depression  Score 20-27 = Severe depression (2.4 times higher risk of SUD and 2.89 times higher risk of overuse)   Pharmacologic Plan: Pending ordered tests and/or consults  Meds  The patient has a current medication list which includes the following prescription(s): allopurinol, amlodipine, colchicine, losartan, rosuvastatin, and zolpidem.  Current Outpatient Medications on File Prior to Visit  Medication Sig  . allopurinol (ZYLOPRIM) 100 MG tablet Take 200 mg by mouth daily.  Marland Kitchen amLODipine (NORVASC) 10 MG tablet Take 1 tablet (10 mg total) by mouth daily.  . colchicine 0.6 MG tablet Take 0.6 mg by mouth 2 (two) times daily.  Marland Kitchen losartan (COZAAR) 100 MG tablet Take 1 tablet (100 mg total) by mouth daily.  . rosuvastatin (CRESTOR) 20 MG tablet Take 1 tablet (20 mg total) by mouth daily.  Marland Kitchen zolpidem (AMBIEN CR) 12.5 MG CR tablet Take 1 tablet (12.5 mg total) by mouth at bedtime as needed for sleep.   No current facility-administered medications on file prior to visit.    Imaging Review  Note: Available results from prior imaging studies were reviewed.        ROS  Cardiovascular History: Daily Aspirin intake and High blood pressure Pulmonary or Respiratory History: No reported pulmonary signs or symptoms such as wheezing and difficulty taking a deep full breath (Asthma),  difficulty blowing air out (Emphysema), coughing up mucus (Bronchitis), persistent dry cough, or temporary stoppage of breathing during sleep Neurological History: No reported neurological signs or symptoms such as seizures, abnormal skin sensations, urinary and/or fecal incontinence, being born with an abnormal open spine and/or a tethered spinal cord Review of Past Neurological Studies: No results found for this or any previous visit. Psychological-Psychiatric History: No reported psychological or psychiatric signs or symptoms such as difficulty sleeping, anxiety, depression, delusions or hallucinations (schizophrenial), mood swings (bipolar disorders) or suicidal ideations or attempts Gastrointestinal History: Alternating episodes iof diarrhea and constipation (IBS-Irritable bowe syndrome) Genitourinary History: Peeing blood Hematological History: No reported hematological signs or symptoms such as prolonged bleeding, low or poor functioning platelets, bruising or bleeding easily, hereditary bleeding problems, low energy levels due to low hemoglobin or being anemic Endocrine History: No reported endocrine signs or symptoms such as high or low blood sugar, rapid heart rate due to high thyroid levels, obesity or weight gain due to slow thyroid or thyroid disease Rheumatologic History: No reported rheumatological signs and symptoms such as fatigue, joint pain, tenderness, swelling, redness, heat, stiffness, decreased range of motion, with or without associated rash Musculoskeletal History: Negative for myasthenia gravis, muscular dystrophy, multiple sclerosis or malignant hyperthermia Work History: Retired  Allergies  Mr. Hufford is allergic to atorvastatin and celexa [citalopram hydrobromide].  Laboratory  Chemistry  Inflammation Markers Lab Results  Component Value Date   CRP <1 04/22/2018   ESRSEDRATE 25 04/22/2018   (CRP: Acute Phase) (ESR: Chronic Phase) Renal Function Markers Lab Results   Component Value Date   BUN 14 04/22/2018   CREATININE 1.22 04/22/2018   GFRAA 71 04/22/2018   GFRNONAA 62 04/22/2018   Hepatic Function Markers Lab Results  Component Value Date   AST 24 04/22/2018   ALT 12 12/14/2015   ALBUMIN 4.6 04/22/2018   ALKPHOS 83 04/22/2018   Electrolytes Lab Results  Component Value Date   NA 142 04/22/2018   K 4.0 04/22/2018   CL 105 04/22/2018   CALCIUM 9.7 04/22/2018   MG 2.2 04/22/2018   Neuropathy Markers Lab Results  Component Value Date   VITAMINB12 380 04/22/2018   Bone Pathology Markers Lab Results  Component Value Date   ALKPHOS 83 04/22/2018   25OHVITD1 WILL FOLLOW 04/22/2018   25OHVITD2 WILL FOLLOW 04/22/2018   25OHVITD3 WILL FOLLOW 04/22/2018   CALCIUM 9.7 04/22/2018   Coagulation Parameters Lab Results  Component Value Date   PLT 262.0 12/14/2015   Cardiovascular Markers Lab Results  Component Value Date   HGB 15.3 12/14/2015   HCT 44.4 12/14/2015   Note: Lab results reviewed.  PFSH  Drug: Mr. Schoch  reports no history of drug use. Alcohol:  reports current alcohol use of about 5.0 standard drinks of alcohol per week. Tobacco:  reports that he has never smoked. He has never used smokeless tobacco. Medical:  has a past medical history of Arthritis, Gout (10/25/2017), Hyperlipidemia, Hypertension, and Normal cardiac stress test (1995). Family: family history includes Alcohol abuse in his mother; Arthritis in his father; Breast cancer in his sister; Heart attack in his mother; Hypertension in his mother; Prostate cancer in his father; Skin cancer in his mother; Stroke in his mother.  Past Surgical History:  Procedure Laterality Date  . colonoscopy    . VARICOSE VEIN SURGERY     Active Ambulatory Problems    Diagnosis Date Noted  . Hypertension 01/30/2011  . Insomnia 01/30/2011  . Depression with anxiety 05/24/2012  . Family history of prostate cancer 06/23/2013  . Hyperlipidemia 06/23/2013  . Allergic  rhinitis 04/06/2014  . Annual physical exam 12/14/2015  . Cough 10/03/2016  . Pain, elbow joint 06/18/2017  . Pain in joint of right elbow 06/18/2017  . Chronic gouty arthropathy without tophi 02/18/2018  . Chronic kidney disease (CKD), stage III (moderate) ([place]) 02/18/2018  . HLD (hyperlipidemia) 04/22/2018  . Encounter for long-term (current) use of high-risk medication 02/18/2018  . Chronic pain of both lower extremities (Primary Area of Pain) (L>R) 04/22/2018  . Chronic ankle pain, bilateral (Secondary Area of Pain) (L>R) 04/22/2018  . Bilateral elbow joint pain [facility] Area of Pain) (R>L) 04/22/2018  . Chronic pain syndrome 04/22/2018  . Pharmacologic therapy 04/22/2018  . Disorder of skeletal system 04/22/2018  . Problems influencing health status 04/22/2018   Resolved Ambulatory Problems    Diagnosis Date Noted  . Dizziness 05/17/2011  . Left otitis media 05/17/2011  . Sinusitis acute 06/11/2011  . Conjunctivitis of both eyes 06/11/2011  . Right otitis media 10/02/2011  . Bilateral otitis externa 10/02/2011  . Skin lesion 02/27/2012  . Hyperlipidemia 02/27/2012  . Atypical chest pain 05/24/2012  . Unspecified gastritis and gastroduodenitis without mention of hemorrhage 05/30/2012  . Black tarry stools 06/24/2012  . Loss of weight 06/23/2013  . Routine general medical examination at a health care facility  10/12/2014   Past Medical History:  Diagnosis Date  . Arthritis   . Gout 10/25/2017  . Normal cardiac stress test 1995   Constitutional Exam  General appearance: Well nourished, well developed, and well hydrated. In no apparent acute distress Vitals:   04/22/18 1308  BP: 132/87  Pulse: 96  Temp: 98.2 F (36.8 C)  SpO2: 99%  Weight: 191 lb (86.6 kg)  Height: '5\' 11"'  (1.803 m)   BMI Assessment: Estimated body mass index is 26.64 kg/m as calculated from the following:   Height as of this encounter: '5\' 11"'  (1.803 m).   Weight as of this encounter: 191 lb  (86.6 kg).  BMI interpretation table: BMI level Category Range association with higher incidence of chronic pain  <18 kg/m2 Underweight   18.5-24.9 kg/m2 Ideal body weight   25-29.9 kg/m2 Overweight Increased incidence by 20%  30-34.9 kg/m2 Obese (Class I) Increased incidence by 68%  35-39.9 kg/m2 Severe obesity (Class II) Increased incidence by 136%  >40 kg/m2 Extreme obesity (Class III) Increased incidence by 254%   BMI Readings from Last 4 Encounters:  04/22/18 26.64 kg/m  10/03/16 26.00 kg/m  12/14/15 25.44 kg/m  10/12/14 25.98 kg/m   Wt Readings from Last 4 Encounters:  04/22/18 191 lb (86.6 kg)  10/03/16 186 lb 6.4 oz (84.6 kg)  12/14/15 182 lb 6 oz (82.7 kg)  10/12/14 186 lb 4 oz (84.5 kg)  Psych/Mental status: Alert, oriented x 3 (person, place, & time)       Eyes: PERLA Respiratory: No evidence of acute respiratory distress  Cervical Spine Exam  Inspection: No masses, redness, or swelling Alignment: Symmetrical Functional ROM: Unrestricted ROM      Stability: No instability detected Muscle strength & Tone: Functionally intact Sensory: Unimpaired Palpation: No palpable anomalies              Upper Extremity (UE) Exam    Side: Right upper extremity  Side: Left upper extremity  Inspection: No masses, redness, swelling, or asymmetry. No contractures  Inspection: No masses, redness, swelling, or asymmetry. No contractures  Functional ROM: Adequate ROM for elbow  Functional ROM: Adequate ROM for elbow  Muscle strength & Tone: Functionally intact  Muscle strength & Tone: Functionally intact  Sensory: Unimpaired  Sensory: Unimpaired  Palpation: Complains of area being tender to palpation              Palpation: No palpable anomalies              Specialized Test(s): Deferred         Specialized Test(s): Deferred          Thoracic Spine Exam  Inspection: No masses, redness, or swelling Alignment: Symmetrical Functional ROM: Unrestricted ROM Stability: No  instability detected Sensory: Unimpaired Muscle strength & Tone: No palpable anomalies  Lumbar Spine Exam  Inspection: No masses, redness, or swelling Alignment: Symmetrical Functional ROM: Unrestricted ROM      Stability: No instability detected Muscle strength & Tone: Functionally intact Sensory: Unimpaired Palpation: No palpable anomalies       Provocative Tests: Lumbar Hyperextension and rotation test: evaluation deferred today       Patrick's Maneuver: evaluation deferred today                    Gait & Posture Assessment  Ambulation: Unassisted Gait: Relatively normal for age and body habitus Posture: WNL   Lower Extremity Exam    Side: Right lower extremity  Side: Left lower extremity  Inspection: No masses, redness, swelling, or asymmetry. No contractures  Inspection: No masses, redness, swelling, or asymmetry. No contractures  Functional ROM: Guarding        below the knee and feet  Functional ROM: Guarding        below the knee and feet  Muscle strength & Tone: Functionally intact  Muscle strength & Tone: Functionally intact  Sensory: Unimpaired  Sensory: Unimpaired  Palpation: No palpable anomalies 2+pulses  Palpation: No palpable anomalies left foot slight lateral alignment in the foot. 2=pulses   Assessment  Primary Diagnosis & Pertinent Problem List: The primary encounter diagnosis was Chronic gouty arthropathy without tophi. Diagnoses of Chronic pain of both lower extremities (Primary Area of Pain) (L>R), Chronic ankle pain, bilateral (Secondary Area of Pain) (L>R), Bilateral elbow joint pain (Tertiary Area of Pain) (R>L), Chronic pain syndrome, Pharmacologic therapy, Disorder of skeletal system, and Problems influencing health status were also pertinent to this visit.  Visit Diagnosis: 1. Chronic gouty arthropathy without tophi   2. Chronic pain of both lower extremities (Primary Area of Pain) (L>R)   3. Chronic ankle pain, bilateral (Secondary Area of Pain) (L>R)    4. Bilateral elbow joint pain (Tertiary Area of Pain) (R>L)   5. Chronic pain syndrome   6. Pharmacologic therapy   7. Disorder of skeletal system   8. Problems influencing health status    Plan of Care  Initial treatment plan:  Please be advised that as per protocol, today's visit has been an evaluation only. We have not taken over the patient's controlled substance management.  Problem-specific plan: No problem-specific Assessment & Plan notes found for this encounter.  Ordered Lab-work, Procedure(s), Referral(s), & Consult(s): Orders Placed This Encounter  Procedures  . DG Foot Complete Left  . Compliance Drug Analysis, Ur  . Comp. Metabolic Panel (12)  . Magnesium  . Vitamin B12  . Sedimentation rate  . 25-Hydroxyvitamin D Lcms D2+D3  . C-reactive protein   Pharmacotherapy: Medications ordered:  No orders of the defined types were placed in this encounter.  Medications administered during this visit: [name] had no medications administered during this visit.   Pharmacotherapy under consideration:  Opioid Analgesics: The patient was informed that there is no guarantee that he would be a candidate for opioid analgesics. The decision will be made following CDC guidelines. This decision will be based on the results of diagnostic studies, as well as Mr. Gildersleeve risk profile.  Membrane stabilizer: To be determined at a later time Muscle relaxant: To be determined at a later time NSAID: To be determined at a later time Other analgesic(s): To be determined at a later time   Interventional therapies under consideration: Mr. Kallal was informed that there is no guarantee that he would be a candidate for interventional therapies. The decision will be based on the results of diagnostic studies, as well as Mr. Stogsdill risk profile.  Possible procedure(s): Diagnostic bilateral intra-articular elbow injections Additional interventional therapy to be decided with the  results of test and x-rays   Provider-requested follow-up: Return for xrays before return for second visit, 2nd Visit, w/ Dr. Dossie Arbour, medical record release.  Future Appointments  Date Time Provider [place]  05/06/2018  9:30 AM Milinda Pointer, MD [facility] None  05/09/2018 11:00 AM Billey Co, MD [facility] None    Primary Care Physician: [facility], [place] Location: [facility] Outpatient Pain Management Facility Note by:  Date: 04/22/2018; Time: 8:48 AM  Pain Score Disclaimer: We use the NRS-11  scale. This is a self-reported, subjective measurement of pain severity with only modest accuracy. It is used primarily to identify changes within a particular patient. It must be understood that outpatient pain scales are significantly less accurate that those used for research, where they can be applied under ideal controlled circumstances with minimal exposure to variables. In reality, the score is likely to be a combination of pain intensity and pain affect, where pain affect describes the degree of emotional arousal or changes in action readiness caused by the sensory experience of pain. Factors such as social and work situation, setting, emotional state, anxiety levels, expectation, and prior pain experience may influence pain perception and show large inter-individual differences that may also be affected by time variables.  Patient instructions provided during this appointment: Patient Instructions   ____________________________________________________________________________________________  Appointment Policy Summary  It is our goal and responsibility to provide the medical community with assistance in the evaluation and management of patients with chronic pain. Unfortunately our resources are limited. Because we do not have an unlimited amount of time, or available appointments, we are required to closely monitor and manage their use. The following rules exist to  maximize their use:  Patient's responsibilities: 1. Punctuality:  At what time should I arrive? You should be physically present in our office 30 minutes before your scheduled appointment. Your scheduled appointment is with your assigned healthcare provider. However, it takes 5-10 minutes to be "checked-in", and another 15 minutes for the nurses to do the admission. If you arrive to our office at the time you were given for your appointment, you will end up being at least 20-25 minutes late to your appointment with the provider. 2. Tardiness:  What happens if I arrive only a few minutes after my scheduled appointment time? You will need to reschedule your appointment. The cutoff is your appointment time. This is why it is so important that you arrive at least 30 minutes before that appointment. If you have an appointment scheduled for 10:00 AM and you arrive at 10:01, you will be required to reschedule your appointment.  3. Plan ahead:  Always assume that you will encounter traffic on your way in. Plan for it. If you are dependent on a driver, make sure they understand these rules and the need to arrive early. 4. Other appointments and responsibilities:  Avoid scheduling any other appointments before or after your pain clinic appointments.  5. Be prepared:  Write down everything that you need to discuss with your healthcare provider and give this information to the admitting nurse. Write down the medications that you will need refilled. Bring your pills and bottles (even the empty ones), to all of your appointments, except for those where a procedure is scheduled. 6. No children or pets:  Find someone to take care of them. It is not appropriate to bring them in. 7. Scheduling changes:  We request "advanced notification" of any changes or cancellations. 8. Advanced notification:  Defined as a time period of more than 24 hours prior to the originally scheduled appointment. This allows for the  appointment to be offered to other patients. 9. Rescheduling:  When a visit is rescheduled, it will require the cancellation of the original appointment. For this reason they both fall within the category of "Cancellations".  10. Cancellations:  They require advanced notification. Any cancellation less than 24 hours before the  appointment will be recorded as a "No Show". 11. No Show:  Defined as an unkept appointment where the  patient failed to notify or declare to the practice their intention or inability to keep the appointment.  Corrective process for repeat offenders:  1. Tardiness: Three (3) episodes of rescheduling due to late arrivals will be recorded as one (1) "No Show". 2. Cancellation or reschedule: Three (3) cancellations or rescheduling will be recorded as one (1) "No Show". 3. "No Shows": Three (3) "No Shows" within a 12 month period will result in discharge from the practice. ____________________________________________________________________________________________   ______________________________________________________________________________________________  Specialty Pain Scale  Introduction:  There are significant differences in how pain is reported. The word pain usually refers to physical pain, but it is also a common synonym of suffering. The medical community uses a scale from 0 (zero) to 10 (ten) to report pain level. Zero (0) is described as "no pain", while ten (10) is described as "the worse pain you can imagine". The problem with this scale is that physical pain is reported along with suffering. Suffering refers to mental pain, or more often yet it refers to any unpleasant feeling, emotion or aversion associated with the perception of harm or threat of harm. It is the psychological component of pain.  Pain Specialists prefer to separate the two components. The pain scale used by this practice is the Verbal Numerical Rating Scale (VNRS-11). This scale is for the  physical pain only. DO NOT INCLUDE how your pain psychologically affects you. This scale is for adults 88 years of age and older. It has 11 (eleven) levels. The 1st level is 0/10. This means: "right now, I have no pain". In the context of pain management, it also means: "right now, my physical pain is under control with the current therapy".  General Information:  The scale should reflect your current level of pain. Unless you are specifically asked for the level of your worst pain, or your average pain. If you are asked for one of these two, then it should be understood that it is over the past 24 hours.  Levels 1 (one) through 5 (five) are described below, and can be treated as an outpatient. Ambulatory pain management facilities such as ours are more than adequate to treat these levels. Levels 6 (six) through 10 (ten) are also described below, however, these must be treated as a hospitalized patient. While levels 6 (six) and 7 (seven) may be evaluated at an urgent care facility, levels 8 (eight) through 10 (ten) constitute medical emergencies and as such, they belong in a hospital's emergency department. When having these levels (as described below), do not come to our office. Our facility is not equipped to manage these levels. Go directly to an urgent care facility or an emergency department to be evaluated.  Definitions:  Activities of Daily Living (ADL): Activities of daily living (ADL or ADLs) is a term used in healthcare to refer to people's daily self-care activities. Health professionals often use a person's ability or inability to perform ADLs as a measurement of their functional status, particularly in regard to people post injury, with disabilities and the elderly. There are two ADL levels: Basic and Instrumental. Basic Activities of Daily Living (BADL  or BADLs) consist of self-care tasks that include: Bathing and showering; personal hygiene and grooming (including brushing/combing/styling  hair); dressing; Toilet hygiene (getting to the toilet, cleaning oneself, and getting back up); eating and self-feeding (not including cooking or chewing and swallowing); functional mobility, often referred to as "transferring", as measured by the ability to walk, get in and out of bed, and  get into and out of a chair; the broader definition (moving from one place to another while performing activities) is useful for people with different physical abilities who are still able to get around independently. Basic ADLs include the things many people do when they get up in the morning and get ready to go out of the house: get out of bed, go to the toilet, bathe, dress, groom, and eat. On the average, loss of function typically follows a particular order. Hygiene is the first to go, followed by loss of toilet use and locomotion. The last to go is the ability to eat. When there is only one remaining area in which the person is independent, there is a 62.9% chance that it is eating and only a 3.5% chance that it is hygiene. Instrumental Activities of Daily Living (IADL or IADLs) are not necessary for fundamental functioning, but they let an individual live independently in a community. IADL consist of tasks that include: cleaning and maintaining the house; home establishment and maintenance; care of others (including selecting and supervising caregivers); care of pets; child rearing; managing money; managing financials (investments, etc.); meal preparation and cleanup; shopping for groceries and necessities; moving within the community; safety procedures and emergency responses; health management and maintenance (taking prescribed medications); and using the telephone or other form of communication.  Instructions:  Most patients tend to report their pain as a combination of two factors, their physical pain and their psychosocial pain. This last one is also known as "suffering" and it is reflection of how physical pain  affects you socially and psychologically. From now on, report them separately.  From this point on, when asked to report your pain level, report only your physical pain. Use the following table for reference.  Pain Clinic Pain Levels (0-5/10)  Pain Level Score  Description  No Pain 0   Mild pain 1 Nagging, annoying, but does not interfere with basic activities of daily living (ADL). Patients are able to eat, bathe, get dressed, toileting (being able to get on and off the toilet and perform personal hygiene functions), transfer (move in and out of bed or a chair without assistance), and maintain continence (able to control bladder and bowel functions). Blood pressure and heart rate are unaffected. A normal heart rate for a healthy adult ranges from 60 to 100 bpm (beats per minute).   Mild to moderate pain 2 Noticeable and distracting. Impossible to hide from other people. More frequent flare-ups. Still possible to adapt and function close to normal. It can be very annoying and may have occasional stronger flare-ups. With discipline, patients may get used to it and adapt.   Moderate pain 3 Interferes significantly with activities of daily living (ADL). It becomes difficult to feed, bathe, get dressed, get on and off the toilet or to perform personal hygiene functions. Difficult to get in and out of bed or a chair without assistance. Very distracting. With effort, it can be ignored when deeply involved in activities.   Moderately severe pain 4 Impossible to ignore for more than a few minutes. With effort, patients may still be able to manage work or participate in some social activities. Very difficult to concentrate. Signs of autonomic nervous system discharge are evident: dilated pupils (mydriasis); mild sweating (diaphoresis); sleep interference. Heart rate becomes elevated (>115 bpm). Diastolic blood pressure (lower number) rises above 100 mmHg. Patients find relief in laying down and not moving.    Severe pain 5 Intense and extremely unpleasant. Associated  with frowning face and frequent crying. Pain overwhelms the senses.  Ability to do any activity or maintain social relationships becomes significantly limited. Conversation becomes difficult. Pacing back and forth is common, as getting into a comfortable position is nearly impossible. Pain wakes you up from deep sleep. Physical signs will be obvious: pupillary dilation; increased sweating; goosebumps; brisk reflexes; cold, clammy hands and feet; nausea, vomiting or dry heaves; loss of appetite; significant sleep disturbance with inability to fall asleep or to remain asleep. When persistent, significant weight loss is observed due to the complete loss of appetite and sleep deprivation.  Blood pressure and heart rate becomes significantly elevated. Caution: If elevated blood pressure triggers a pounding headache associated with blurred vision, then the patient should immediately seek attention at an urgent or emergency care unit, as these may be signs of an impending stroke.    Emergency Department Pain Levels (6-10/10)  Emergency Room Pain 6 Severely limiting. Requires emergency care and should not be seen or managed at an outpatient pain management facility. Communication becomes difficult and requires great effort. Assistance to reach the emergency department may be required. Facial flushing and profuse sweating along with potentially dangerous increases in heart rate and blood pressure will be evident.   Distressing pain 7 Self-care is very difficult. Assistance is required to transport, or use restroom. Assistance to reach the emergency department will be required. Tasks requiring coordination, such as bathing and getting dressed become very difficult.   Disabling pain 8 Self-care is no longer possible. At this level, pain is disabling. The individual is unable to do even the most "basic" activities such as walking, eating, bathing, dressing,  transferring to a bed, or toileting. Fine motor skills are lost. It is difficult to think clearly.   Incapacitating pain 9 Pain becomes incapacitating. Thought processing is no longer possible. Difficult to remember your own name. Control of movement and coordination are lost.   The worst pain imaginable 10 At this level, most patients pass out from pain. When this level is reached, collapse of the autonomic nervous system occurs, leading to a sudden drop in blood pressure and heart rate. This in turn results in a temporary and dramatic drop in blood flow to the brain, leading to a loss of consciousness. Fainting is one of the body's self defense mechanisms. Passing out puts the brain in a calmed state and causes it to shut down for a while, in order to begin the healing process.    Summary: 1. Refer to this scale when providing [place] with your pain level. 2. Be accurate and careful when reporting your pain level. This will help with your care. 3. Over-reporting your pain level will lead to loss of credibility. 4. Even a level of 1/10 means that there is pain and will be treated at our facility. 5. High, inaccurate reporting will be documented as "Symptom Exaggeration", leading to loss of credibility and suspicions of possible secondary gains such as obtaining more narcotics, or wanting to appear disabled, for fraudulent reasons. 6. Only pain levels of 5 or below will be seen at our facility. 7. Pain levels of 6 and above will be sent to the Emergency Department and the appointment cancelled. ______________________________________________________________________________________________

## 2018-04-23 ENCOUNTER — Ambulatory Visit
Admission: RE | Admit: 2018-04-23 | Discharge: 2018-04-23 | Disposition: A | Discharge status: Home / Self Care | Payer: Managed Care, Other (non HMO) | Source: Ambulatory Visit | Attending: Nurse Practitioner | Admitting: Nurse Practitioner

## 2018-04-23 DIAGNOSIS — M79604 Pain in right leg: Secondary | ICD-10-CM | POA: Diagnosis present

## 2018-04-23 DIAGNOSIS — M79605 Pain in left leg: Principal | ICD-10-CM

## 2018-04-23 DIAGNOSIS — G8929 Other chronic pain: Secondary | ICD-10-CM | POA: Diagnosis present

## 2018-04-24 NOTE — Progress Notes (Signed)
Results were reviewed and found to be: mildly abnormal  No acute injury or pathology identified  Review would suggest interventional pain management techniques may be of benefit 

## 2018-04-25 LAB — COMPLIANCE DRUG ANALYSIS, UR

## 2018-04-26 LAB — COMP. METABOLIC PANEL (12)
AST: 24 IU/L (ref 0–40)
Albumin/Globulin Ratio: 1.7 (ref 1.2–2.2)
Albumin: 4.6 g/dL (ref 3.8–4.8)
Alkaline Phosphatase: 83 IU/L (ref 39–117)
BUN/Creatinine Ratio: 11 (ref 10–24)
BUN: 14 mg/dL (ref 8–27)
Bilirubin Total: 0.3 mg/dL (ref 0.0–1.2)
Calcium: 9.7 mg/dL (ref 8.6–10.2)
Chloride: 105 mmol/L (ref 96–106)
Creatinine, Ser: 1.22 mg/dL (ref 0.76–1.27)
GFR calc Af Amer: 71 mL/min/{1.73_m2} (ref >59)
GFR calc non Af Amer: 62 mL/min/{1.73_m2} (ref >59)
Globulin, Total: 2.7 g/dL (ref 1.5–4.5)
Glucose: 86 mg/dL (ref 65–99)
Potassium: 4 mmol/L (ref 3.5–5.2)
Sodium: 142 mmol/L (ref 134–144)
Total Protein: 7.3 g/dL (ref 6.0–8.5)

## 2018-04-26 LAB — 25-HYDROXY VITAMIN D LCMS D2+D3
25-Hydroxy, Vitamin D-2: <1 ng/mL
25-Hydroxy, Vitamin D-3: 31 ng/mL
25-Hydroxy, Vitamin D: 31 ng/mL

## 2018-04-26 LAB — SEDIMENTATION RATE: Sed Rate: 25 mm/hr (ref 0–30)

## 2018-04-26 LAB — VITAMIN B12: Vitamin B-12: 380 pg/mL (ref 232–1245)

## 2018-04-26 LAB — MAGNESIUM: Magnesium: 2.2 mg/dL (ref 1.6–2.3)

## 2018-04-26 LAB — C-REACTIVE PROTEIN: CRP: <1 mg/L (ref 0–10)

## 2018-05-05 NOTE — Progress Notes (Signed)
Patient's Name: Chris Johnson  MRN: [id number]  Referring Provider: [facility]  DOB: [birth year]  PCP: [facility] [place]  DOS: 05/06/2018  Note by: Gaspar Cola, MD  Service setting: Ambulatory outpatient  Specialty: Interventional Pain Management  Location: [facility] (AMB) Pain Management Facility    Patient type: Established   Primary Reason(s) for Visit: Encounter for evaluation before starting new chronic pain management plan of care (Level of risk: moderate) CC: Leg Pain (bilateral, lower) and Foot Pain (bilateral)  HPI  Chris Johnson is a 68 y.o. year old, male patient, who comes today for a follow-up evaluation to review the test results and decide on a treatment plan. He has Hypertension; Insomnia; Depression with anxiety; Family history of prostate cancer; Hyperlipidemia; Allergic rhinitis; Annual physical exam; Cough; Chronic gouty arthropathy without tophi; Chronic kidney disease (CKD), stage III (moderate) ([place]); HLD (hyperlipidemia); Encounter for long-term (current) use of high-risk medication; Chronic lower extremity pain (Primary Area of Pain) (Bilateral) (L>R); Chronic ankle pain (Secondary Area of Pain) (Bilateral) (L>R); Chronic elbow pain (Tertiary Area of Pain) (Bilateral) (R>L); Chronic pain syndrome; Pharmacologic therapy; Disorder of skeletal system; Problems influencing health status; Polycystic kidney disease; and Bilateral calf pain on their problem list. His primarily concern today is the Leg Pain (bilateral, lower) and Foot Pain (bilateral)  Pain Assessment: Location: Right, Left, Lower Leg Radiating: denies Onset: More than a month ago Duration: Chronic pain Quality: Constant, Pressure(stiff) Severity: 3 /10 (subjective, self-reported pain score)  Note: Reported level is compatible with observation.                         When using our objective Pain Scale, levels between 6 and 10/10 are said to belong in an emergency room, as it  progressively worsens from a 6/10, described as severely limiting, requiring emergency care not usually available at an outpatient pain management facility. At a 6/10 level, communication becomes difficult and requires great effort. Assistance to reach the emergency department may be required. Facial flushing and profuse sweating along with potentially dangerous increases in heart rate and blood pressure will be evident. Timing: Constant Modifying factors: moving the legs, stretching, warm bath BP: 121/75  HR: 76  Chris Johnson comes in today for a follow-up visit after his initial evaluation on 04/22/2018. Today we went over the results of his tests. These were explained in "Layman's terms". During today's appointment we went over my diagnostic impression, as well as the proposed treatment plan.  According to the patient his primary area of pain is in his lower legs (B) (R>L); calf.  He denies any precipitating factors.  He said he was diagnosed and treated for gout. He denies any previous surgery, interventional therapy or physical therapy.  He did treatment by chiropractor years ago.  He admits that massage therapy is effective for his pain. Pain is from just below the knee, down to feet. Pain is worse at night.   His second area of pain is in his feet and ankles (B) (R>L).  He admits that he had some swelling in his great toe and arches.  He has been diagnosed and treated with gout.  He did have injection (Right big toe joint) by Dr. Elvina Mattes which was not effective.  He admits the use of oral steroids was not effective.  He has made some dietary changes.   Denies any surgery.  He has x-ray of his right foot.  Third area pain is in his  elbow (B) (R>L).  He admits the right is greater than the left.  He was diagnosed with bone spurs.  Denies any previous surgery.  He did have aspiration of his right elbow with [facility] orthopedics.  He admits x-rays were completed in [place]. [place] has  proposed to remove the bursa bilaterally.  He describes the worst pain to primarily affecting him at bedtime.  He uses a pillow between the legs, but he does not put a pillow to brace his legs.  He indicates that bedtime is when his pain is at its worse.  The pain is described to be in the area of the calfs, sparing the anterior portion of the distal legs.  It does not seem to have the distribution of a peripheral neuropathy and furthermore he does not seem to have any evidence of having diabetes, or a vitamin B12 deficiency.  Physical exam primarily was positive for bilateral Homans sign, raising the possibility of DVT.  However, he does take an aspirin at bedtime and he appears to have had this for quite some time without any other problems usually associated with blood clots.  His peripheral pulses were excellent for the dorsalis pedis and the posterior tibialis.  He has good bilateral capillary filling of his toenails and there is no evidence of peripheral cyanosis.  He initially thought that this was secondary to gout, but this is not the type of pain patterns that we see with gout which usually affects primarily joints.  He does have gout affecting his feet, but this seems to be completely separate to his calf pain.  Today I have order some ultrasounds of the lower extremities to evaluate the possibility of some type of venous or arterial peripheral vascular disease.  Above all, I would like to rule out the possibility that he may be having blood clots in the area.  In considering the treatment plan options, Chris Johnson was reminded that I no longer take patients for medication management only. I asked him to let me know if he had no intention of taking advantage of the interventional therapies, so that we could make arrangements to provide this space to someone interested. I also made it clear that undergoing interventional therapies for the purpose of getting pain medications is very inappropriate on the  part of a patient, and it will not be tolerated in this practice. This type of behavior would suggest true addiction and therefore it requires referral to an addiction specialist.   Further details on both, my assessment(s), as well as the proposed treatment plan, please see below.  Controlled Substance Pharmacotherapy Assessment REMS (Risk Evaluation and Mitigation Strategy)  Analgesic: None Highest recorded MME/day: 30 mg/day MME/day: 0 mg/day  Pill Count: None expected due to no prior prescriptions written by our practice. Landis Martins, RN  05/06/2018  9:51 AM  Sign when Signing Visit Safety precautions to be maintained throughout the outpatient stay will include: orient to surroundings, keep bed in low position, maintain call bell within reach at all times, provide assistance with transfer out of bed and ambulation.    Pharmacokinetics: Liberation and absorption (onset of action): WNL Distribution (time to peak effect): WNL Metabolism and excretion (duration of action): WNL         Pharmacodynamics: Desired effects: Analgesia: Chris Johnson reports >50% benefit. Functional ability: Patient reports that medication allows him to accomplish basic ADLs Clinically meaningful improvement in function (CMIF): Sustained CMIF goals met Perceived effectiveness: Described as relatively effective, allowing  for increase in activities of daily living (ADL) Undesirable effects: Side-effects or Adverse reactions: None reported Monitoring: [place] PMP: Online review of the past 8-monthperiod previously conducted. Not applicable at this point since we have not taken over the patient's medication management yet. List of other Serum/Urine Drug Screening Test(s):  No results found for: AMPHSCRSER, BARBSCRSER, BENZOSCRSER, COCAINSCRSER, COCAINSCRNUR, PCPSCRSER, THCSCRSER, THCU, CANNABQUANT, OEmmons OMatador PMiguel Barrera EAdrianList of all UDS test(s) done:  Lab Results  Component Value Date    SUMMARY FINAL 04/22/2018   Last UDS on record: Summary  Date Value Ref Range Status  04/22/2018 FINAL  Final    Comment:    ==================================================================== TOXASSURE COMP DRUG ANALYSIS,UR ==================================================================== Test                             Result       Flag       Units Drug Present and Declared for Prescription Verification   Zolpidem                       PRESENT      EXPECTED   Zolpidem Acid                  PRESENT      EXPECTED    Zolpidem acid is an expected metabolite of zolpidem. ==================================================================== Test                      Result    Flag   Units      Ref Range   Creatinine              145              mg/dL      >=20 ==================================================================== Declared Medications:  The flagging and interpretation on this report are based on the  following declared medications.  Unexpected results may arise from  inaccuracies in the declared medications.  **Note: The testing scope of this panel does not include small to  moderate amounts of these reported medications:  Zolpidem (Ambien)  **Note: The testing scope of this panel does not include following  reported medications:  Allopurinol (Zyloprim)  Amlodipine Besylate  Colchicine  Losartan (Cozaar)  Rosuvastatin (Crestor) ==================================================================== For clinical consultation, please call ([phone] ====================================================================    UDS interpretation: No unexpected findings.          Medication Assessment Form: Patient introduced to form today Treatment compliance: Treatment may start today if patient agrees with proposed plan. Evaluation of compliance is not applicable at this point Risk Assessment Profile: Aberrant behavior: See initial evaluations. None observed or  detected today Comorbid factors increasing risk of overdose: See initial evaluation. No additional risks detected today Opioid risk tool (ORT):  Opioid Risk  04/22/2018  Alcohol 0  Illegal Drugs 0  Rx Drugs 0  Alcohol 0  Illegal Drugs 0  Rx Drugs 0  Age between 16-45 years  0  History of Preadolescent Sexual Abuse 0  Psychological Disease 0  Depression 0  Opioid Risk Tool Scoring 0  Opioid Risk Interpretation Low Risk    ORT Scoring interpretation table:  Score <3 = Low Risk for SUD  Score between 4-7 = Moderate Risk for SUD  Score >8 = High Risk for Opioid Abuse   Risk of substance use disorder (SUD): Low  Risk Mitigation Strategies:  Patient opioid safety counseling: Completed today. Counseling  provided to patient as per "Patient Counseling Document". Document signed by patient, attesting to counseling and understanding Patient-Prescriber Agreement (PPA): Obtained today.  Controlled substance notification to other providers: Written and sent today.  Pharmacologic Plan: Today we may be taking over the patient's pharmacological regimen. See below.             Laboratory Chemistry  Inflammation Markers (CRP: Acute Phase) (ESR: Chronic Phase) Lab Results  Component Value Date   CRP <1 04/22/2018   ESRSEDRATE 25 04/22/2018                         Rheumatology Markers No results found.  Renal Function Markers Lab Results  Component Value Date   BUN 14 04/22/2018   CREATININE 1.22 04/22/2018   BCR 11 04/22/2018   GFRAA 71 04/22/2018   GFRNONAA 62 04/22/2018                             Hepatic Function Markers Lab Results  Component Value Date   AST 24 04/22/2018   ALT 12 12/14/2015   ALBUMIN 4.6 04/22/2018   ALKPHOS 83 04/22/2018                        Electrolytes Lab Results  Component Value Date   NA 142 04/22/2018   K 4.0 04/22/2018   CL 105 04/22/2018   CALCIUM 9.7 04/22/2018   MG 2.2 04/22/2018                        Neuropathy Markers Lab  Results  Component Value Date   VITAMINB12 380 04/22/2018   HGBA1C 5.5 06/19/2012                        CNS Tests No results found.  Bone Pathology Markers Lab Results  Component Value Date   25OHVITD1 31 04/22/2018   25OHVITD2 <1.0 04/22/2018   25OHVITD3 31 04/22/2018                         Coagulation Parameters Lab Results  Component Value Date   PLT 262.0 12/14/2015                        Cardiovascular Markers Lab Results  Component Value Date   TROPONINI <0.01 05/24/2012   HGB 15.3 12/14/2015   HCT 44.4 12/14/2015                         CA Markers No results found.  Endocrine Markers Lab Results  Component Value Date   TSH 1.35 06/23/2013                        Note: Lab results reviewed.  Recent Diagnostic Imaging Review  Foot Imaging: Foot-L DG Complete:  Results for orders placed during the hospital encounter of 04/23/18  DG Foot Complete Left   Narrative CLINICAL DATA:  Chronic pain in both extremities  EXAM: LEFT FOOT - COMPLETE 3+ VIEW  COMPARISON:  None.  FINDINGS: Early joint space narrowing in the 1st MTP joint. No acute bony abnormality. Specifically, no fracture, subluxation, or dislocation. No bony erosions. Posterior calcaneal spur noted.  IMPRESSION: No acute bony abnormality. Early joint space narrowing in  the 1st MTP joint.   Electronically Signed   By: Rolm Baptise M.D.   On: 04/23/2018 15:42    Complexity Note: Imaging results reviewed. Results shared with Chris Johnson, using Layman's terms.                         Meds   Current Outpatient Medications:  .  allopurinol (ZYLOPRIM) 100 MG tablet, Take 200 mg by mouth daily., Disp: , Rfl:  .  amLODipine (NORVASC) 10 MG tablet, Take 1 tablet (10 mg total) by mouth daily., Disp: 30 tablet, Rfl: 6 .  colchicine 0.6 MG tablet, Take 0.6 mg by mouth 2 (two) times daily., Disp: , Rfl:  .  losartan (COZAAR) 100 MG tablet, Take 1 tablet (100 mg total) by mouth daily., Disp:  90 tablet, Rfl: 3 .  rosuvastatin (CRESTOR) 20 MG tablet, Take 1 tablet (20 mg total) by mouth daily., Disp: 90 tablet, Rfl: 3 .  baclofen (LIORESAL) 10 MG tablet, Take 1 tablet (10 mg total) by mouth 3 (three) times daily for 30 days., Disp: 90 tablet, Rfl: 0 .  Melatonin 10 MG CAPS, Take 20 mg by mouth at bedtime as needed for up to 30 days., Disp: 30 capsule, Rfl: 0 .  pregabalin (LYRICA) 25 MG capsule, Take 1 capsule (25 mg total) by mouth at bedtime for 30 days., Disp: 30 capsule, Rfl: 0  ROS  Constitutional: Denies any fever or chills Gastrointestinal: No reported hemesis, hematochezia, vomiting, or acute GI distress Musculoskeletal: Denies any acute onset joint swelling, redness, loss of ROM, or weakness Neurological: No reported episodes of acute onset apraxia, aphasia, dysarthria, agnosia, amnesia, paralysis, loss of coordination, or loss of consciousness  Allergies  Chris Johnson is allergic to atorvastatin and celexa [citalopram hydrobromide].  PFSH  Drug: Chris Johnson  reports no history of drug use. Alcohol:  reports current alcohol use of about 5.0 standard drinks of alcohol per week. Tobacco:  reports that he has never smoked. He has never used smokeless tobacco. Medical:  has a past medical history of Arthritis, Gout (10/25/2017), Hyperlipidemia, Hypertension, and Normal cardiac stress test (1995). Surgical: Chris Johnson  has a past surgical history that includes Varicose vein surgery and colonoscopy. Family: family history includes Alcohol abuse in his mother; Arthritis in his father; Breast cancer in his sister; Heart attack in his mother; Hypertension in his mother; Prostate cancer in his father; Skin cancer in his mother; Stroke in his mother.  Constitutional Exam  General appearance: Well nourished, well developed, and well hydrated. In no apparent acute distress Vitals:   05/06/18 0947  BP: 121/75  Pulse: 76  Resp: 14  Temp: 98 F (36.7 C)  TempSrc: Oral  SpO2: 97%   Weight: 191 lb (86.6 kg)  Height: '5\' 11"'  (1.803 m)   BMI Assessment: Estimated body mass index is 26.64 kg/m as calculated from the following:   Height as of this encounter: '5\' 11"'  (1.803 m).   Weight as of this encounter: 191 lb (86.6 kg).  BMI interpretation table: BMI level Category Range association with higher incidence of chronic pain  <18 kg/m2 Underweight   18.5-24.9 kg/m2 Ideal body weight   25-29.9 kg/m2 Overweight Increased incidence by 20%  30-34.9 kg/m2 Obese (Class I) Increased incidence by 68%  35-39.9 kg/m2 Severe obesity (Class II) Increased incidence by 136%  >40 kg/m2 Extreme obesity (Class III) Increased incidence by 254%   Patient's current BMI Ideal Body weight  Body mass  index is 26.64 kg/m. Ideal body weight: 75.3 kg (166 lb 0.1 oz) Adjusted ideal body weight: 79.8 kg (176 lb 0.1 oz)   BMI Readings from Last 4 Encounters:  05/06/18 26.64 kg/m  04/22/18 26.64 kg/m  10/03/16 26.00 kg/m  12/14/15 25.44 kg/m   Wt Readings from Last 4 Encounters:  05/06/18 191 lb (86.6 kg)  04/22/18 191 lb (86.6 kg)  10/03/16 186 lb 6.4 oz (84.6 kg)  12/14/15 182 lb 6 oz (82.7 kg)  Psych/Mental status: Alert, oriented x 3 (person, place, & time)       Eyes: PERLA Respiratory: No evidence of acute respiratory distress  Cervical Spine Area Exam  Skin & Axial Inspection: No masses, redness, edema, swelling, or associated skin lesions Alignment: Symmetrical Functional ROM: Unrestricted ROM      Stability: No instability detected Muscle Tone/Strength: Functionally intact. No obvious neuro-muscular anomalies detected. Sensory (Neurological): Unimpaired Palpation: No palpable anomalies              Upper Extremity (UE) Exam    Side: Right upper extremity  Side: Left upper extremity  Skin & Extremity Inspection: Skin color, temperature, and hair growth are WNL. No peripheral edema or cyanosis. No masses, redness, swelling, asymmetry, or associated skin lesions. No  contractures.  Skin & Extremity Inspection: Skin color, temperature, and hair growth are WNL. No peripheral edema or cyanosis. No masses, redness, swelling, asymmetry, or associated skin lesions. No contractures.  Functional ROM: Unrestricted ROM          Functional ROM: Unrestricted ROM          Muscle Tone/Strength: Functionally intact. No obvious neuro-muscular anomalies detected.  Muscle Tone/Strength: Functionally intact. No obvious neuro-muscular anomalies detected.  Sensory (Neurological): Unimpaired          Sensory (Neurological): Unimpaired          Palpation: No palpable anomalies              Palpation: No palpable anomalies              Provocative Test(s):  Phalen's test: deferred Tinel's test: deferred Apley's scratch test (touch opposite shoulder):  Action 1 (Across chest): deferred Action 2 (Overhead): deferred Action 3 (LB reach): deferred   Provocative Test(s):  Phalen's test: deferred Tinel's test: deferred Apley's scratch test (touch opposite shoulder):  Action 1 (Across chest): deferred Action 2 (Overhead): deferred Action 3 (LB reach): deferred    Thoracic Spine Area Exam  Skin & Axial Inspection: No masses, redness, or swelling Alignment: Symmetrical Functional ROM: Unrestricted ROM Stability: No instability detected Muscle Tone/Strength: Functionally intact. No obvious neuro-muscular anomalies detected. Sensory (Neurological): Unimpaired Muscle strength & Tone: No palpable anomalies  Lumbar Spine Area Exam  Skin & Axial Inspection: No masses, redness, or swelling Alignment: Symmetrical Functional ROM: Unrestricted ROM       Stability: No instability detected Muscle Tone/Strength: Functionally intact. No obvious neuro-muscular anomalies detected. Sensory (Neurological): Unimpaired Palpation: No palpable anomalies       Provocative Tests: Hyperextension/rotation test: deferred today       Lumbar quadrant test (Kemp's test): deferred today       Lateral  bending test: deferred today       Patrick's Maneuver: deferred today                   FABER* test: deferred today                   S-I anterior distraction/compression test: deferred today  S-I lateral compression test: deferred today         S-I Thigh-thrust test: deferred today         S-I Gaenslen's test: deferred today         *(Flexion, ABduction and External Rotation)  Gait & Posture Assessment  Ambulation: Unassisted Gait: Relatively normal for age and body habitus Posture: WNL   Lower Extremity Exam    Side: Right lower extremity  Side: Left lower extremity  Stability: No instability observed          Stability: No instability observed          Skin & Extremity Inspection: Skin color, temperature, and hair growth are WNL. No peripheral edema or cyanosis. No masses, redness, swelling, asymmetry, or associated skin lesions. No contractures. Excellent posterior tibialis and dorsalis pedis pulses. (+) Homan's sign.  Skin & Extremity Inspection: Skin color, temperature, and hair growth are WNL. No peripheral edema or cyanosis. No masses, redness, swelling, asymmetry, or associated skin lesions. No contractures. Excellent posterior tibialis and dorsalis pedis pulses. (+) Homan's sign.  Functional ROM: Unrestricted ROM                  Functional ROM: Unrestricted ROM                  Muscle Tone/Strength: Functionally intact. No obvious neuro-muscular anomalies detected.  Muscle Tone/Strength: Functionally intact. No obvious neuro-muscular anomalies detected.  Sensory (Neurological): Unimpaired        Sensory (Neurological): Unimpaired        DTR: Patellar: deferred today Achilles: deferred today Plantar: deferred today  DTR: Patellar: deferred today Achilles: deferred today Plantar: deferred today  Palpation: No palpable anomalies  Palpation: No palpable anomalies   Assessment & Plan  Primary Diagnosis & Pertinent Problem List: The primary encounter diagnosis was  Chronic pain syndrome. Diagnoses of Chronic lower extremity pain (Primary Area of Pain) (Bilateral) (L>R), Chronic ankle pain (Secondary Area of Pain) (Bilateral) (L>R), Chronic elbow pain (Tertiary Area of Pain) (Bilateral) (R>L), Chronic gouty arthropathy without tophi, Disorder of skeletal system, Polycystic kidney disease, Bilateral calf pain, and Insomnia, unspecified type were also pertinent to this visit.  Visit Diagnosis: 1. Chronic pain syndrome   2. Chronic lower extremity pain (Primary Area of Pain) (Bilateral) (L>R)   3. Chronic ankle pain (Secondary Area of Pain) (Bilateral) (L>R)   4. Chronic elbow pain (Tertiary Area of Pain) (Bilateral) (R>L)   5. Chronic gouty arthropathy without tophi   6. Disorder of skeletal system   7. Polycystic kidney disease   8. Bilateral calf pain   9. Insomnia, unspecified type    Problems updated and reviewed during this visit: Problem  Bilateral Calf Pain  Chronic lower extremity pain (Primary Area of Pain) (Bilateral) (L>R)  Chronic ankle pain (Secondary Area of Pain) (Bilateral) (L>R)  Chronic elbow pain (Tertiary Area of Pain) (Bilateral) (R>L)  Chronic Pain Syndrome  Chronic Gouty Arthropathy Without Tophi  Polycystic Kidney Disease  Pharmacologic Therapy  Disorder of Skeletal System  Problems Influencing Health Status  Chronic Kidney Disease (Ckd), Stage III (Moderate) ([facility])  Encounter for Long-Term (Current) Use of High-Risk Medication  Insomnia  Hld (Hyperlipidemia)  Cough  Annual Physical Exam  Allergic Rhinitis  Family History of Prostate Cancer  Hyperlipidemia  Depression With Anxiety  Hypertension  Routine General Medical Examination At [place] (Resolved)  Loss of Weight (Resolved)  [place] Stools (Resolved)  Unspecified Gastritis and Gastroduodenitis Without Mention of Hemorrhage (  Resolved)  Atypical Chest Pain (Resolved)  Skin Lesion (Resolved)  Hyperlipidemia (Resolved)  Right Otitis Media  (Resolved)  Bilateral Otitis Externa (Resolved)  Sinusitis Acute (Resolved)  Conjunctivitis of Both Eyes (Resolved)  Dizziness (Resolved)  Left Otitis Media (Resolved)    Plan of Care  Pharmacotherapy (Medications Ordered): Meds ordered this encounter  Medications  . baclofen (LIORESAL) 10 MG tablet    Sig: Take 1 tablet (10 mg total) by mouth 3 (three) times daily for 30 days.    Dispense:  90 tablet    Refill:  0    Do not place this medication, or any other prescription from our practice, on "Automatic Refill". Patient may have prescription filled one day early if pharmacy is closed on scheduled refill date.  . Melatonin 10 MG CAPS    Sig: Take 20 mg by mouth at bedtime as needed for up to 30 days.    Dispense:  30 capsule    Refill:  0    Do not add to the electronic "Automatic Refill" notification system. Patient may have prescription filled one day early if pharmacy is closed on scheduled refill date.  . pregabalin (LYRICA) 25 MG capsule    Sig: Take 1 capsule (25 mg total) by mouth at bedtime for 30 days.    Dispense:  30 capsule    Refill:  0   Procedure Orders    No procedure(s) ordered today   Lab Orders  No laboratory test(s) ordered today    Imaging Orders     US ARTERIAL LOWER EXTREMITY DUPLEX BILATERAL     US Venous Img Lower Bilateral Referral Orders  No referral(s) requested today    Pharmacological management options:  Opioid Analgesics: I will not be prescribing any opioids at this time Membrane stabilizer: Lyrica 25 mg p.o. at bedtime trial Muscle relaxant: Baclofen 10 mg p.o. daily to 3 times daily file NSAID: None prescribed at this time Other analgesic(s): None prescribed at this time   Interventional management options: Planned, scheduled, and/or pending:    Ultrasound of the lower extremities to evaluate the possibility of peripheral vascular disease and/or DVT. Today we will start the patient on Lyrica 25 mg p.o. at bedtime. Today we will  start a trial of baclofen 10 mg p.o. 3 times daily.  The patient was instructed to start taking it only at bedtime and move into the daytime, only if needed. Today I have recommended that he stop taking the Ambien and we will provide him instead with melatonin 10 to 20 mg p.o. at bedtime. I will see the patient back in approximately 2 weeks to see how he is doing with these changes. Today I have also provided him with information about a gout diet.  He was instructed to continue taking his gout medication as he has been doing.   Considering:   Diagnostic bilateral intra-articular elbow injections  Additional interventional therapy to be decided with the results of test and x-rays    PRN Procedures:   None at this time   Provider-requested follow-up: Return in about 2 weeks (around 05/20/2018) for Med-Mgmt.  Future Appointments  Date Time Provider [place]  05/09/2018 11:00 AM Billey Co, MD [facility] None  05/20/2018  8:45 AM Milinda Pointer, MD [facility] None    Primary Care Physician: [facility] [place] Location: [facility] Note by: Gaspar Cola, MD Date: 05/06/2018; Time: 11:17 AM

## 2018-05-06 ENCOUNTER — Ambulatory Visit
Admission: RE | Admit: 2018-05-06 | Discharge: 2018-05-06 | Disposition: A | Discharge status: Home / Self Care | Payer: Managed Care, Other (non HMO) | Source: Ambulatory Visit | Attending: Pain Medicine | Admitting: Pain Medicine

## 2018-05-06 ENCOUNTER — Encounter: Payer: Self-pay | Admitting: Pain Medicine

## 2018-05-06 ENCOUNTER — Other Ambulatory Visit: Payer: Self-pay

## 2018-05-06 ENCOUNTER — Ambulatory Visit: Payer: Managed Care, Other (non HMO) | Admitting: Pain Medicine

## 2018-05-06 VITALS — BP 121/75 | HR 76 | Temp 98.0°F | Resp 14 | Ht 71.0 in | Wt 191.0 lb

## 2018-05-06 DIAGNOSIS — M79604 Pain in right leg: Secondary | ICD-10-CM | POA: Insufficient documentation

## 2018-05-06 DIAGNOSIS — G8929 Other chronic pain: Secondary | ICD-10-CM | POA: Insufficient documentation

## 2018-05-06 DIAGNOSIS — M25522 Pain in left elbow: Secondary | ICD-10-CM | POA: Insufficient documentation

## 2018-05-06 DIAGNOSIS — M899 Disorder of bone, unspecified: Secondary | ICD-10-CM

## 2018-05-06 DIAGNOSIS — M1A00X0 Idiopathic chronic gout, unspecified site, without tophus (tophi): Secondary | ICD-10-CM | POA: Insufficient documentation

## 2018-05-06 DIAGNOSIS — M25572 Pain in left ankle and joints of left foot: Secondary | ICD-10-CM | POA: Insufficient documentation

## 2018-05-06 DIAGNOSIS — M79661 Pain in right lower leg: Secondary | ICD-10-CM | POA: Insufficient documentation

## 2018-05-06 DIAGNOSIS — M79662 Pain in left lower leg: Secondary | ICD-10-CM | POA: Insufficient documentation

## 2018-05-06 DIAGNOSIS — G894 Chronic pain syndrome: Secondary | ICD-10-CM | POA: Diagnosis not present

## 2018-05-06 DIAGNOSIS — M79605 Pain in left leg: Secondary | ICD-10-CM | POA: Insufficient documentation

## 2018-05-06 DIAGNOSIS — M25521 Pain in right elbow: Secondary | ICD-10-CM

## 2018-05-06 DIAGNOSIS — Q613 Polycystic kidney, unspecified: Secondary | ICD-10-CM | POA: Insufficient documentation

## 2018-05-06 DIAGNOSIS — M25571 Pain in right ankle and joints of right foot: Secondary | ICD-10-CM | POA: Diagnosis not present

## 2018-05-06 DIAGNOSIS — G4700 Insomnia, unspecified: Secondary | ICD-10-CM | POA: Insufficient documentation

## 2018-05-06 MED ORDER — BACLOFEN 10 MG PO TABS: 10.0000 mg | ORAL_TABLET | Freq: Three times a day (TID) | ORAL | 0 refills | Status: DC

## 2018-05-06 MED ORDER — PREGABALIN 25 MG PO CAPS: 25.0000 mg | ORAL_CAPSULE | Freq: Every day | ORAL | 0 refills | Status: DC

## 2018-05-06 MED ORDER — MELATONIN 10 MG PO CAPS: 20.0000 mg | ORAL_CAPSULE | Freq: Every evening | ORAL | 0 refills | Status: DC | PRN

## 2018-05-06 NOTE — Patient Instructions (Signed)
____________________________________________________________________________________________  Gout    Mechanism: Uric acid accumulation.    Uric Acid: Uric acid is a heterocyclic compound of carbon, nitrogen, oxygen, and hydrogen with the formula [id number]. It forms ions and salts known as urates and acid urates such as ammonium acid urate. Uric acid is a product of the metabolic breakdown of purine nucleotides. High blood concentrations of uric acid can lead to gout. The chemical is associated with other medical conditions including diabetes and the formation of ammonium acid urate kidney stones.    Purines: Purines are found in high concentration in meat and meat products, especially internal organs such as liver and kidney. In general, plant-based diets are low in purines. Examples of high-purine sources include: sweetbreads, anchovies, sardines, liver, beef kidneys, brains, meat extracts (e.g., Oxo, Bovril), herring, mackerel, scallops, game meats, beer (from the yeast) and gravy. A moderate amount of purine is also contained in beef, pork, poultry, other fish and seafood, asparagus, cauliflower, spinach, mushrooms, green peas, lentils, dried peas, beans, oatmeal, wheat bran, wheat germ, and hawthorn. Higher levels of meat and seafood consumption are associated with an increased risk of gout, whereas a higher level of consumption of dairy products is associated with a decreased risk. Moderate intake of purine-rich vegetables or protein is not associated with an increased risk of gout.    Causes: Uric acid is generated as the body's tissues are broken down during normal cell turnover. Some people with gout generate too much uric acid (10%). Other patients with gout do not effectively eliminate their uric acid into the urine (90%). Genetics, gender,and nutrition (alcoholism, obesity) play key roles in the development of gout.   1. If your parents have gout, then you have a 20% chance of developing it.    2. British people are 5 times more likely to develop gout. 3. American blacks, but not African blacks, are more likely to have gout than other populations. 4. Use of alcohol, especially beer, increases the risk for gout.   5. Diets rich in red meats, internal organs, yeast, and oily fish increase the risk for gout.   6. Uric acid levels increase at puberty in men and at menopause in women, so men first develop gout at an earlier age (30s to 50s) than do women (50s to 70s). Gout in pre-menopausal women is distinctly unusual.   7. Attacks of gouty arthritis?can be precipitated when there is a sudden change in uric acid levels.   8. Overindulgence of alcohol and red meats   9. Trauma   10. Starvation and dehydration 11. IV contrast dyes   12. Chemotherapy     Some Possible Causes of Elevated Uric Acid Levels  Medication Diuretics used for weight loss or heart disease, insulin, some antibiotics, medication for rheumatoid arthritis, or an overdose of B vitamins can cause uric acid levels to rise. Diuretics reduce sodium, magnesium, calcium and potassium (among other things) levels. If you need to use a diuretic, see our natural herbal products for ones with fewer side effects. One customer reported getting gout when he took beta-blockers for his high blood pressure.     Poor kidney function When kidneys are not functioning at optimum levels, they lose their ability to excrete uric acid from the body. This situation may be due to various kidney problems or over-consumption of alcohol. When alcohol is metabolized, lactic acid is produced, which hinders uric acid excretion by the kidneys.     Dieting Severe dieting or fasting can cause excess lactic   acid, which hinders uric acid excretion by the kidneys. Crash and severe calorie restriction diets shock your metabolism and can trigger a gout attack. Dieting may also cause a loss of potassium, which can increase urate levels in the blood. As mentioned  above, some dieters also use diuretics to speed the process, and they can rob the body of potassium and other minerals, triggering a gout attack. It seems to be a vicious circle! However, a proper diet that is done slowly is recommended because losing weight will reduce serum levels of uric acid.    Diet Traditional thinking tells us that gout is the result of excessive amounts of alcohol, protein, heavy foods, coffee and soft drinks in your diet. Certain foods contain high levels of purine which can cause uric acid levels to rise. Purine is a protein substance that is transformed into uric acid during digestion. Reduction in consumption of these foods is very often successful in reducing or eliminating gout.     A potassium deficiency can increase urate levels in the blood. This is very important, and ways to correct it?are discussed above and under the diuretics section.    Drugs that increase serum uric acid  1. Aspirin (Low dose)  2. Diuretics  3. hypertensive medications   4. Nicotinic acid   5. Cyclosporine A   6. Acetaminophen (Tylenol)  7. Others     Pharmacological treatment:  1) Colchicine (PO)  Adverse Side Effects: nausea, vomiting, diarrhea  MAX: 6 mg/day during an acute attack  Goal: keep serum urate < 7.0 mg/dl Prophylactic Dose: 0.6 -1.2 mg/day  2) Probenecid (Uricosuric properties)  Mechanism of Action: increases uric acid excretion  Adverse Side Effects: overt nephrolithiasis (Kidney stones). Side effects of probenecid are uncommon and usually mild. In addition to causing kidney stones and precipitating acute gouty arthritis, side effects of probenecid include hair loss, skin rash, headache, nausea, sore gums, and fever. In rare instances, it has caused severe anemias  To Avoid Stones:  start at low doses  Stay well hydrated  Alkalinize urine  1) Sodium Bicarbonate  2) And/or Acetazolamide (Carbonic anhydrate inhibitor) Starting Dose: 250 mg bid and increase over  several weeks  3) Allopurinol  Mechanism of Action: Decreases uric acid production  Adverse Side Effects: fever, dermatitis, elevated liver enzymes, diarrhea, and vasculitis. The most frequent adverse reaction to allopurinol is skin rash. Allopurinol should be discontinued immediately at the first appearance of rash, painful urination, blood in the urine, eye irritation, or swelling of the mouth or lips, because these can be a signs of impending severe allergic reaction, which can be fatal. Rarely, allopurinol can cause nerve, kidney, and bone marrow damage.  Dose: 300 mg/day   Treatment  1. Drink 2 to 3 L of fluid daily.  2. Consume a moderate amount of protein. Limit meat, fish and poultry to 4 - 6 oz per day. Try other low-purine good protein foods such as low fat dairy products, tofu and eggs.  3. Limit fat intake by choosing leaner meats, foods prepared with less oils and lower fat dairy products  4. Aside from avoiding high purine foods, maintaining a healthy body weight is important for gout patients as well. Obesity can result in increased uric acid production by the body. Follow a well-balanced diet to lose excess body weight. Do not follow a high-protein low-carb diet as this can worsen gout conditions.  5. Keep the urine pH high (basic or non-acidic)  6. Colchicine, probenecid, allopurinol, sodium bicarbonate.      Prevention  If you are at risk for gout, you should   1. Eat a low-cholesterol, low-fat diet. People with gout have a higher risk for heart disease. This diet would not only lower your risk for gout but also your risk for heart disease.   2. Slowly lose weight. This can lower your uric acid levels. Losing weight too rapidly can occasionally precipitate gout attacks.   3. Restrict your?intake of alcohol, especially beer.   If you have had an attack of gouty arthritis, you should do all of the above and follow the regimen prescribed by your physician. The adequate prevention of  gouty arthritis may involve lifelong medical therapy.    Balanced Diet  According to the American Medical Association, a balanced diet for people with gout include foods:  1. High in complex carbohydrates (whole grains, fruits, vegetables)   2. Low in protein (15% of calories and sources should be soy, lean meats, poultry)   3. No more than 30% of calories from fat (10% animal fat)    Beneficial Foods  Foods which may be beneficial to people with gout include:  1. Dark berries may contain chemicals that lower uric acid and reduce inflammation.   2. Tofu which is made from soybeans may be a better choice than meats.   3. Certain fatty acids found in certain fish such as salmon, flax or olive oil, or nuts may possess some anti-inflammatory benefits.    Recommended Foods to Eat  1. Fresh cherries, strawberries, blueberries, and other red-blue berries   2. Bananas   3. Celery   4. Tomatoes   5. Vegetables including kale, cabbage, parsley, green-leafy vegetables   6. Foods high in bromelain (pineapple)   7. Foods high in vitamin C (red cabbage, red bell peppers, tangerines, mandarins, oranges, potatoes)   8. Drink fruit juices and purified water (8 glasses of water per day)   9. Low-fat dairy products   10. Complex carbohydrates (breads, cereals, pasta, rice, as well as aforementioned vegetables and fruits)   11. Chocolate, cocoa   12. Coffee, tea   13. Carbonated beverages   14. Essential fatty acids (tuna and salmon, flaxseed, nuts, seeds)   15. Tofu, although a legume and made from soybeans, may be a better choice than meat    Foods to Avoid  Diets which are high in purines and high in protein have long been suspected of causing an increased risk of gout .    According to the American Medical Association, purine-containing foods include:  1. Beer, other alcoholic beverages. Limit alcohol consumption to 1 drink 3 times a week.  2. Anchovies, sardines in oil, fish roes, herring,  Mackerel, Scallops, mussels  3. Yeast. (Beer), whole grain breads and cereals, oatmeal  4. Organ meat (liver, kidneys, brains, sweetbreads)   5. Processed meats (hot dogs, lunch meats, etc.),  6. Legumes (dried beans, peas, lima beans)   7. Meat extracts, consomm, broth, bouillon, gravies. (e.g Oxo, Bovril)  8. Mushrooms, spinach, asparagus, cauliflower, mushrooms.  9. Chicken, duck, ham, turkey, Game meats   10. Fried foods, roasted nuts, any food cooked in oil (heated oil destroys vitamin E)  11. Rich foods (cakes, sugar products, white flour products)  12. Dried fruits  13. Caffeine  14. Eggs    NOTE: It is important to remember that purines are found in all protein foods. All sources of purines should not be eliminated.    Urine at pH 7.0 is neutral and elimination of   uric acid decreases by approximately 50% at pH 6.5. The pka of uric acid is 5.75.  In urine at pH 5.0, only 15% of uric acid exists in solution. The solubility increases more than 10-fold at pH 7.0 and more than 100-fold at pH 8.0.    Extremely Alkaline Forming Foods - pH 8.5 to 9.0:  Lemons, Watermelon , Agar Agar , Cantaloupe, Cayenne (Capsicum), Dried dates & figs, Kelp, Karengo, Kudzu root, Limes, Mango, Melons, Papaya, Parsley, Seedless grapes (sweet), Watercress, Seaweed    Moderate Alkaline - pH 7.5 to 8.0  Apples (sweet), Apricots, Alfalfa sprouts Arrowroot, Avocados, Bananas (ripe), Berries, Carrots, Celery, Currants, Dates & figs (fresh), Garlic , Gooseberry, Grapes (less sweet), Grapefruit, Guavas, Herbs (leafy green), Lettuce (leafy green), Nectarine, Peaches (sweet), Pears (less sweet), Peas (fresh sweet), Persimmon, Pumpkin (sweet), Sea salt , Spinach, Apples (sour), Bamboo shoots, Beans (fresh green), Beets, Bell Pepper, Broccoli, Cabbage, Cauliflower, Carob , Daikon, Ginger (fresh), Grapes (sour), Kale, Kohlrabi, Lettuce (pale green), Oranges, Parsnip, Peaches (less sweet), Peas (less sweet), Potatoes & skin,  Pumpkin (less sweet), Raspberry, Sapote, Strawberry, Squash , Sweet corn (fresh), Tamari , Turnip, Sour Dairy    Slightly Alkaline to Neutral pH 7.0  Almonds , Artichokes ([place]), Barley-Malt (sweetener-[place]), Brown Rice Syrup, Brussel Sprouts, Cherries, Coconut (fresh), Cucumbers, Egg plant, Honey (raw), Leeks, Miso, Okra, Olives ripe , Onions, Pickles (home made with brown rice vinegar), Radish, Sea salt , Spices , Taro, Tomatoes (sweet), Vinegar (sweet brown rice), Water Chestnut, Amaranth, Artichoke (globe), Chestnuts (dry roasted), Egg yolks (soft cooked), Goat's milk and whey (raw) , Horseradish, Mayonnaise (home made), Millet, Olive oil, Quinoa, Rhubarb, Sesame seeds (whole) , Soy beans (dry), Sprouted grains , Tempeh, Tofu, Tomatoes (less sweet)    Slightly Acid to Neutral pH 7.0  Barley malt syrup, Barley, Bran, Cashews, Cereals (unrefined with honey-fruit-maple syrup), Cornmeal, Fructose, Honey (pasteurized), Lentils, Macadamias, Maple syrup (unprocessed),Low Fat Milk (homogenized) and most processed dairy products, Molasses organic , Nutmeg, Mustard, Pistachios, Popcorn (plain), Rice or wheat crackers (unrefined), Rye (grain), Rye bread (organic sprouted), Seeds (pumpkin & sunflower), Walnuts Blueberries, Brazil nuts, Butter (salted), Cheeses (mild & crumbly) , Crackers (unrefined rye), Dried beans (mung, adzuki, pinto, kidney, garbanzo) , Dry coconut, Egg whites, Goats milk (homogenized), Olives (pickled), Pecans, Plums , Prunes , Butter (fresh unsalted), Cream (fresh & raw), Milk (raw cow's) , Whey (cow's)     ACID FORMING FOODS FATS & OILS  Avocado Oil, Canola Oil, Corn Oil, Hemp Seed Oil, Flax Oil, Lard, Olive Oil, Safflower Oil, Sesame Oil, Sunflower Oil    FRUITS  Cranberries    GRAINS  Rice Cakes, Wheat Cakes, Amaranth, Barley, Buckwheat, Oats (rolled), Quinoa, Rice, Rye, Spelt, Kamut, Wheat, Hemp Seed Flour    NUTS & BUTTERS  Cashews, Brazil Nuts, Peanuts, Processed Peanut  Butter, Pecans, Tahini    ANIMAL PROTEIN  Beef, Carp, Clams, Fish, Lamb, Lobster, Mussels, Oyster, Pork, Rabbit, Salmon, Shrimp, Scallops, Tuna, Turkey, Venison    PASTA (WHITE)  Noodles, Macaroni, Spaghetti Distilled Vinegar, Wheat Germ    BEANS & LEGUMES  Black Beans, Chick Peas, Green Peas, Kidney Beans, Lentils, [place] Beans, Pinto Beans, Red Beans, Soy Beans, Soy Milk, White Beans, Rice Milk, Almond Milk    DRUGS & CHEMICALS  Aspartame, Chemicals, Drugs (Medicinal), Drugs (Psychedelic), Pesticides, Herbicides    ALCOHOL  Beer, Spirits, Hard Liquor, Wine    ACTIVITIES  Overwork, Anger, Fear, Jealousy, Stress    Moderate Acid - pH 6.0 to 6.5  Cigarette tobacco, Cream of Wheat (  unrefined), Fish, Fruit juices with sugar, Maple syrup (processed), Molasses (sulphured), Pickles (commercial), Breads (refined) of corn, oats, rice & rye, Cereals (refined), corn flakes, Shellfish, Wheat germ, Whole Wheat foods , Wine , Yogurt (sweetened) Bananas (green), Buckwheat, Cheeses (sharp), Corn & rice breads, Egg whole (cooked hard), Ketchup, Mayonnaise, Oats, Pasta (whole grain), Pastry (wholegrain & honey), Peanuts, Potatoes (with no skins), Popcorn (with salt & butter), Rice (basmati), Rice (brown), Soy sauce (commercial), Tapioca, Wheat bread (sprouted organic)    Extremely Acid Forming Foods - pH 5.0 to 5.5  Artificial sweeteners, Beef, Carbonated soft drinks & fizzy drinks , Cigarettes (tailor made), Drugs, Flour (white wheat), Goat, Lamb, Pastries & cakes from white flour, Pork, Sugar (white) , Beer , Brown sugar , Chicken, [place], Chocolate, Coffee , Custard with white sugar, Jams, Jellies, Liquor , Pasta (white), Rabbit, [place], Table salt refined & iodized, Tea black, Turkey, Wheat bread, White rice, White vinegar (processed).    Research Update:   A recent study published in the [employer] Journal of Medicine on Jun 05, 2002 revealed that high intake of low-fat dairy products indeed reduces  the risk of gout by 50%. It is unknown why low-fat dairy products offer a protective effect.   Unfortunately, no natural supplements are proven effective to prevent or alleviate onset of acute gout attacks. The most effective treatment for gout attack is medication.   ____________________________________________________________________________________________   

## 2018-05-06 NOTE — Progress Notes (Signed)
Safety precautions to be maintained throughout the outpatient stay will include: orient to surroundings, keep bed in low position, maintain call bell within reach at all times, provide assistance with transfer out of bed and ambulation.  

## 2018-05-07 ENCOUNTER — Telehealth: Payer: Self-pay | Admitting: Pain Medicine

## 2018-05-07 NOTE — Telephone Encounter (Signed)
Pt called and stated that he would like to know the results of his ultrasound.

## 2018-05-07 NOTE — Telephone Encounter (Signed)
Patient read the impression on [place] results.

## 2018-05-08 ENCOUNTER — Telehealth: Payer: Self-pay | Admitting: Pain Medicine

## 2018-05-08 NOTE — Telephone Encounter (Signed)
Patient left vmail on Tue after hours stating he went to pharmacy to pick up scripts and they say they are not there. He saw Dr. Dossie Arbour on 2-10 2nd visit.   Please call patient [phone]

## 2018-05-09 ENCOUNTER — Telehealth: Payer: Self-pay | Admitting: *Deleted

## 2018-05-09 ENCOUNTER — Ambulatory Visit: Payer: Managed Care, Other (non HMO) | Admitting: Urology

## 2018-05-09 ENCOUNTER — Encounter: Payer: Self-pay | Admitting: Urology

## 2018-05-09 VITALS — BP 130/80 | HR 80 | Ht 71.0 in | Wt 191.0 lb

## 2018-05-09 DIAGNOSIS — Z125 Encounter for screening for malignant neoplasm of prostate: Secondary | ICD-10-CM

## 2018-05-09 DIAGNOSIS — R300 Dysuria: Secondary | ICD-10-CM | POA: Diagnosis not present

## 2018-05-09 LAB — URINALYSIS, COMPLETE
Bilirubin, UA: NEGATIVE
Glucose, UA: NEGATIVE
Leukocytes, UA: NEGATIVE
Nitrite, UA: NEGATIVE
RBC, UA: NEGATIVE
Specific Gravity, UA: >1.03 — ABNORMAL HIGH (ref 1.005–1.030)
Urobilinogen, Ur: 0.2 mg/dL (ref 0.2–1.0)
pH, UA: 6.5 (ref 5.0–7.5)

## 2018-05-09 NOTE — Patient Instructions (Signed)
Pelvic Pain, Male  Pelvic pain is pain in your lower abdomen, below your belly button and between your hips. The pain may start suddenly (be acute), keep coming back (recur), or last a long time (become chronic). Pelvic pain that lasts longer than six months is considered chronic. There are many possible causes of pelvic pain. Sometimes, the cause is not known.  Pelvic pain may affect your:  · Prostate gland.  · Urinary system.  · Digestive tract.  · Musculoskeletal system. Strained muscles or ligaments may cause pelvic pain.  Follow these instructions at home:    Medicines  · Take over-the-counter and prescription medicines only as told by your health care provider.  · If you were prescribed an antibiotic medicine, take it as told by your health care provider. Do not stop taking the antibiotic even if you start to feel better.  Managing pain, stiffness, and swelling    · Take warm water baths (sitz baths). Sitz baths help with relaxing your pelvic floor muscles.  ? For a sitz bath, the water only comes up to your hips and covers your buttocks. A sitz bath may done at home in a bathtub or with a portable sitz bath that fits over the toilet.  · If directed, apply heat to the affected area before you exercise. Use the heat source that your health care provider recommends, such as a moist heat pack or a heating pad.  ? Place a towel between your skin and the heat source.  ? Leave the heat on for 20-30 minutes.  ? Remove the heat if your skin turns bright red. This is especially important if you are unable to feel pain, heat, or cold. You may have a greater risk of getting burned.  General instructions  · Rest as told by your health care provider.  · Keep a journal of your pelvic pain. Write down:  ? When the pain started.  ? Where the pain is located.  ? What seems to make the pain better or worse.  ? Any symptoms you have along with the pain.  · Follow your treatment plan as told by your health care provider. This may  include:  ? Pelvic physical therapy.  ? Yoga, meditation, and exercise.  ? Biofeedback. This process trains you to manage your body's response (physiological response) through breathing techniques and relaxation methods. You will work with a therapist while machines are used to monitor your physical symptoms.  ? Acupuncture. This is a type of treatment that involves stimulating specific points on your body by inserting thin needles through your skin to treat pain.  · Keep all follow-up visits as told by your health care provider. This is important.  Contact a health care provider if:  · Medicine does not help your pain.  · Your pain comes back.  · You have new symptoms.  · You have a fever or chills.  · You are constipated.  · You have blood in your urine or stool.  · You feel weak or light-headed.  Get help right away if:  · You have sudden severe pain.  · Your pain steadily gets worse.  · You have severe pain along with fever, nausea, vomiting, or excessive sweating.  Summary  · Pelvic pain is pain in your lower abdomen, below your belly button and between your hips. There are many possible causes of pelvic pain. Sometimes, the cause is not known.  · Take over-the-counter and prescription medicines only as told   Get help right away if you have severe pain along with fever, nausea, vomiting, or excessive sweating.  Keep all follow-up visits as told by your health care provider. This is important. This information is not intended to replace advice given to you by your health care provider. Make sure you discuss any questions you have with your health care provider. Document Released: 12/06/2011 Document Revised: 08/01/2017 Document Reviewed:  08/01/2017 Elsevier Interactive Patient Education  2019 [employer].   Prostate Cancer Screening  The prostate is a walnut-sized gland that is located below the bladder and in front of the rectum in males. The function of the prostate (prostate gland) is to add fluid to semen during ejaculation. Prostate cancer is the second most common type of cancer in men. A screening test for cancer is a test that is done before cancer symptoms start. Screening can help to identify cancer at an early stage, when the cancer can be treated more easily. The recommended prostate cancer screening test is a blood test called the prostate-specific antigen (PSA) test. PSA is a protein that is made in the prostate. As you age, your prostate naturally produces more PSA. Abnormally high PSA levels may be caused by:  Prostate cancer.  An enlarged prostate that is not caused by cancer (benign prostatic hyperplasia, BPH). This condition is very common in older men.  A prostate gland infection (prostatitis).  Medicines to assist with hair growth, such as finasteride. Depending on the PSA results, you may need more tests, such as:  A physical exam to check the size of your prostate gland.  Blood and imaging tests.  A procedure to remove tissue samples from your prostate gland for testing (biopsy). Who should have screening? Screening recommendations vary based on age.  If you are younger than age 46, screening is not recommended.  If you are age 40-54 and you have no risk factors, screening is not recommended.  If you are younger than age 62, ask your health care provider if you need screening if you have one of these risk factors: ? Being of African-American descent. ? Having a family history of prostate cancer.  If you are age 65-69, talk with your health care provider about your need for screening and how often screening should be done.  If you are older than age 67, screening is not recommended. This is  because the risks that screening can cause are greater than the benefits that it may provide (risks outweigh the benefits). If you are at high risk for prostate cancer, your health care provider may recommend that you have screenings more often or start screening at a younger age. You may be at high risk if you:  Are older than age 49.  Are African-American.  Have a father, brother, or uncle who has been diagnosed with prostate cancer. The risk may be higher if your family member's cancer occurred at an early age. What are the benefits of screening? There is a small chance that screening may lower your risk of dying from prostate cancer. The chance is small because prostate cancer is typically a slow-growing cancer, and most men with prostate cancer die from a different cause. What are the risks of screening? The main risk of prostate cancer screening is diagnosing and treating prostate cancer that would never have caused any symptoms or problems (overdiagnosis and overtreatment). PSA screening cannot tell you if your PSA is high due to cancer or a different cause. A prostate biopsy is the only  procedure to diagnose prostate cancer. Even the results of a biopsy may not tell you if your cancer needs to be treated. Slow-growing prostate cancer may not need any treatment other than monitoring, so diagnosing and treating it may cause unnecessary stress or other side effects. A prostate biopsy may also cause:  Infection or fever.  A false negative. This is a result that shows that you do not have prostate cancer when you actually do have prostate cancer. Questions to ask your health care provider  When should I start prostate cancer screening?  What is my risk for prostate cancer?  How often do I need screening?  What type of screening tests do I need?  How do I get my test results?  What do my results mean?  Do I need treatment? Contact a health care provider if:  You have difficulty  urinating.  You have pain when you urinate or ejaculate.  You have blood in your urine or semen.  You have pain in your back or in the area of your prostate.  You have trouble getting or maintaining an erection (erectile dysfunction, ED). Summary  Prostate cancer is a common type of cancer in men. The prostate (prostate gland) is located below the bladder and in front of the rectum. This gland adds fluid to semen during ejaculation.  Prostate cancer screening may identify cancer at an early stage, when the cancer can be treated more easily.  The prostate-specific antigen (PSA) test is the recommended screening test for prostate cancer.  Discuss the risks and benefits of prostate cancer screening with your health care provider. If you are age 27 or older, screening is likely to lead to more risks than benefits (risks outweigh the benefits). This information is not intended to replace advice given to you by your health care provider. Make sure you discuss any questions you have with your health care provider. Document Released: 12/22/2016 Document Revised: 12/22/2016 Document Reviewed: 12/22/2016 Elsevier Interactive Patient Education  2019 [employer].

## 2018-05-09 NOTE — Telephone Encounter (Signed)
Scripts ready at [address] [facility] [address]. Patient called.

## 2018-05-09 NOTE — Progress Notes (Signed)
05/09/2018 11:52 AM   Chris Johnson 1950/06/11 [id number]  Referring provider: [facility], [facility] Chris Johnson, [place] [address]  CC: PSA screening, scrotal pain  HPI: I saw [name] in urology clinic in consultation for PSA screening and scrotal pain from Dr. Edison Pace.  He is a 68 year old male with past medical history notable for gout, hypertension, hyperlipidemia, and chronic pain.  There is a family history of prostate cancer in his father that was treated with surgery.  His most recent PSA was 1.7 in January 2020 which is stable from 1.53 in September 2017.  PSAs have ranged from 0.65-1.7 over the last few years.  He reports that a few weeks ago he had some urinary urgency and frequency with some mild burning, however this resolved.  Urine culture at that time was negative.  He denies any history of gross hematuria.  There is no history of microscopic hematuria on chart review.  He is a never smoker.  He also reports chronic mild bilateral scrotal pain.  He describes it as a dull aching pain.  There are no aggravating or alleviating factors.  Severity is mild.  He had a recent renal ultrasound for work-up of proteinuria which showed no hydronephrosis or renal masses.  Prostate measured 20 cc.   PMH: Past Medical History:  Diagnosis Date  . Arthritis   . Gout 10/25/2017  . Hyperlipidemia   . Hypertension   . Normal cardiac stress test 1995   cardiolyte    Surgical History: Past Surgical History:  Procedure Laterality Date  . colonoscopy    . VARICOSE VEIN SURGERY      Allergies:  Allergies  Allergen Reactions  . Atorvastatin     Leg pain  . Celexa [Citalopram Hydrobromide]     dizziness    Family History: Family History  Problem Relation Age of Onset  . Heart attack Mother   . Stroke Mother   . Hypertension Mother   . Skin cancer Mother   . Alcohol abuse Mother   . Prostate cancer Father   . Arthritis Father   . Breast cancer  Sister     Social History:  reports that he has never smoked. He has never used smokeless tobacco. He reports current alcohol use of about 5.0 standard drinks of alcohol per week. He reports that he does not use drugs.  ROS: Please see flowsheet from today's date for complete review of systems.  Physical Exam: 154/81, HR 62 5'11, 198lbs  Constitutional:  Alert and oriented, No acute distress. Cardiovascular: No clubbing, cyanosis, or edema. Respiratory: Normal respiratory effort, no increased work of breathing. GI: Abdomen is soft, nontender, nondistended, no abdominal masses GU: No CVA tenderness, phallus without lesions, widely patent meatus, testicles 20 cc and descended bilaterally, no masses, minimally tender to palpation. DRE: Furred Lymph: No cervical or inguinal lymphadenopathy. Skin: No rashes, bruises or suspicious lesions. Neurologic: Grossly intact, no focal deficits, moving all 4 extremities. Psychiatric: Normal mood and affect.  Laboratory Data: Reviewed Urinalysis today 0 RBCs, 0 WBCs, no bacteria, nitrite negative  Pertinent Imaging: I have personally reviewed the renal ultrasound.  No hydronephrosis, urolithiasis, renal masses.  Prostate measures 20 cc..  Assessment & Plan:   In summary, the patient is a 68 year old male with chronic pain and gout who is a family history of prostate cancer in his father.  His PSA is stable at 1.7 from 1.5 in 2017.  We discussed PSA screening at length, and I provided  reassurance that he is well within the normal range and would not warrant biopsy at this time.  We discussed different factors including sexual activity, inflammation, and infection that can cause alterations in the PSA.  He denies any urinary symptoms currently.  I recommended a trial of 1 to 2 weeks of scheduled anti-inflammatories, snug fitting underwear, and icing as needed for his chronic scrotal pain.  RTC 1 year for PSA/DRE, PVR  Billey Co,  MD  [facility] [address] [place], [place] [address] [phone]

## 2018-05-09 NOTE — Telephone Encounter (Signed)
Patient notified the scripts were sent to [facility]. Patient notified.

## 2018-05-13 ENCOUNTER — Ambulatory Visit
Admission: RE | Admit: 2018-05-13 | Discharge: 2018-05-13 | Disposition: A | Discharge status: Home / Self Care | Payer: Managed Care, Other (non HMO) | Source: Ambulatory Visit | Attending: Pain Medicine | Admitting: Pain Medicine

## 2018-05-13 DIAGNOSIS — M79604 Pain in right leg: Secondary | ICD-10-CM | POA: Insufficient documentation

## 2018-05-13 DIAGNOSIS — M79605 Pain in left leg: Secondary | ICD-10-CM | POA: Diagnosis present

## 2018-05-13 DIAGNOSIS — M79662 Pain in left lower leg: Secondary | ICD-10-CM | POA: Insufficient documentation

## 2018-05-13 DIAGNOSIS — G8929 Other chronic pain: Secondary | ICD-10-CM | POA: Diagnosis present

## 2018-05-13 DIAGNOSIS — M79661 Pain in right lower leg: Secondary | ICD-10-CM | POA: Insufficient documentation

## 2018-05-20 ENCOUNTER — Other Ambulatory Visit: Payer: Self-pay

## 2018-05-20 ENCOUNTER — Encounter: Payer: Self-pay | Admitting: Pain Medicine

## 2018-05-20 ENCOUNTER — Ambulatory Visit: Payer: Managed Care, Other (non HMO) | Attending: Pain Medicine | Admitting: Pain Medicine

## 2018-05-20 VITALS — BP 138/87 | HR 69 | Temp 98.0°F | Resp 18 | Ht 71.5 in | Wt 190.0 lb

## 2018-05-20 DIAGNOSIS — M25522 Pain in left elbow: Secondary | ICD-10-CM

## 2018-05-20 DIAGNOSIS — M25571 Pain in right ankle and joints of right foot: Secondary | ICD-10-CM | POA: Diagnosis present

## 2018-05-20 DIAGNOSIS — M1A00X0 Idiopathic chronic gout, unspecified site, without tophus (tophi): Secondary | ICD-10-CM

## 2018-05-20 DIAGNOSIS — M7918 Myalgia, other site: Secondary | ICD-10-CM

## 2018-05-20 DIAGNOSIS — G894 Chronic pain syndrome: Secondary | ICD-10-CM

## 2018-05-20 DIAGNOSIS — M792 Neuralgia and neuritis, unspecified: Secondary | ICD-10-CM

## 2018-05-20 DIAGNOSIS — G8929 Other chronic pain: Secondary | ICD-10-CM

## 2018-05-20 DIAGNOSIS — M79604 Pain in right leg: Secondary | ICD-10-CM | POA: Diagnosis present

## 2018-05-20 DIAGNOSIS — G4700 Insomnia, unspecified: Secondary | ICD-10-CM | POA: Diagnosis present

## 2018-05-20 DIAGNOSIS — N183 Chronic kidney disease, stage 3 unspecified: Secondary | ICD-10-CM

## 2018-05-20 DIAGNOSIS — M79605 Pain in left leg: Secondary | ICD-10-CM | POA: Diagnosis present

## 2018-05-20 DIAGNOSIS — M25572 Pain in left ankle and joints of left foot: Secondary | ICD-10-CM

## 2018-05-20 DIAGNOSIS — M79661 Pain in right lower leg: Secondary | ICD-10-CM

## 2018-05-20 DIAGNOSIS — M25521 Pain in right elbow: Secondary | ICD-10-CM | POA: Diagnosis present

## 2018-05-20 DIAGNOSIS — M79662 Pain in left lower leg: Secondary | ICD-10-CM

## 2018-05-20 MED ORDER — BACLOFEN 10 MG PO TABS: 10.0000 mg | ORAL_TABLET | Freq: Three times a day (TID) | ORAL | 0 refills | Status: DC

## 2018-05-20 MED ORDER — PREGABALIN 25 MG PO CAPS: 25.0000 mg | ORAL_CAPSULE | Freq: Three times a day (TID) | ORAL | 0 refills | Status: DC

## 2018-05-20 MED ORDER — MELATONIN 10 MG PO CAPS: 20.0000 mg | ORAL_CAPSULE | Freq: Every evening | ORAL | 2 refills | Status: DC | PRN

## 2018-05-20 NOTE — Progress Notes (Signed)
Safety precautions to be maintained throughout the outpatient stay will include: orient to surroundings, keep bed in low position, maintain call bell within reach at all times, provide assistance with transfer out of bed and ambulation.  

## 2018-05-20 NOTE — Progress Notes (Signed)
Patient's Name: Chris Johnson  MRN: [id number]  Referring Provider: [facility], [facility]  DOB: [birth year]  PCP: [facility], [place]: 05/20/2018  Note by: Gaspar Cola, MD  Service setting: Ambulatory outpatient  Specialty: Interventional Pain Management  Location: [facility] (AMB) Pain Management Facility    Patient type: Established   HPI  Reason for Visit: Chris Johnson is a 68 y.o. year old, male patient, who comes today with a chief complaint of Leg Pain (bilateral); Foot Pain (bilateral); and Ankle Pain (bilateral) Last Appointment: His last appointment at our practice was on 05/09/2018. I last saw him on 05/08/2018.  Pain Assessment: Today, Chris Johnson describes the severity of the Chronic pain as a 3 /10. He indicates the location/referral of the pain to be Leg Right, Left, Lower/ankles and feet , feeling of swelling. Onset was: More than a month ago. The quality of pain is described as Constant, Pressure. Temporal description, or timing of pain is: Constant. Possible modifying factors: medications, heat, moving. Chris Johnson  height is 5' 11.5" (1.816 m) and weight is 190 lb (86.2 kg). His temperature is 98 F (36.7 C). His blood pressure is 138/87 and his pulse is 69. His respiration is 18 and oxygen saturation is 97%.   On the last visit I ordered an ultrasound of the lower extremities to evaluate the possibility of peripheral vascular disease and/or DVT.  The studies came back indicating that he has no DVTs.  Today he is really doing rather well and he is excited about the results attained with the new medications.  He indicated having increase significantly his level of activity and range of motion. I started the patient on Lyrica 25 mg p.o. at bedtime.  He indicates doing rather well with this and he has gone up to twice daily.  He is wondering if we can increase it further.  I have given him the okay to do so. I started a trial of baclofen 10 mg p.o. 3 times  daily.  The patient was instructed to start taking it only at bedtime and move into the daytime, only if needed.  He has also done well with the baclofen and has increased it to twice daily as recommended.  I have informed him that he can go further up to 3 times daily if needed.  He indicates that he wanted the okay before doing that, but he agrees that he could benefit from increasing the dose to 3 times daily. I recommended that he stop taking the Ambien and we will provide him instead with melatonin 10 to 20 mg p.o. at bedtime.  He is still somewhat afraid to quit the Ambien, but I have recommended to him that he needs to do so and to rely on the melatonin more. I schedule him to return in 3 months to see how he is doing with these changes.  When he returns, he will be having his medication refills with [name], NP.  If he is doing well, then we will switch his schedule to every 6 months. I provided him with information about a gout diet. Today I have repeated the information that I gave him about the diet and now that he had some time to go over it I have answered some questions regarding it.  He was instructed to continue taking his gout medication as he has been doing.  His last uric acid level was 4.4, which I located under "Care Everywhere".   ROS  Constitutional:  Denies any fever or chills Gastrointestinal: No reported hemesis, hematochezia, vomiting, or acute GI distress Musculoskeletal: Denies any acute onset joint swelling, redness, loss of ROM, or weakness Neurological: No reported episodes of acute onset apraxia, aphasia, dysarthria, agnosia, amnesia, paralysis, loss of coordination, or loss of consciousness  Medication Review  Melatonin, allopurinol, amLODipine, baclofen, colchicine, losartan, pregabalin, and rosuvastatin  History Review  Allergy: Chris Johnson is allergic to atorvastatin and celexa [citalopram hydrobromide]. Drug: Chris Johnson  reports no history of drug  use. Alcohol:  reports current alcohol use of about 5.0 standard drinks of alcohol per week. Tobacco:  reports that he has never smoked. He has never used smokeless tobacco. Social: Chris Johnson  reports that he has never smoked. He has never used smokeless tobacco. He reports current alcohol use of about 5.0 standard drinks of alcohol per week. He reports that he does not use drugs. Medical:  has a past medical history of Arthritis, Gout (10/25/2017), Hyperlipidemia, Hypertension, and Normal cardiac stress test (1995). Surgical: Chris Johnson  has a past surgical history that includes Varicose vein surgery and colonoscopy. Family: family history includes Alcohol abuse in his mother; Arthritis in his father; Breast cancer in his sister; Heart attack in his mother; Hypertension in his mother; Prostate cancer in his father; Skin cancer in his mother; Stroke in his mother. Problem List: Mr. Matters has Chronic gouty arthropathy without tophi; Chronic lower extremity pain (Primary Area of Pain) (Bilateral) (L>R); Chronic ankle pain (Secondary Area of Pain) (Bilateral) (L>R); Chronic elbow pain (Tertiary Area of Pain) (Bilateral) (R>L); Chronic pain syndrome; Bilateral calf pain; Neurogenic pain; and Chronic musculoskeletal pain on their pertinent problem list.  Lab Review  Kidney Function Lab Results  Component Value Date   BUN 14 04/22/2018   CREATININE 1.22 04/22/2018   BCR 11 04/22/2018   GFRAA 71 04/22/2018   GFRNONAA 62 04/22/2018  Liver Function Lab Results  Component Value Date   AST 24 04/22/2018   ALT 12 12/14/2015   ALBUMIN 4.6 04/22/2018  Note: Above Lab results reviewed.  Imaging Review  US ARTERIAL LOWER EXTREMITY DUPLEX BILATERAL CLINICAL DATA:  68 year old male with a history of chronic pain  EXAM: NONINVASIVE PHYSIOLOGIC VASCULAR STUDY OF BILATERAL LOWER EXTREMITIES  TECHNIQUE: Evaluation of both lower extremities was performed at rest, including directed  duplex  COMPARISON:  None.  FINDINGS: Directed duplex of the right lower extremity demonstrates atherosclerotic changes. Triphasic waveform of the common femoral, profunda, SFA, popliteal artery, posterior tibial artery, anterior tibial artery.  Directed duplex of the left lower extremity demonstrates atherosclerotic changes, with triphasic waveform of the common femoral, profunda, popliteal artery, superficial femoral artery, anterior tibial artery, posterior tibial artery.  IMPRESSION: Directed duplex of the bilateral lower extremity demonstrates triphasic waveform throughout  Signed,  Dulcy Fanny. Dellia Nims, RPVI  [facility]  [facility]  Electronically Signed   By: Corrie Mckusick D.O.   On: 05/13/2018 20:02 Note: Above imaging results reviewed.        Physical Exam  General appearance: Well nourished, well developed, and well hydrated. In no apparent acute distress Mental status: Alert, oriented x 3 (person, place, & time)       Respiratory: No evidence of acute respiratory distress Eyes: PERLA Vitals: BP 138/87   Pulse 69   Temp 98 F (36.7 C)   Resp 18   Ht 5' 11.5" (1.816 m)   Wt 190 lb (86.2 kg)   SpO2 97%   BMI 26.13 kg/m  BMI: Estimated body mass index is 26.13 kg/m as calculated from the following:   Height as of this encounter: 5' 11.5" (1.816 m).   Weight as of this encounter: 190 lb (86.2 kg). Ideal: Ideal body weight: 76.5 kg (168 lb 8.7 oz) Adjusted ideal body weight: 80.3 kg (177 lb 2 oz)  Assessment   Status Diagnosis  Controlled Controlled Controlled 1. Chronic lower extremity pain (Primary Area of Pain) (Bilateral) (L>R)   2. Chronic ankle pain (Secondary Area of Pain) (Bilateral) (L>R)   3. Chronic elbow pain (Tertiary Area of Pain) (Bilateral) (R>L)   4. Bilateral calf pain   5. Chronic gouty arthropathy without tophi   6. Chronic pain syndrome   7. Chronic kidney disease (CKD), stage  III (moderate) (HCC)   8. Neurogenic pain   9. Chronic musculoskeletal pain   10. Insomnia, unspecified type      Updated Problems: Problem  Neurogenic Pain  Chronic Musculoskeletal Pain   Plan of Care  Pharmacotherapy (Medications Ordered): Meds ordered this encounter  Medications  . baclofen (LIORESAL) 10 MG tablet    Sig: Take 1 tablet (10 mg total) by mouth 3 (three) times daily.    Dispense:  270 tablet    Refill:  0    Do not place this medication, or any other prescription from our practice, on "Automatic Refill". Patient may have prescription filled one day early if pharmacy is closed on scheduled refill date.  . pregabalin (LYRICA) 25 MG capsule    Sig: Take 1 capsule (25 mg total) by mouth 3 (three) times daily.    Dispense:  270 capsule    Refill:  0  . Melatonin 10 MG CAPS    Sig: Take 20 mg by mouth at bedtime as needed.    Dispense:  60 capsule    Refill:  2    Do not add to the electronic "Automatic Refill" notification system. Patient may have prescription filled one day early if pharmacy is closed on scheduled refill date.   Administered today: [name] had no medications administered during this visit.  Orders:  No orders of the defined types were placed in this encounter.  Interventional options: Planned follow-up:   None at this time.  If he begins to experience any pain in the ankles, we may bring him in for a local anesthetic and steroid injection.  For now he is doing well and he does not need anything. Plan: Return for Med-Mgmt, w/ [name], NP.  Medication refills every 3 months.  We may continue to adjust Lyrica upwards, if needed.  If he is doing well with the current dose and no further adjustments are needed, then we will see him every 6 months.  Refills will then be done accordingly.   Considering:   Diagnostic intra-articular elbow injections  Diagnostic intra-articular ankle joint injections    Palliative PRN treatment(s):    None at this time   Note by: Gaspar Cola, MD Date: 05/20/2018; Time: 9:50 AM

## 2018-05-20 NOTE — Patient Instructions (Signed)
____________________________________________________________________________________________  Gout    Mechanism: Uric acid accumulation.    Uric Acid: Uric acid is a heterocyclic compound of carbon, nitrogen, oxygen, and hydrogen with the formula [id number]. It forms ions and salts known as urates and acid urates such as ammonium acid urate. Uric acid is a product of the metabolic breakdown of purine nucleotides. High blood concentrations of uric acid can lead to gout. The chemical is associated with other medical conditions including diabetes and the formation of ammonium acid urate kidney stones.    Purines: Purines are found in high concentration in meat and meat products, especially internal organs such as liver and kidney. In general, plant-based diets are low in purines. Examples of high-purine sources include: sweetbreads, anchovies, sardines, liver, beef kidneys, brains, meat extracts (e.g., Oxo, Bovril), herring, mackerel, scallops, game meats, beer (from the yeast) and gravy. A moderate amount of purine is also contained in beef, pork, poultry, other fish and seafood, asparagus, cauliflower, spinach, mushrooms, green peas, lentils, dried peas, beans, oatmeal, wheat bran, wheat germ, and hawthorn. Higher levels of meat and seafood consumption are associated with an increased risk of gout, whereas a higher level of consumption of dairy products is associated with a decreased risk. Moderate intake of purine-rich vegetables or protein is not associated with an increased risk of gout.    Causes: Uric acid is generated as the body's tissues are broken down during normal cell turnover. Some people with gout generate too much uric acid (10%). Other patients with gout do not effectively eliminate their uric acid into the urine (90%). Genetics, gender,and nutrition (alcoholism, obesity) play key roles in the development of gout.   1. If your parents have gout, then you have a 20% chance of developing it.    2. British people are 5 times more likely to develop gout. 3. American blacks, but not African blacks, are more likely to have gout than other populations. 4. Use of alcohol, especially beer, increases the risk for gout.   5. Diets rich in red meats, internal organs, yeast, and oily fish increase the risk for gout.   6. Uric acid levels increase at puberty in men and at menopause in women, so men first develop gout at an earlier age (30s to 50s) than do women (50s to 70s). Gout in pre-menopausal women is distinctly unusual.   7. Attacks of gouty arthritis?can be precipitated when there is a sudden change in uric acid levels.   8. Overindulgence of alcohol and red meats   9. Trauma   10. Starvation and dehydration 11. IV contrast dyes   12. Chemotherapy     Some Possible Causes of Elevated Uric Acid Levels  Medication Diuretics used for weight loss or heart disease, insulin, some antibiotics, medication for rheumatoid arthritis, or an overdose of B vitamins can cause uric acid levels to rise. Diuretics reduce sodium, magnesium, calcium and potassium (among other things) levels. If you need to use a diuretic, see our natural herbal products for ones with fewer side effects. One customer reported getting gout when he took beta-blockers for his high blood pressure.     Poor kidney function When kidneys are not functioning at optimum levels, they lose their ability to excrete uric acid from the body. This situation may be due to various kidney problems or over-consumption of alcohol. When alcohol is metabolized, lactic acid is produced, which hinders uric acid excretion by the kidneys.     Dieting Severe dieting or fasting can cause excess lactic   acid, which hinders uric acid excretion by the kidneys. Crash and severe calorie restriction diets shock your metabolism and can trigger a gout attack. Dieting may also cause a loss of potassium, which can increase urate levels in the blood. As mentioned  above, some dieters also use diuretics to speed the process, and they can rob the body of potassium and other minerals, triggering a gout attack. It seems to be a vicious circle! However, a proper diet that is done slowly is recommended because losing weight will reduce serum levels of uric acid.    Diet Traditional thinking tells us that gout is the result of excessive amounts of alcohol, protein, heavy foods, coffee and soft drinks in your diet. Certain foods contain high levels of purine which can cause uric acid levels to rise. Purine is a protein substance that is transformed into uric acid during digestion. Reduction in consumption of these foods is very often successful in reducing or eliminating gout.     A potassium deficiency can increase urate levels in the blood. This is very important, and ways to correct it?are discussed above and under the diuretics section.    Drugs that increase serum uric acid  1. Aspirin (Low dose)  2. Diuretics  3. hypertensive medications   4. Nicotinic acid   5. Cyclosporine A   6. Acetaminophen (Tylenol)  7. Others     Pharmacological treatment:  1) Colchicine (PO)  Adverse Side Effects: nausea, vomiting, diarrhea  MAX: 6 mg/day during an acute attack  Goal: keep serum urate < 7.0 mg/dl Prophylactic Dose: 0.6 -1.2 mg/day  2) Probenecid (Uricosuric properties)  Mechanism of Action: increases uric acid excretion  Adverse Side Effects: overt nephrolithiasis (Kidney stones). Side effects of probenecid are uncommon and usually mild. In addition to causing kidney stones and precipitating acute gouty arthritis, side effects of probenecid include hair loss, skin rash, headache, nausea, sore gums, and fever. In rare instances, it has caused severe anemias  To Avoid Stones:  start at low doses  Stay well hydrated  Alkalinize urine  1) Sodium Bicarbonate  2) And/or Acetazolamide (Carbonic anhydrate inhibitor) Starting Dose: 250 mg bid and increase over  several weeks  3) Allopurinol  Mechanism of Action: Decreases uric acid production  Adverse Side Effects: fever, dermatitis, elevated liver enzymes, diarrhea, and vasculitis. The most frequent adverse reaction to allopurinol is skin rash. Allopurinol should be discontinued immediately at the first appearance of rash, painful urination, blood in the urine, eye irritation, or swelling of the mouth or lips, because these can be a signs of impending severe allergic reaction, which can be fatal. Rarely, allopurinol can cause nerve, kidney, and bone marrow damage.  Dose: 300 mg/day   Treatment  1. Drink 2 to 3 L of fluid daily.  2. Consume a moderate amount of protein. Limit meat, fish and poultry to 4 - 6 oz per day. Try other low-purine good protein foods such as low fat dairy products, tofu and eggs.  3. Limit fat intake by choosing leaner meats, foods prepared with less oils and lower fat dairy products  4. Aside from avoiding high purine foods, maintaining a healthy body weight is important for gout patients as well. Obesity can result in increased uric acid production by the body. Follow a well-balanced diet to lose excess body weight. Do not follow a high-protein low-carb diet as this can worsen gout conditions.  5. Keep the urine pH high (basic or non-acidic)  6. Colchicine, probenecid, allopurinol, sodium bicarbonate.      Prevention  If you are at risk for gout, you should   1. Eat a low-cholesterol, low-fat diet. People with gout have a higher risk for heart disease. This diet would not only lower your risk for gout but also your risk for heart disease.   2. Slowly lose weight. This can lower your uric acid levels. Losing weight too rapidly can occasionally precipitate gout attacks.   3. Restrict your?intake of alcohol, especially beer.   If you have had an attack of gouty arthritis, you should do all of the above and follow the regimen prescribed by your physician. The adequate prevention of  gouty arthritis may involve lifelong medical therapy.    Balanced Diet  According to the American Medical Association, a balanced diet for people with gout include foods:  1. High in complex carbohydrates (whole grains, fruits, vegetables)   2. Low in protein (15% of calories and sources should be soy, lean meats, poultry)   3. No more than 30% of calories from fat (10% animal fat)    Beneficial Foods  Foods which may be beneficial to people with gout include:  1. Dark berries may contain chemicals that lower uric acid and reduce inflammation.   2. Tofu which is made from soybeans may be a better choice than meats.   3. Certain fatty acids found in certain fish such as salmon, flax or olive oil, or nuts may possess some anti-inflammatory benefits.    Recommended Foods to Eat  1. Fresh cherries, strawberries, blueberries, and other red-blue berries   2. Bananas   3. Celery   4. Tomatoes   5. Vegetables including kale, cabbage, parsley, green-leafy vegetables   6. Foods high in bromelain (pineapple)   7. Foods high in vitamin C (red cabbage, red bell peppers, tangerines, mandarins, oranges, potatoes)   8. Drink fruit juices and purified water (8 glasses of water per day)   9. Low-fat dairy products   10. Complex carbohydrates (breads, cereals, pasta, rice, as well as aforementioned vegetables and fruits)   11. Chocolate, cocoa   12. Coffee, tea   13. Carbonated beverages   14. Essential fatty acids (tuna and salmon, flaxseed, nuts, seeds)   15. Tofu, although a legume and made from soybeans, may be a better choice than meat    Foods to Avoid  Diets which are high in purines and high in protein have long been suspected of causing an increased risk of gout .    According to the American Medical Association, purine-containing foods include:  1. Beer, other alcoholic beverages. Limit alcohol consumption to 1 drink 3 times a week.  2. Anchovies, sardines in oil, fish roes, herring,  Mackerel, Scallops, mussels  3. Yeast. (Beer), whole grain breads and cereals, oatmeal  4. Organ meat (liver, kidneys, brains, sweetbreads)   5. Processed meats (hot dogs, lunch meats, etc.),  6. Legumes (dried beans, peas, lima beans)   7. Meat extracts, consomm, broth, bouillon, gravies. (e.g Oxo, Bovril)  8. Mushrooms, spinach, asparagus, cauliflower, mushrooms.  9. Chicken, duck, ham, turkey, Game meats   10. Fried foods, roasted nuts, any food cooked in oil (heated oil destroys vitamin E)  11. Rich foods (cakes, sugar products, white flour products)  12. Dried fruits  13. Caffeine  14. Eggs    NOTE: It is important to remember that purines are found in all protein foods. All sources of purines should not be eliminated.    Urine at pH 7.0 is neutral and elimination of   uric acid decreases by approximately 50% at pH 6.5. The pka of uric acid is 5.75.  In urine at pH 5.0, only 15% of uric acid exists in solution. The solubility increases more than 10-fold at pH 7.0 and more than 100-fold at pH 8.0.    Extremely Alkaline Forming Foods - pH 8.5 to 9.0:  Lemons, Watermelon , Agar Agar , Cantaloupe, Cayenne (Capsicum), Dried dates & figs, Kelp, Karengo, Kudzu root, Limes, Mango, Melons, Papaya, Parsley, Seedless grapes (sweet), Watercress, Seaweed    Moderate Alkaline - pH 7.5 to 8.0  Apples (sweet), Apricots, Alfalfa sprouts Arrowroot, Avocados, Bananas (ripe), Berries, Carrots, Celery, Currants, Dates & figs (fresh), Garlic , Gooseberry, Grapes (less sweet), Grapefruit, Guavas, Herbs (leafy green), Lettuce (leafy green), Nectarine, Peaches (sweet), Pears (less sweet), Peas (fresh sweet), Persimmon, Pumpkin (sweet), Sea salt , Spinach, Apples (sour), Bamboo shoots, Beans (fresh green), Beets, Bell Pepper, Broccoli, Cabbage, Cauliflower, Carob , Daikon, Ginger (fresh), Grapes (sour), Kale, Kohlrabi, Lettuce (pale green), Oranges, Parsnip, Peaches (less sweet), Peas (less sweet), Potatoes & skin,  Pumpkin (less sweet), Raspberry, Sapote, Strawberry, Squash , Sweet corn (fresh), Tamari , Turnip, Sour Dairy    Slightly Alkaline to Neutral pH 7.0  Almonds , Artichokes ([place]), Barley-Malt (sweetener-[place]), Brown Rice Syrup, Brussel Sprouts, Cherries, Coconut (fresh), Cucumbers, Egg plant, Honey (raw), Leeks, Miso, Okra, Olives ripe , Onions, Pickles (home made with brown rice vinegar), Radish, Sea salt , Spices , Taro, Tomatoes (sweet), Vinegar (sweet brown rice), Water Chestnut, Amaranth, Artichoke (globe), Chestnuts (dry roasted), Egg yolks (soft cooked), Goat's milk and whey (raw) , Horseradish, Mayonnaise (home made), Millet, Olive oil, Quinoa, Rhubarb, Sesame seeds (whole) , Soy beans (dry), Sprouted grains , Tempeh, Tofu, Tomatoes (less sweet)    Slightly Acid to Neutral pH 7.0  Barley malt syrup, Barley, Bran, Cashews, Cereals (unrefined with honey-fruit-maple syrup), Cornmeal, Fructose, Honey (pasteurized), Lentils, Macadamias, Maple syrup (unprocessed),Low Fat Milk (homogenized) and most processed dairy products, Molasses organic , Nutmeg, Mustard, Pistachios, Popcorn (plain), Rice or wheat crackers (unrefined), Rye (grain), Rye bread (organic sprouted), Seeds (pumpkin & sunflower), Walnuts Blueberries, Brazil nuts, Butter (salted), Cheeses (mild & crumbly) , Crackers (unrefined rye), Dried beans (mung, adzuki, pinto, kidney, garbanzo) , Dry coconut, Egg whites, Goats milk (homogenized), Olives (pickled), Pecans, Plums , Prunes , Butter (fresh unsalted), Cream (fresh & raw), Milk (raw cow's) , Whey (cow's)     ACID FORMING FOODS FATS & OILS  Avocado Oil, Canola Oil, Corn Oil, Hemp Seed Oil, Flax Oil, Lard, Olive Oil, Safflower Oil, Sesame Oil, Sunflower Oil    FRUITS  Cranberries    GRAINS  Rice Cakes, Wheat Cakes, Amaranth, Barley, Buckwheat, Oats (rolled), Quinoa, Rice, Rye, Spelt, Kamut, Wheat, Hemp Seed Flour    NUTS & BUTTERS  Cashews, Brazil Nuts, Peanuts, Processed Peanut  Butter, Pecans, Tahini    ANIMAL PROTEIN  Beef, Carp, Clams, Fish, Lamb, Lobster, Mussels, Oyster, Pork, Rabbit, Salmon, Shrimp, Scallops, Tuna, Turkey, Venison    PASTA (WHITE)  Noodles, Macaroni, Spaghetti Distilled Vinegar, Wheat Germ    BEANS & LEGUMES  Black Beans, Chick Peas, Green Peas, Kidney Beans, Lentils, [place] Beans, Pinto Beans, Red Beans, Soy Beans, Soy Milk, White Beans, Rice Milk, Almond Milk    DRUGS & CHEMICALS  Aspartame, Chemicals, Drugs (Medicinal), Drugs (Psychedelic), Pesticides, Herbicides    ALCOHOL  Beer, Spirits, Hard Liquor, Wine    ACTIVITIES  Overwork, Anger, Fear, Jealousy, Stress    Moderate Acid - pH 6.0 to 6.5  Cigarette tobacco, Cream of Wheat (  unrefined), Fish, Fruit juices with sugar, Maple syrup (processed), Molasses (sulphured), Pickles (commercial), Breads (refined) of corn, oats, rice & rye, Cereals (refined), corn flakes, Shellfish, Wheat germ, Whole Wheat foods , Wine , Yogurt (sweetened) Bananas (green), Buckwheat, Cheeses (sharp), Corn & rice breads, Egg whole (cooked hard), Ketchup, Mayonnaise, Oats, Pasta (whole grain), Pastry (wholegrain & honey), Peanuts, Potatoes (with no skins), Popcorn (with salt & butter), Rice (basmati), Rice (brown), Soy sauce (commercial), Tapioca, Wheat bread (sprouted organic)    Extremely Acid Forming Foods - pH 5.0 to 5.5  Artificial sweeteners, Beef, Carbonated soft drinks & fizzy drinks , Cigarettes (tailor made), Drugs, Flour (white wheat), Goat, Lamb, Pastries & cakes from white flour, Pork, Sugar (white) , Beer , Brown sugar , Chicken, [place], Chocolate, Coffee , Custard with white sugar, Jams, Jellies, Liquor , Pasta (white), Rabbit, [place], Table salt refined & iodized, Tea black, Turkey, Wheat bread, White rice, White vinegar (processed).    Research Update:   A recent study published in the [employer] Journal of Medicine on Jun 05, 2002 revealed that high intake of low-fat dairy products indeed reduces  the risk of gout by 50%. It is unknown why low-fat dairy products offer a protective effect.   Unfortunately, no natural supplements are proven effective to prevent or alleviate onset of acute gout attacks. The most effective treatment for gout attack is medication.   ____________________________________________________________________________________________   

## 2018-05-28 ENCOUNTER — Telehealth: Payer: Self-pay | Admitting: *Deleted

## 2018-05-28 NOTE — Telephone Encounter (Signed)
Attempted to call patient back several times with no answer. Left voicemail instructing patient that he could fill his Lyrica script 05/30/2018. If he has any questions to call us for clarification.

## 2018-07-12 ENCOUNTER — Telehealth: Payer: Self-pay | Admitting: Pain Medicine

## 2018-07-12 NOTE — Telephone Encounter (Signed)
Patient called stating he has been prescribed baclofen and lyrica and is on a titration dosage. As the meds go up he is having very swollen and puffy legs and feet, it has gotten very uncomfortable. Please call patient to discuss this and solutions.

## 2018-07-12 NOTE — Telephone Encounter (Signed)
1.Spoke with patient and he is c/o of peripheral edema in both legs and feet up to the knees.  States that it is getting worse and worse and has become very painful.  Reports that he had been prescribed oxycodone 5 mg qty 20 back in December 2019 by a different provider for gout and that he has been taking 1 at night to help the pain and so he can sleep.    Called Dr Dossie Arbour to discuss patient issue.  Advised that I should first check with patient to see if there is history of kidney disease or CHF.  That if so, that the swelling could be coming from that and not the medication.  If no history then advise patient to stop one of the medications x 4 days and swelling does not improve, then resume and stop the other medicine x 4 days.  Dr Dossie Arbour does not wish to prescribe any pain medication at this time.  Patient will require a phone visit to reassess and discuss if this would be a valid option for him.   Called back to patient to relay information.  Patient verbalizes u/o information. Denies any history of kidney or heart problems.  States that if he is able to get the swelling under control perhaps he would not need the pain medication.  Patient to call if s/s do not improve or worsen and then also to get back on schedule if he feels he would like to be considered for opioid therapy.

## 2018-08-14 ENCOUNTER — Telehealth: Payer: Self-pay | Admitting: Nurse Practitioner

## 2018-08-14 ENCOUNTER — Ambulatory Visit: Payer: Managed Care, Other (non HMO) | Attending: Nurse Practitioner | Admitting: Nurse Practitioner

## 2018-08-14 ENCOUNTER — Other Ambulatory Visit: Payer: Self-pay

## 2018-08-14 DIAGNOSIS — M79604 Pain in right leg: Secondary | ICD-10-CM | POA: Diagnosis not present

## 2018-08-14 DIAGNOSIS — M25572 Pain in left ankle and joints of left foot: Secondary | ICD-10-CM

## 2018-08-14 DIAGNOSIS — M79605 Pain in left leg: Secondary | ICD-10-CM

## 2018-08-14 DIAGNOSIS — M79661 Pain in right lower leg: Secondary | ICD-10-CM

## 2018-08-14 DIAGNOSIS — M25571 Pain in right ankle and joints of right foot: Secondary | ICD-10-CM | POA: Diagnosis not present

## 2018-08-14 DIAGNOSIS — M1A00X0 Idiopathic chronic gout, unspecified site, without tophus (tophi): Secondary | ICD-10-CM | POA: Diagnosis not present

## 2018-08-14 DIAGNOSIS — G8929 Other chronic pain: Secondary | ICD-10-CM

## 2018-08-14 DIAGNOSIS — M79662 Pain in left lower leg: Secondary | ICD-10-CM

## 2018-08-14 NOTE — Telephone Encounter (Signed)
Sent!

## 2018-08-14 NOTE — Progress Notes (Signed)
Pain Management Encounter Note - Virtual Visit via Telephone Telehealth (real-time audio visits between healthcare provider and patient).  Patient's Phone No. & Preferred Pharmacy:  [phone] (home); [phone] (mobile); (Preferred) [phone]  [place], [place] [address] Phone: [phone] Fax: [phone]   Pre-screening note:  Our staff contacted Chris Johnson and offered him an "in person", "face-to-face" appointment versus a telephone encounter. He indicated preferring the telephone encounter, at this time.  Reason for Virtual Visit: COVID-19*  Social distancing based on CDC and AMA recommendations.   I contacted Chris Johnson on 08/14/2018 at 8:45 AM by telephone and clearly identified myself as Chris David, NP. I verified that I was speaking with the correct person using two identifiers (Name and date of birth: July 07, 1950).  Advanced Informed Consent I sought verbal advanced consent from Chris Johnson for telemedicine interactions and virtual visit. I informed Chris Johnson of the security and privacy concerns, risks, and limitations associated with performing an evaluation and management service by telephone. I also informed Chris Johnson of the availability of "in person" appointments and I informed him of the possibility of a patient responsible charge related to this service. Chris Johnson expressed understanding and agreed to proceed.   Historic Elements   Chris Johnson is a 68 y.o. year old, male patient evaluated today after his last encounter by our practice on 07/12/2018. Chris Johnson  has a past medical history of Arthritis, Gout (10/25/2017), Hyperlipidemia, Hypertension, and Normal cardiac stress test (1995). He also  has a past surgical history that includes Varicose vein surgery and colonoscopy. Chris Johnson has a current medication list which includes  the following prescription(s): allopurinol, amlodipine, baclofen, colchicine, losartan, melatonin, pregabalin, and rosuvastatin. He  reports that he has never smoked. He has never used smokeless tobacco. He reports current alcohol use of about 5.0 standard drinks of alcohol per week. He reports that he does not use drugs. Chris Johnson is allergic to atorvastatin and celexa [citalopram hydrobromide].   HPI  I last saw him on 04/22/2018. He is being evaluated for medication management. He is having 5/10 leg pain. The pain is from the knees into his ankles. He denies numbness and tingling but mostly pain. He was started on Lyrica. He did stop this about 3 weeks ago because of increased swelling that caused more pain. He admits that he swelling resolved after 3 days of completely stopping the Lyrica. He has also completed a NCS/EMG with Dr Manuella Ghazi. He was encouraged to add folate to his diet. He has has failled Gabapentin in the past. He denies the use of Cymbalta. He admits that Oxycodone/APAP 5/325mg  1 tab qhs was effective with assisting with is pain at night. He feels like the he does ok during the day but by 6pm his pain as increased to 8/10. He had a Rx of Oxycodone/APAP 20 tabs that lasted about 6 mons. This is what he has used the last few weeks.   Pharmacotherapy Assessment  Analgesic: None MME/day: 0 mg/day.   Monitoring: Pharmacotherapy: No side-effects or adverse reactions reported. [place] PMP: PDMP reviewed during this encounter.       Compliance: No problems identified. Plan: Refer to "POC".  Review of recent tests  US ARTERIAL LOWER EXTREMITY DUPLEX BILATERAL CLINICAL DATA:  68 year old male with a history of chronic pain  EXAM: NONINVASIVE PHYSIOLOGIC VASCULAR STUDY OF BILATERAL LOWER EXTREMITIES  TECHNIQUE:  Evaluation of both lower extremities was performed at rest, including directed duplex  COMPARISON:  None.  FINDINGS: Directed duplex of the right lower extremity  demonstrates atherosclerotic changes. Triphasic waveform of the common femoral, profunda, SFA, popliteal artery, posterior tibial artery, anterior tibial artery.  Directed duplex of the left lower extremity demonstrates atherosclerotic changes, with triphasic waveform of the common femoral, profunda, popliteal artery, superficial femoral artery, anterior tibial artery, posterior tibial artery.  IMPRESSION: Directed duplex of the bilateral lower extremity demonstrates triphasic waveform throughout  Signed,  Dulcy Fanny. Dellia Nims, RPVI  [facility]  [facility]  Electronically Signed   By: Corrie Mckusick D.O.   On: 05/13/2018 20:02   Office Visit on 05/09/2018  Component Date Value Ref Range Status  . Specific Gravity, UA 05/09/2018 >1.030* 1.005 - 1.030 Final  . pH, UA 05/09/2018 6.5  5.0 - 7.5 Final  . Color, UA 05/09/2018 Yellow  Yellow Final  . Appearance Ur 05/09/2018 Clear  Clear Final  . Leukocytes, UA 05/09/2018 Negative  Negative Final  . Protein, UA 05/09/2018 3+* Negative/Trace Final  . Glucose, UA 05/09/2018 Negative  Negative Final  . Ketones, UA 05/09/2018 Trace* Negative Final  . RBC, UA 05/09/2018 Negative  Negative Final  . Bilirubin, UA 05/09/2018 Negative  Negative Final  . Urobilinogen, Ur 05/09/2018 0.2  0.2 - 1.0 mg/dL Final  . Nitrite, UA 05/09/2018 Negative  Negative Final   Assessment  The primary encounter diagnosis was Chronic lower extremity pain (Primary Area of Pain) (Bilateral) (L>R). Diagnoses of Chronic ankle pain (Secondary Area of Pain) (Bilateral) (L>R), Bilateral calf pain, and Chronic gouty arthropathy without tophi were also pertinent to this visit.  Plan of Care  I am having Chris Johnson maintain his amLODipine, losartan, rosuvastatin, allopurinol, colchicine, baclofen, pregabalin, and Melatonin.  Pharmacotherapy (Medications Ordered): No orders of the defined types were placed in this  encounter.  Orders:  No orders of the defined types were placed in this encounter.  Follow-up plan:   Return in about 4 weeks (around 09/11/2018) for MedMgmt. Will collaborate with Dr Lowella Dandy for further treatment plan.   I discussed the assessment and treatment plan with the patient. The patient was provided an opportunity to ask questions and all were answered. The patient agreed with the plan and demonstrated an understanding of the instructions.  Patient advised to call back or seek an in-person evaluation if the symptoms or condition worsens.  Total duration of non-face-to-face encounter: 15 minutes.  Note by: Chris David, NP Date: 08/14/2018; Time: 10:53 AM  Disclaimer:  * Given the special circumstances of the COVID-19 pandemic, the federal government has announced that the Office for Civil Rights (OCR) will exercise its enforcement discretion and will not impose penalties on physicians using telehealth in the event of noncompliance with regulatory requirements under the [place] (HIPAA) in connection with the good faith provision of telehealth during the RJJOA-41 national public health emergency. ([place])

## 2018-08-14 NOTE — Patient Instructions (Signed)

## 2018-08-21 ENCOUNTER — Other Ambulatory Visit: Payer: Self-pay | Admitting: Pain Medicine

## 2018-08-21 NOTE — Telephone Encounter (Signed)
On the average, when you encounter a situation similar to this one, go ahead and schedule the patient for a virtual visit. On the schedule notes enter: "V-visit: Med-Mgmt".

## 2018-08-26 ENCOUNTER — Other Ambulatory Visit: Payer: Self-pay | Admitting: Pain Medicine

## 2018-08-26 DIAGNOSIS — M79661 Pain in right lower leg: Secondary | ICD-10-CM

## 2018-08-26 DIAGNOSIS — M79662 Pain in left lower leg: Secondary | ICD-10-CM

## 2018-09-02 ENCOUNTER — Other Ambulatory Visit: Payer: Self-pay | Admitting: Cardiovascular Disease

## 2018-09-02 ENCOUNTER — Telehealth: Payer: Self-pay | Admitting: Cardiovascular Disease

## 2018-09-02 MED ORDER — LOSARTAN POTASSIUM 100 MG PO TABS: 100.0000 mg | ORAL_TABLET | Freq: Every day | ORAL | 3 refills | Status: DC

## 2018-09-02 NOTE — Telephone Encounter (Signed)
Mr. Shropshire reports having lower extremity edema/swelling on Lyrica Stop the Lyrica and symptoms of swelling have resolved He reports he is taking losartan 50 mg daily with amlodipine 10 mg daily We did discuss that amlodipine could cause lower extremity edema and perhaps in combination with Lyrica this could make his symptoms worse  Suggested he try to decrease or stop the amlodipine Increase the losartan up to 100 mg daily Monitor blood pressure closely If blood pressure runs high may need half dose amlodipine ,5 mg daily Or could take very low-dose HCTZ 12.5 mg daily  He will call us back with blood pressure numbers  He continues to have severe bilateral muscle pain in his legs Etiology unclear Followed by neurology, trying different medications including Lyrica Has also tried muscle relaxation pills/baclofen, NSAIDs  Also having terrible problems with gout On allopurinol, colchicine  Signed, Esmond Plants, MD, Ph.D [facility]

## 2018-09-05 ENCOUNTER — Other Ambulatory Visit: Payer: Self-pay | Admitting: Cardiovascular Disease

## 2018-09-05 NOTE — Telephone Encounter (Signed)
Please advise if ok to refill Amlodipine 10 mg tablet qd. I spoke to pt this am and he confirmed correct dose and instructions. Amlodipine 10 mg tablet qd. Pt would like 90 day Rx.

## 2018-09-05 NOTE — Telephone Encounter (Signed)
Called patient to discuss. Patient has not been seen in office since 2014 by [facility]. He is agreeable to Virtural Visit with Dr Rockey Situ. Rx for one month of Amlodipine sent to pharmacy. MyChart consent pending to patient. He was appreciative.

## 2018-09-05 NOTE — Telephone Encounter (Signed)
Patient returning call.

## 2018-09-05 NOTE — Telephone Encounter (Signed)
Lmovm to verify how pt is taking amlodipine10 mg tablet.

## 2018-09-08 NOTE — Progress Notes (Signed)
Virtual Visit via Video Note   This visit type was conducted due to national recommendations for restrictions regarding the COVID-19 Pandemic (e.g. social distancing) in an effort to limit this patient's exposure and mitigate transmission in our community.  Due to his co-morbid illnesses, this patient is at least at moderate risk for complications without adequate follow up.  This format is felt to be most appropriate for this patient at this time.  All issues noted in this document were discussed and addressed.  A limited physical exam was performed with this format.  Please refer to the patient's chart for his consent to telehealth for [facility].   I connected with  Chris Johnson on 09/09/18 by a video enabled telemedicine application and verified that I am speaking with the correct person using two identifiers. I discussed the limitations of evaluation and management by telemedicine. The patient expressed understanding and agreed to proceed.   Evaluation Performed:  Follow-up visit  Date:  09/09/2018   ID:  Chris Johnson, DOB 1950-03-30, MRN [id number]  Patient Location:  [place] [address]   Provider location:   [facility], [place] office  PCP:  [facility] [place]  Cardiologist:  Arvid Right [facility]   Chief Complaint:   Leg pain   History of Present Illness:    Chris Johnson is a 68 y.o. male who presents via audio/video conferencing for a telehealth visit today.   The patient does not symptoms concerning for COVID-19 infection (fever, chills, cough, or new SHORTNESS OF BREATH).   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 September 2019 Chronic leg pain Chronic renal dysfunction Who presents to establish care in the [place] office for his hypertension   On losartan 100 daily Also taking amlodipine 10 mg daily Concerned about prior leg swelling when he  was on Lyrica  " Legs blew up" on Lyrica, cause severe pain  Off crestor, too many pills leg pain is chronic  Would like to go back on losartan HCT  Lab work reviewed, mild prerenal state approximately 4 years ago normal since that time  Active at baseline no symptoms of chest pain or shortness of breath  Imaging reviewed showing mild carotid disease, coronary calcium score 170  Previous total cholesterol 238 several years ago  Prior CV studies:   The following studies were reviewed today:    Past Medical History:  Diagnosis Date  . Arthritis   . Gout 10/25/2017  . Hyperlipidemia   . Hypertension   . Normal cardiac stress test 1995   cardiolyte   Past Surgical History:  Procedure Laterality Date  . colonoscopy    . VARICOSE VEIN SURGERY       Current Meds  Medication Sig  . allopurinol (ZYLOPRIM) 100 MG tablet Take 200 mg by mouth daily.  Marland Kitchen amLODipine (NORVASC) 10 MG tablet Take 1 tablet (10 mg total) by mouth daily. Please keep upcoming appointment for further refills.  . colchicine 0.6 MG tablet Take 0.6 mg by mouth 2 (two) times daily.  Marland Kitchen losartan (COZAAR) 100 MG tablet Take 1 tablet (100 mg total) by mouth daily.  . rosuvastatin (CRESTOR) 20 MG tablet Take 1 tablet (20 mg total) by mouth daily.     Allergies:   Atorvastatin and Celexa [citalopram hydrobromide]   Social History   Tobacco Use  . Smoking status: Never Smoker  . Smokeless tobacco: Never Used  Substance Use  Topics  . Alcohol use: Yes    Alcohol/week: 5.0 standard drinks    Types: 5 Standard drinks or equivalent per week    Comment: Occasional  . Drug use: No     Current Outpatient Medications on File Prior to Visit  Medication Sig Dispense Refill  . allopurinol (ZYLOPRIM) 100 MG tablet Take 200 mg by mouth daily.    Marland Kitchen amLODipine (NORVASC) 10 MG tablet Take 1 tablet (10 mg total) by mouth daily. Please keep upcoming appointment for further refills. 30 tablet 0  . colchicine 0.6 MG tablet  Take 0.6 mg by mouth 2 (two) times daily.    Marland Kitchen losartan (COZAAR) 100 MG tablet Take 1 tablet (100 mg total) by mouth daily. 90 tablet 3  . rosuvastatin (CRESTOR) 20 MG tablet Take 1 tablet (20 mg total) by mouth daily. 90 tablet 3  . baclofen (LIORESAL) 10 MG tablet Take 1 tablet (10 mg total) by mouth 3 (three) times daily. 270 tablet 0  . Melatonin 10 MG CAPS Take 20 mg by mouth at bedtime as needed. 60 capsule 2  . pregabalin (LYRICA) 25 MG capsule Take 1 capsule (25 mg total) by mouth 3 (three) times daily. 270 capsule 0   No current facility-administered medications on file prior to visit.      Family Hx: The patient's family history includes Alcohol abuse in his mother; Arthritis in his father; Breast cancer in his sister; Heart attack in his mother; Hypertension in his mother; Prostate cancer in his father; Skin cancer in his mother; Stroke in his mother.  ROS:   Please see the history of present illness.    Review of Systems  Constitutional: Negative.   Respiratory: Negative.   Cardiovascular: Negative.   Gastrointestinal: Negative.   Musculoskeletal: Negative.   Neurological: Negative.   Psychiatric/Behavioral: Negative.   All other systems reviewed and are negative.    Labs/Other Tests and Data Reviewed:    Recent Labs: 04/22/2018: BUN 14; Creatinine, Ser 1.22; Magnesium 2.2; Potassium 4.0; Sodium 142   Recent Lipid Panel Lab Results  Component Value Date/Time   CHOL 238 (H) 12/14/2015 02:59 PM   TRIG 288.0 (H) 12/14/2015 02:59 PM   HDL 38.80 (L) 12/14/2015 02:59 PM   CHOLHDL 6 12/14/2015 02:59 PM   LDLCALC 128 (H) 06/23/2013 09:27 AM   LDLDIRECT 144.0 12/14/2015 02:59 PM    Wt Readings from Last 3 Encounters:  05/20/18 190 lb (86.2 kg)  05/09/18 191 lb (86.6 kg)  05/06/18 191 lb (86.6 kg)     Exam:    Vital Signs: Vital signs may also be detailed in the HPI There were no vitals taken for this visit.  Wt Readings from Last 3 Encounters:  05/20/18 190 lb  (86.2 kg)  05/09/18 191 lb (86.6 kg)  05/06/18 191 lb (86.6 kg)   Temp Readings from Last 3 Encounters:  05/20/18 98 F (36.7 C)  05/06/18 98 F (36.7 C) (Oral)  04/22/18 98.2 F (36.8 C)   BP Readings from Last 3 Encounters:  05/20/18 138/87  05/09/18 130/80  05/06/18 121/75   Pulse Readings from Last 3 Encounters:  05/20/18 69  05/09/18 80  05/06/18 76    126/82, 70 Weight 180  Well nourished, well developed male in no acute distress. Constitutional:  oriented to person, place, and time. No distress.     ASSESSMENT & PLAN:    Essential hypertension -  He would like to go back on losartan HCT We will restart 100/25  mg daily and wean off the amlodipine at his request  Renal insufficiency Most recent lab work numbers past 3 years have shown normal GFR  Chronic pain syndrome - Leg pain, unclear etiology followed by pain clinic  Mixed hyperlipidemia -  Will start zetia 10 mg daily We will avoid a statin given his chronic leg pain  COVID-19 Education: The signs and symptoms of COVID-19 were discussed with the patient and how to seek care for testing (follow up with PCP or arrange E-visit).  The importance of social distancing was discussed today.  Patient Risk:   After full review of this patients clinical status, I feel that they are at least moderate risk at this time.  Time:   Today, I have spent 25 minutes with the patient with telehealth technology discussing the cardiac and medical problems/diagnoses detailed above   10 min spent reviewing the chart prior to patient visit today   Medication Adjustments/Labs and Tests Ordered: Current medicines are reviewed at length with the patient today.  Concerns regarding medicines are outlined above.   Tests Ordered: No tests ordered   Medication Changes: No changes made   Disposition: Follow-up in 12 months He will call us with blood pressure measurements in the next few weeks  Signed, Ida Rogue, MD   09/09/2018 9:16 AM    [facility] [address] [place] [address], [place], [place] [address]

## 2018-09-09 ENCOUNTER — Other Ambulatory Visit: Payer: Self-pay

## 2018-09-09 ENCOUNTER — Telehealth (INDEPENDENT_AMBULATORY_CARE_PROVIDER_SITE_OTHER): Payer: Managed Care, Other (non HMO) | Admitting: Cardiovascular Disease

## 2018-09-09 DIAGNOSIS — I2510 Atherosclerotic heart disease of native coronary artery without angina pectoris: Secondary | ICD-10-CM | POA: Diagnosis not present

## 2018-09-09 DIAGNOSIS — G894 Chronic pain syndrome: Secondary | ICD-10-CM

## 2018-09-09 DIAGNOSIS — E782 Mixed hyperlipidemia: Secondary | ICD-10-CM

## 2018-09-09 DIAGNOSIS — I10 Essential (primary) hypertension: Secondary | ICD-10-CM | POA: Diagnosis not present

## 2018-09-09 DIAGNOSIS — N183 Chronic kidney disease, stage 3 unspecified: Secondary | ICD-10-CM

## 2018-09-09 MED ORDER — EZETIMIBE 10 MG PO TABS: 10.0000 mg | ORAL_TABLET | Freq: Every day | ORAL | 3 refills | Status: DC

## 2018-09-09 MED ORDER — LOSARTAN POTASSIUM-HCTZ 100-25 MG PO TABS: 1.0000 | ORAL_TABLET | Freq: Every day | ORAL | 3 refills | Status: DC

## 2018-09-09 NOTE — Patient Instructions (Addendum)
Medication Instructions:  Your physician has recommended you make the following change in your medication:  1. START Ezetimibe (Zetia) 10 mg once daily 2. START Losartan-Hydrochlorothiazide (Hyzaar) 100-25 mg once daily 3. WEAN off of the amlodipine.  Please call with blood pressure measurements in the next few weeks. Your physician has requested that you regularly monitor and record your blood pressure readings at home. Please use the same machine at the same time of day to check your readings and record them to bring to your follow-up visit.  Please keep a log of your readings so that we can see a trend of them. Also see below on some important things about monitoring your blood pressures.   If you need a refill on your cardiac medications before your next appointment, please call your pharmacy.   Lab work: No new labs needed   If you have labs (blood work) drawn today and your tests are completely normal, you will receive your results only by: Marland Kitchen MyChart Message (if you have MyChart) OR . A paper copy in the mail If you have any lab test that is abnormal or we need to change your treatment, we will call you to review the results.   Testing/Procedures: No new testing needed    Follow-Up: At [facility], you and your health needs are our priority.  As part of our continuing mission to provide you with exceptional heart care, we have created designated Provider Care Teams.  These Care Teams include your primary Cardiologist (physician) and Advanced Practice Providers (APPs -  Physician Assistants and Nurse Practitioners) who all work together to provide you with the care you need, when you need it.  . You will need a follow up appointment in 12 months .   Please call our office 2 months in advance to schedule this appointment.    . Providers on your designated Care Team:   . Murray Hodgkins, NP . Christell Faith, PA-C . Marrianne Mood, PA-C  Any Other Special Instructions Will  Be Listed Below (If Applicable).  For educational health videos Log in to : [email or web address] Or : [email or web address], password : [email or web address]   How to Take Your Blood Pressure You can take your blood pressure at home with a machine. You may need to check your blood pressure at home:  To check if you have high blood pressure (hypertension).  To check your blood pressure over time.  To make sure your blood pressure medicine is working. Supplies needed: You will need a blood pressure machine, or monitor. You can buy one at a drugstore or online. When choosing one:  Choose one with an arm cuff.  Choose one that wraps around your upper arm. Only one finger should fit between your arm and the cuff.  Do not choose one that measures your blood pressure from your wrist or finger. Your doctor can suggest a monitor. How to prepare Avoid these things for 30 minutes before checking your blood pressure:  Drinking caffeine.  Drinking alcohol.  Eating.  Smoking.  Exercising. Five minutes before checking your blood pressure:  Pee.  Sit in a dining chair. Avoid sitting in a soft couch or armchair.  Be quiet. Do not talk. How to take your blood pressure Follow the instructions that came with your machine. If you have a digital blood pressure monitor, these may be the instructions: 1. Sit up straight. 2. Place your feet on the floor. Do not cross your ankles or legs. 3. Rest your left arm  at the level of your heart. You may rest it on a table, desk, or chair. 4. Pull up your shirt sleeve. 5. Wrap the blood pressure cuff around the upper part of your left arm. The cuff should be 1 inch (2.5 cm) above your elbow. It is best to wrap the cuff around bare skin. 6. Fit the cuff snugly around your arm. You should be able to place only one finger between the cuff and your arm. 7. Put the cord inside the groove of your elbow. 8. Press the power button. 9. Sit quietly while the cuff fills with air and  loses air. 10. Write down the numbers on the screen. 11. Wait 2-3 minutes and then repeat steps 1-10. What do the numbers mean? Two numbers make up your blood pressure. The first number is called systolic pressure. The second is called diastolic pressure. An example of a blood pressure reading is "120 over 80" (or 120/80). If you are an adult and do not have a medical condition, use this guide to find out if your blood pressure is normal: Normal  First number: below 120.  Second number: below 80. Elevated  First number: 120-129.  Second number: below 80. Hypertension stage 1  First number: 130-139.  Second number: 80-89. Hypertension stage 2  First number: 140 or above.  Second number: 20 or above. Your blood pressure is above normal even if only the top or bottom number is above normal. Follow these instructions at home:  Check your blood pressure as often as your doctor tells you to.  Take your monitor to your next doctor's appointment. Your doctor will: ? Make sure you are using it correctly. ? Make sure it is working right.  Make sure you understand what your blood pressure numbers should be.  Tell your doctor if your medicines are causing side effects. Contact a doctor if:  Your blood pressure keeps being high. Get help right away if:  Your first blood pressure number is higher than 180.  Your second blood pressure number is higher than 120. This information is not intended to replace advice given to you by your health care provider. Make sure you discuss any questions you have with your health care provider. Document Released: 02/24/2008 Document Revised: 02/09/2016 Document Reviewed: 08/20/2015 Elsevier Interactive Patient Education  2019 [employer] Inc.  Blood Pressure Record Sheet To take your blood pressure, you will need a blood pressure machine. You can buy a blood pressure machine (blood pressure monitor) at your clinic, drug store, or online. When  choosing one, consider:  An automatic monitor that has an arm cuff.  A cuff that wraps snugly around your upper arm. You should be able to fit only one finger between your arm and the cuff.  A device that stores blood pressure reading results.  Do not choose a monitor that measures your blood pressure from your wrist or finger. Follow your health care provider's instructions for how to take your blood pressure. To use this form:  Get one reading in the morning (a.m.) before you take any medicines.  Get one reading in the evening (p.m.) before supper.  Take at least 2 readings with each blood pressure check. This makes sure the results are correct. Wait 1-2 minutes between measurements.  Write down the results in the spaces on this form.  Repeat this once a week, or as told by your health care provider.  Make a follow-up appointment with your health care provider to discuss the  results. Blood pressure log Date: _______________________  a.m. _____________________(1st reading) _____________________(2nd reading)  p.m. _____________________(1st reading) _____________________(2nd reading) Date: _______________________  a.m. _____________________(1st reading) _____________________(2nd reading)  p.m. _____________________(1st reading) _____________________(2nd reading) Date: _______________________  a.m. _____________________(1st reading) _____________________(2nd reading)  p.m. _____________________(1st reading) _____________________(2nd reading) Date: _______________________  a.m. _____________________(1st reading) _____________________(2nd reading)  p.m. _____________________(1st reading) _____________________(2nd reading) Date: _______________________  a.m. _____________________(1st reading) _____________________(2nd reading)  p.m. _____________________(1st reading) _____________________(2nd reading) This information is not intended to replace advice given to you by your  health care provider. Make sure you discuss any questions you have with your health care provider. Document Released: 12/10/2002 Document Revised: 03/13/2017 Document Reviewed: 03/13/2017 Elsevier Interactive Patient Education  2019 [employer].

## 2018-11-18 ENCOUNTER — Other Ambulatory Visit (HOSPITAL_COMMUNITY): Payer: Self-pay | Admitting: Neurology

## 2018-11-18 ENCOUNTER — Other Ambulatory Visit: Payer: Self-pay | Admitting: Neurology

## 2018-11-18 DIAGNOSIS — M4802 Spinal stenosis, cervical region: Secondary | ICD-10-CM

## 2018-11-28 ENCOUNTER — Ambulatory Visit
Admission: RE | Admit: 2018-11-28 | Discharge: 2018-11-28 | Disposition: A | Discharge status: Home / Self Care | Payer: Managed Care, Other (non HMO) | Source: Ambulatory Visit | Attending: Neurology | Admitting: Neurology

## 2018-11-28 ENCOUNTER — Other Ambulatory Visit: Payer: Self-pay

## 2018-11-28 DIAGNOSIS — M4802 Spinal stenosis, cervical region: Secondary | ICD-10-CM | POA: Diagnosis present

## 2019-03-13 ENCOUNTER — Other Ambulatory Visit: Payer: Self-pay | Admitting: Cardiovascular Disease

## 2019-04-15 ENCOUNTER — Telehealth: Payer: Self-pay

## 2019-04-15 NOTE — Telephone Encounter (Signed)
Attempted to call patient about tomorrows visit, line was busy. No answer

## 2019-04-15 NOTE — Progress Notes (Signed)
Patient: Chris Johnson  Service Category: E/M  Provider: Gaspar Cola, MD  DOB: 1950-06-02  DOS: 04/16/2019  Location: Office  MRN: [id number]  Setting: Ambulatory outpatient  Referring Provider: [facility] [place]  Type: Established Patient  Specialty: Interventional Pain Management  PCP: [facility] [place]  Location: Remote location  Delivery: TeleHealth     Virtual Encounter - Pain Management PROVIDER NOTE: Information contained herein reflects review and annotations entered in association with encounter. Interpretation of such information and data should be left to medically-trained personnel. Information provided to patient can be located elsewhere in the medical record under "Patient Instructions". Document created using STT-dictation technology, any transcriptional errors that may result from process are unintentional.    Contact & Pharmacy Preferred: [phone] Home: [phone] (home) Mobile: [phone] (mobile) E-mail: [id number] Chris [address], [address] [place] [address] Phone: [phone] Fax: [phone]   Pre-screening  Chris Johnson offered "in-person" vs "virtual" encounter. He indicated preferring virtual for this encounter.   Reason COVID-19*  Social distancing based on CDC and AMA recommendations.   I contacted Chris Johnson on 04/16/2019 via telephone.      I clearly identified myself as Gaspar Cola, MD. I verified that I was speaking with the correct person using two identifiers (Name: Chris Johnson, and date of birth: Aug 22, 1950).  Consent I sought verbal advanced consent from Chris Johnson for virtual visit interactions. I informed Chris Johnson of possible security and privacy concerns, risks, and limitations associated with providing "not-in-person" medical evaluation and management services. I also informed Chris Johnson of the availability of "in-person" appointments. Finally, I informed him that there would be a charge for the virtual visit and that he could be  personally, fully or partially, financially responsible for it. Chris Johnson expressed understanding and agreed to proceed.   Historic Elements   Chris Johnson is a 69 y.o. year old, male patient evaluated today after his last encounter by our practice on 04/15/2019. Chris Johnson  has a past medical history of Arthritis, Gout (10/25/2017), Hyperlipidemia, Hypertension, and Normal cardiac stress test (1995). He also  has a past surgical history that includes Varicose vein surgery and colonoscopy. Chris Johnson has a current medication list which includes the following prescription(s): allopurinol, ascorbic acid, colchicine, folic acid, losartan-hydrochlorothiazide, potassium chloride, vitamin b-12, zolpidem, aspirin ec, baclofen, ezetimibe, melatonin, and pregabalin. He  reports that he has never smoked. He has never used smokeless tobacco. He reports current alcohol use of about 5.0 standard drinks of alcohol per week. He reports that he does not use drugs. Chris Johnson is allergic to atorvastatin and celexa [citalopram hydrobromide].   HPI  Today, he is being contacted for worsening of previously known (established) problem.  Today I had a very long conversation with the patient.  As it turns out, he still having that his primary pain is that of his feet, bilaterally, with the right being just as bad as the left.  This is followed by his calf pain, which is also bilateral with the right being equal to the left and then that is followed by the thigh pain which is also bilateral and equal on both sides.  The patient had a nerve conduction test done that demonstrated generalized sensory polyneuropathy of unknown origin.  He does not have a history of diabetes or any other conditions that could clearly explain this polyneuropathy.  The nerve conduction test was  done at the [facility] by Dr. Manuella Ghazi on 08/05/2018.  Patient had also had a venous ultrasound and an arterial ultrasound that demonstrated atherosclerosis of both lower extremities, but no DVTs.  Previously I had given him a prescription for baclofen for the leg cramps, pregabalin for the neuropathic pain, and melatonin to help him sleep and also to help with the neuropathy.  He indicates that since the melatonin did not help him sleep, he stopped taking it.  The Lyrica which I had prescribed to take 25 mg p.o. 3 times daily, he has continued to take only 1 pill/day but he refers that it is really not helping him that much.  However he is also indicating not having any side effects to it.  The baclofen, which I also wrote for him to take 1 tablet p.o. 3 times daily, he has been taking only 1 daily.  Today I took time to explain to him about the medicines again explaining that there melatonin was not only to help him sleep but also to help with the neuropathy.  He indicated that he will start taking it again.  Regarding the Lyrica, he indicates that its not really helping that much but today I have pointed out to him that we were attempting to titrated up as tolerated to a possible maximum of 450 mg/day and that at the 25 mg/day that he is currently taking it was not likely that he was begin to be getting a whole lot of benefit from that one.  Now that he understands that we are trying to titrated, he indicates that he will try to increase it as we had suggested.  Furthermore, I make sure that he understood the medication needs to be taken on a regular basis and that it takes anywhere from 2 to 3 weeks for blood levels to rise to the point where it is helping him.  I reminded him that because the medication is to prevent pain from happening, he does need to have it on board so that he can be doing just that.  The biggest concern that I have with him is that in reviewing his symptoms, he has indicated that his  pain gets worse when he starts exercising and the more he walks the worse he gets and that usually limits how much she can do.  The way that he has described this pain I am concerned that this may be a vascular claudication.  Reviewing the other things that we know about him, there is a cardiac CT scan that shows evidence of diffuse calcifications of the ascending aorta and some of the coronary arteries.  The CT scan was done in 2018.  The arterial ultrasound was done on 05/13/2018 and demonstrated atherosclerotic changes with triphasic waveform on both lower extremities.  This in addition to lab work demonstrating hyperlipidemia would suggest some of this to be associated with vascular problems.  Furthermore and more this concerning is the fact that with all of this in mind and without the patient having complained about it I asked him if he was experiencing.'s of dizziness compatible with TIAs and he has admitted that he has been experiencing that, but has not talked to his physician about it.  Today I made it clear that he needed to talk to his PCP about this since this could be precursor of a stroke.  Today I will go ahead and refill his medications and we will start titrating his  pregabalin up until we get him more comfortable.  I have also recommended that he start taking an 81 mg baby aspirin on a daily basis.  Pharmacotherapy Assessment  Analgesic: None MME/day: 0 mg/day.   UDS:  Summary  Date Value Ref Range Status  04/22/2018 FINAL  Final    Comment:    ==================================================================== TOX[facility] COMP DRUG ANALYSIS,UR ==================================================================== Test                             Result       Flag       Units Drug Present and Declared for Prescription Verification   Zolpidem                       PRESENT      EXPECTED   Zolpidem Acid                  PRESENT      EXPECTED    Zolpidem acid is an expected metabolite  of zolpidem. ==================================================================== Test                      Result    Flag   Units      Ref Range   Creatinine              145              mg/dL      >=20 ==================================================================== Declared Medications:  The flagging and interpretation on this report are based on the  following declared medications.  Unexpected results may arise from  inaccuracies in the declared medications.  **Note: The testing scope of this panel does not include small to  moderate amounts of these reported medications:  Zolpidem (Ambien)  **Note: The testing scope of this panel does not include following  reported medications:  Allopurinol (Zyloprim)  Amlodipine Besylate  Colchicine  Losartan (Cozaar)  Rosuvastatin (Crestor) ==================================================================== For clinical consultation, please call [phone]. ====================================================================    Laboratory Chemistry Profile (12 mo)  Renal: 04/22/2018: BUN 14; BUN/Creatinine Ratio 11; Creatinine, Ser 1.22  Lab Results  Component Value Date   GFR 63.17 12/14/2015   GFRAA 71 04/22/2018   GFRNONAA 62 04/22/2018   Hepatic: 04/22/2018: Albumin 4.6 Lab Results  Component Value Date   AST 24 04/22/2018   ALT 12 12/14/2015   Other: 04/22/2018: 25-Hydroxy, Vitamin D 31; 25-Hydroxy, Vitamin D-2 <1.0; 25-Hydroxy, Vitamin D-3 31; CRP <1; Sed Rate 25; Vitamin B-12 380 Note: Above Lab results reviewed.  Imaging  MR CERVICAL SPINE WO CONTRAST CLINICAL DATA:  Chronic bilateral arm pain with numbness and tingling for the past 2 years.  EXAM: MRI CERVICAL SPINE WITHOUT CONTRAST  TECHNIQUE: Multiplanar, multisequence MR imaging of the cervical spine was performed. No intravenous contrast was administered.  COMPARISON:  None.  FINDINGS: Alignment: Mild straightening of the normal cervical  lordosis.  Vertebrae: No fracture, evidence of discitis, or bone lesion.  Cord: Normal signal and morphology.  Posterior Fossa, vertebral arteries, paraspinal tissues: Negative.  Disc levels:  C2-C3:  Negative.  C3-C4:  Minimal disc bulging.  No stenosis.  C4-C5: Small broad-based posterior disc osteophyte complex contacting the left ventral cord. Bilateral uncovertebral hypertrophy. Moderate bilateral neuroforaminal stenosis. No spinal canal stenosis.  C5-C6: Small broad-based posterior disc osteophyte complex. Bilateral uncovertebral hypertrophy. Moderate to severe left and mild right neuroforaminal stenosis. No spinal canal stenosis.  C6-C7: Minimal  disc bulging. Mild left facet uncovertebral hypertrophy. No stenosis.  C7-T1: Negative disc. Moderate left facet arthropathy. No stenosis.  IMPRESSION: 1. Multilevel cervical spondylosis as described above. Moderate to severe left neuroforaminal stenosis at C5-C6. 2. Posterior disc osteophyte complex at C4-C5 contacts the left ventral cord. Moderate bilateral neuroforaminal stenosis at this level.  Electronically Signed   By: Titus Dubin M.D.   On: 11/28/2018 13:45   Assessment  The primary encounter diagnosis was Chronic lower extremity pain (Primary Area of Pain) (Bilateral) (L>R). 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  Plan of Care  Problem-specific:  No problem-specific Assessment & Plan notes found for this encounter.  I have discontinued Peng Terral's rosuvastatin, losartan, and amLODipine. I have also changed his pregabalin and Melatonin. Additionally, I am having him start on aspirin EC. Lastly, I am having him maintain his allopurinol, colchicine, zolpidem, ezetimibe, losartan-hydrochlorothiazide, potassium chloride, folic acid, ascorbic acid, vitamin B-12, and baclofen.  Pharmacotherapy (Medications Ordered): Meds ordered this encounter  Medications  . baclofen (LIORESAL) 10 MG tablet    Sig: Take 1 tablet (10 mg total) by mouth 3 (three) times daily.    Dispense:  90 tablet    Refill:  5    Do not place this medication, or any other prescription from our practice, on "Automatic Refill". Patient may have prescription filled one day early if pharmacy is closed on scheduled refill date.  . pregabalin (LYRICA) 25 MG capsule    Sig: Take 2 capsules (50 mg total) by mouth at bedtime for 15 days, THEN 3 capsules (75 mg total) at bedtime for 15 days.    Dispense:  75 capsule    Refill:  0  . Melatonin 10 MG CAPS    Sig: Take 20 mg by mouth at bedtime.    Dispense:  60 capsule    Refill:  0    Do not add to the electronic "Automatic Refill" notification system. Patient may have prescription filled one day early if pharmacy is closed on scheduled refill date.  Marland Kitchen aspirin EC 81 MG tablet    Sig: Take 1 tablet (81 mg total) by mouth daily.    Dispense:  90 tablet    Refill:  3    Fill one day early if pharmacy is closed on scheduled refill date. May substitute for generic if available.   Orders:  No orders of the defined types were placed in this encounter.  Follow-up plan:   Return in about 4 weeks  (around 05/14/2019) for (VV), (MM) to evaluate Lyrica titration.      Interventional options: Planned follow-up:      Considering:   Diagnostic intra-articular elbow injections Diagnostic intra-articular ankle joint injections    Palliative PRN treatment(s):   None at this time    Recent Visits No visits were found meeting these conditions.  Showing recent visits within past 90 days and meeting all other requirements  Today's Visits Date Type Provider Dept  04/16/19 Telemedicine Milinda Pointer, MD [facility]  Showing today's visits and meeting all other requirements   Future Appointments No visits were found meeting these conditions.  Showing future appointments within next 90 days and meeting all other requirements   I discussed the assessment and treatment plan with the patient. The patient was provided an opportunity to ask questions and all were answered. The patient agreed with the plan and demonstrated an understanding of the instructions.  Patient advised to call back or seek an in-person evaluation if the symptoms or condition worsens.  Duration of encounter: 35 minutes.  Note by: Gaspar Cola, MD Date: 04/16/2019; Time: 1:06 PM

## 2019-04-16 ENCOUNTER — Other Ambulatory Visit: Payer: Self-pay

## 2019-04-16 ENCOUNTER — Encounter: Payer: Self-pay | Admitting: Pain Medicine

## 2019-04-16 ENCOUNTER — Ambulatory Visit: Payer: Managed Care, Other (non HMO) | Attending: Pain Medicine | Admitting: Pain Medicine

## 2019-04-16 DIAGNOSIS — M4722 Other spondylosis with radiculopathy, cervical region: Secondary | ICD-10-CM

## 2019-04-16 DIAGNOSIS — M25571 Pain in right ankle and joints of right foot: Secondary | ICD-10-CM

## 2019-04-16 DIAGNOSIS — Z8673 Personal history of transient ischemic attack (TIA), and cerebral infarction without residual deficits: Secondary | ICD-10-CM

## 2019-04-16 DIAGNOSIS — M4802 Spinal stenosis, cervical region: Secondary | ICD-10-CM

## 2019-04-16 DIAGNOSIS — G8929 Other chronic pain: Secondary | ICD-10-CM

## 2019-04-16 DIAGNOSIS — M79662 Pain in left lower leg: Secondary | ICD-10-CM | POA: Diagnosis not present

## 2019-04-16 DIAGNOSIS — G459 Transient cerebral ischemic attack, unspecified: Secondary | ICD-10-CM | POA: Insufficient documentation

## 2019-04-16 DIAGNOSIS — M25572 Pain in left ankle and joints of left foot: Secondary | ICD-10-CM

## 2019-04-16 DIAGNOSIS — M5030 Other cervical disc degeneration, unspecified cervical region: Secondary | ICD-10-CM

## 2019-04-16 DIAGNOSIS — M79604 Pain in right leg: Secondary | ICD-10-CM

## 2019-04-16 DIAGNOSIS — M79601 Pain in right arm: Secondary | ICD-10-CM

## 2019-04-16 DIAGNOSIS — G608 Other hereditary and idiopathic neuropathies: Secondary | ICD-10-CM | POA: Insufficient documentation

## 2019-04-16 DIAGNOSIS — I70213 Atherosclerosis of native arteries of extremities with intermittent claudication, bilateral legs: Secondary | ICD-10-CM

## 2019-04-16 DIAGNOSIS — M25521 Pain in right elbow: Secondary | ICD-10-CM

## 2019-04-16 DIAGNOSIS — M9971 Connective tissue and disc stenosis of intervertebral foramina of cervical region: Secondary | ICD-10-CM

## 2019-04-16 DIAGNOSIS — G894 Chronic pain syndrome: Secondary | ICD-10-CM

## 2019-04-16 DIAGNOSIS — G4700 Insomnia, unspecified: Secondary | ICD-10-CM

## 2019-04-16 DIAGNOSIS — M542 Cervicalgia: Secondary | ICD-10-CM | POA: Insufficient documentation

## 2019-04-16 DIAGNOSIS — R937 Abnormal findings on diagnostic imaging of other parts of musculoskeletal system: Secondary | ICD-10-CM

## 2019-04-16 DIAGNOSIS — M79602 Pain in left arm: Secondary | ICD-10-CM

## 2019-04-16 DIAGNOSIS — M792 Neuralgia and neuritis, unspecified: Secondary | ICD-10-CM

## 2019-04-16 DIAGNOSIS — R252 Cramp and spasm: Secondary | ICD-10-CM

## 2019-04-16 DIAGNOSIS — M79605 Pain in left leg: Secondary | ICD-10-CM

## 2019-04-16 DIAGNOSIS — M25522 Pain in left elbow: Secondary | ICD-10-CM

## 2019-04-16 DIAGNOSIS — M79661 Pain in right lower leg: Secondary | ICD-10-CM

## 2019-04-16 DIAGNOSIS — M47812 Spondylosis without myelopathy or radiculopathy, cervical region: Secondary | ICD-10-CM | POA: Insufficient documentation

## 2019-04-16 MED ORDER — ASPIRIN EC 81 MG PO TBEC: 81.0000 mg | DELAYED_RELEASE_TABLET | Freq: Every day | ORAL | 3 refills | Status: DC

## 2019-04-16 MED ORDER — BACLOFEN 10 MG PO TABS: 10.0000 mg | ORAL_TABLET | Freq: Three times a day (TID) | ORAL | 5 refills | Status: DC

## 2019-04-16 MED ORDER — MELATONIN 10 MG PO CAPS: 20.0000 mg | ORAL_CAPSULE | Freq: Every day | ORAL | 0 refills | Status: DC

## 2019-04-16 MED ORDER — PREGABALIN 25 MG PO CAPS: ORAL_CAPSULE | ORAL | 0 refills | Status: DC

## 2019-04-16 NOTE — Patient Instructions (Addendum)
____________________________________________________________________________________________  Muscle Spasms & Cramps  Cause:  The most common cause of muscle spasms and cramps is vitamin and/or electrolyte (calcium, potassium, sodium, etc.) deficiencies.  Possible triggers: Sweating - causes loss of electrolytes thru the skin. Steroids - causes loss of electrolytes thru the urine.  Treatment: 1. Gatorade (or any other electrolyte-replenishing drink) - Take 1, 8 oz glass with each meal (3 times a day). 2. OTC (over-the-counter) Magnesium 400 to 500 mg - Take 1 tablet twice a day (one with breakfast and one before bedtime). If you have kidney problems, talk to your primary care physician before taking any Magnesium. 3. Tonic Water with quinine - Take 1, 8 oz glass before bedtime.   ____________________________________________________________________________________________   ____________________________________________________________________________________________  Pain Prevention Technique  Definition:   A technique used to minimize the effects of an activity known to cause inflammation or swelling, which in turn leads to an increase in pain.  Purpose: To prevent swelling from occurring. It is based on the fact that it is easier to prevent swelling from happening than it is to get rid of it, once it occurs.  Contraindications: 1. Anyone with allergy or hypersensitivity to the recommended medications. 2. Anyone taking anticoagulants (Blood Thinners) (e.g., Coumadin, Warfarin, Plavix, etc.). 3. Patients in Renal Failure.  Technique: Before you undertake an activity known to cause pain, or a flare-up of your chronic pain, and before you experience any pain, do the following:  1. On a full stomach, take 4 (four) over the counter Ibuprofens 200mg  tablets (Motrin), for a total of 800 mg. 2. In addition, take over the counter Magnesium 400 to 500 mg, before doing the activity.   3. Six (6) hours later, again on a full stomach, repeat the Ibuprofen. 4. That night, take a warm shower and stretch under the running warm water.  This technique may be sufficient to abort the pain and discomfort before it happens. Keep in mind that it takes a lot less medication to prevent swelling than it takes to eliminate it once it occurs.  ____________________________________________________________________________________________

## 2019-05-07 ENCOUNTER — Telehealth: Payer: Self-pay | Admitting: Urology

## 2019-05-07 NOTE — Telephone Encounter (Signed)
Called pt no answer. LM for pt informing him that he needs to contact PCP.

## 2019-05-07 NOTE — Telephone Encounter (Signed)
Pt LMOM and asked if Dr could add a Lime Disease and Uric Acid test could be added to his lab appt on 05/09/2019.

## 2019-05-07 NOTE — Telephone Encounter (Signed)
This should be forwarded to PCP, lyme disease and uric acid for gout is outside my scope of practice in urology  Nickolas Madrid, MD 05/07/2019

## 2019-05-08 ENCOUNTER — Telehealth: Payer: Self-pay | Admitting: *Deleted

## 2019-05-08 ENCOUNTER — Encounter: Payer: Self-pay | Admitting: Pain Medicine

## 2019-05-08 NOTE — Telephone Encounter (Signed)
Attempted to call for pre appointment review of allergies/meds. Message left. 

## 2019-05-09 ENCOUNTER — Other Ambulatory Visit: Payer: Self-pay

## 2019-05-09 ENCOUNTER — Other Ambulatory Visit: Payer: Managed Care, Other (non HMO)

## 2019-05-09 DIAGNOSIS — Z125 Encounter for screening for malignant neoplasm of prostate: Secondary | ICD-10-CM

## 2019-05-10 LAB — PSA TOTAL (REFLEX TO FREE): Prostate Specific Ag, Serum: 0.9 ng/mL (ref 0.0–4.0)

## 2019-05-11 NOTE — Progress Notes (Signed)
Patient: Chris Johnson  Service Category: E/M  Provider: Gaspar Cola, MD  DOB: 1951-03-10  DOS: 05/12/2019  Location: Office  MRN: [id number]  Setting: Ambulatory outpatient  Referring Provider: [facility] [place]  Type: Established Patient  Specialty: Interventional Pain Management  PCP: [facility] [place]  Location: Remote location  Delivery: TeleHealth     Virtual Encounter - Pain Management PROVIDER NOTE: Information contained herein reflects review and annotations entered in association with encounter. Interpretation of such information and data should be left to medically-trained personnel. Information provided to patient can be located elsewhere in the medical record under "Patient Instructions". Document created using STT-dictation technology, any transcriptional errors that may result from process are unintentional.    Contact & Pharmacy Preferred: [phone] Home: [phone] (home) Mobile: [phone] (mobile) E-mail: [id number] [address] [place] [address] Phone: [phone] Fax: [phone]   Pre-screening  Chris Johnson offered "in-person" vs "virtual" encounter. He indicated preferring virtual for this encounter.   Reason COVID-19*  Social distancing based on CDC and AMA recommendations.   I contacted Chris Johnson on 05/12/2019 via telephone.      I clearly identified myself as Gaspar Cola, MD. I verified that I was speaking with the correct person using two identifiers (Name: Tomoki Lucken, and date of birth: 06-Jan-1951).  Consent I sought verbal advanced consent from Chris Johnson for virtual visit interactions. I informed Chris Johnson of possible security and privacy concerns, risks, and limitations associated with providing "not-in-person" medical evaluation and management services. I also informed Chris Johnson of the availability of "in-person" appointments. Finally, I informed him that there would be a charge for the virtual visit and that he could be  personally, fully or partially, financially responsible for it. Chris Johnson expressed understanding and agreed to proceed.   Historic Elements   Chris Johnson is a 69 y.o. year old, male patient evaluated today after his last contact with our practice on 05/08/2019. Chris Johnson  has a past medical history of Arthritis, Gout (10/25/2017), Hyperlipidemia, Hypertension, and Normal cardiac stress test (1995). He also  has a past surgical history that includes Varicose vein surgery and colonoscopy. Chris Johnson has a current medication list which includes the following prescription(s): allopurinol, ascorbic acid, aspirin ec, baclofen, carbidopa-levodopa, colchicine, folic acid, losartan-hydrochlorothiazide, melatonin, potassium chloride, pregabalin, vitamin b-12, zolpidem, and ezetimibe. He  reports that he has never smoked. He has never used smokeless tobacco. He reports current alcohol use of about 5.0 standard drinks of alcohol per week. He reports that he does not use drugs. Chris Johnson is allergic to atorvastatin and celexa [citalopram hydrobromide].   HPI  Today, he is being contacted for medication management.  Today were contacting the patient to evaluate his Lyrica titration.  The patient indicated that he was able to go up to 1 tablet p.o. twice daily, but when he went up to 3 times daily, he began to experience night cramps.  Today I spent quite some time talking to him about the most common reasons for those cramps and I have provided him with written information on the patient instruction section of the chart, for the treatment of those cramps.  I recommended that he start using a multivitamin daily, magnesium, Gatorade, and tonic water at bedtime.  I also recommended that he start using some calcium and vitamin D along with the magnesium.  Today  I have refilled his prescription and I will check back with him in 1 month to see how he is doing with the Lyrica titration and see if the recommendations help to eliminate some of the cramps.  Pharmacotherapy Assessment  Analgesic: None MME/day: 0 mg/day.   Monitoring: [place] PMP: PDMP reviewed during this encounter.       Pharmacotherapy: No side-effects or adverse reactions reported. Compliance: No problems identified. Effectiveness: Clinically acceptable. Plan: Refer to "POC".  UDS:  Summary  Date Value Ref Range Status  04/22/2018 FINAL  Final    Comment:    ==================================================================== TOXAS[facility]RE COMP DRUG ANALYSIS,UR ==================================================================== Test                             Result       Flag       Units Drug Present and Declared for Prescription Verification   Zolpidem                       PRESENT      EXPECTED   Zolpidem Acid                  PRESENT      EXPECTED    Zolpidem acid is an expected metabolite of zolpidem. ==================================================================== Test                      Result    Flag   Units      Ref Range   Creatinine              145              mg/dL      >=20 ==================================================================== Declared Medications:  The flagging and interpretation on this report are based on the  following declared medications.  Unexpected results may arise from  inaccuracies in the declared medications.  **Note: The testing scope of this panel does not include small to  moderate amounts of these reported medications:  Zolpidem (Ambien)  **Note: The testing scope of this panel does not include following  reported medications:  Allopurinol (Zyloprim)  Amlodipine Besylate  Colchicine  Losartan (Cozaar)  Rosuvastatin (Crestor) ==================================================================== For clinical consultation,  please call [phone]. ====================================================================    Laboratory Chemistry Profile   Renal Lab Results  Component Value Date   BUN 14 04/22/2018   CREATININE 1.22 04/22/2018   BCR 11 04/22/2018   GFR 63.17 12/14/2015   GFRAA 71 04/22/2018   GFRNONAA 62 04/22/2018    Hepatic Lab Results  Component Value Date   AST 24 04/22/2018   ALT 12 12/14/2015   ALBUMIN 4.6 04/22/2018   ALKPHOS 83 04/22/2018    Electrolytes Lab Results  Component Value Date   NA 142 04/22/2018   K 4.0 04/22/2018   CL 105 04/22/2018   CALCIUM 9.7 04/22/2018   MG 2.2 04/22/2018    Bone Lab Results  Component Value Date   25OHVITD1 31 04/22/2018   25OHVITD2 <1.0 04/22/2018   25OHVITD3 31 04/22/2018    Coagulation Lab Results  Component Value Date   PLT 262.0 12/14/2015    Cardiovascular Lab Results  Component Value Date   TROPONINI <0.01 05/24/2012   HGB 15.3 12/14/2015   HCT 44.4 12/14/2015    Inflammation (CRP: Acute Phase) (ESR: Chronic Phase) Lab Results  Component Value Date   CRP <1 04/22/2018   ESRSEDRATE 25  04/22/2018      Note: Above Lab results reviewed.  Imaging  MR CERVICAL SPINE WO CONTRAST CLINICAL DATA:  Chronic bilateral arm pain with numbness and tingling for the past 2 years.  EXAM: MRI CERVICAL SPINE WITHOUT CONTRAST  TECHNIQUE: Multiplanar, multisequence MR imaging of the cervical spine was performed. No intravenous contrast was administered.  COMPARISON:  None.  FINDINGS: Alignment: Mild straightening of the normal cervical lordosis.  Vertebrae: No fracture, evidence of discitis, or bone lesion.  Cord: Normal signal and morphology.  Posterior Fossa, vertebral arteries, paraspinal tissues: Negative.  Disc levels:  C2-C3:  Negative.  C3-C4:  Minimal disc bulging.  No stenosis.  C4-C5: Small broad-based posterior disc osteophyte complex contacting the left ventral cord. Bilateral  uncovertebral hypertrophy. Moderate bilateral neuroforaminal stenosis. No spinal canal stenosis.  C5-C6: Small broad-based posterior disc osteophyte complex. Bilateral uncovertebral hypertrophy. Moderate to severe left and mild right neuroforaminal stenosis. No spinal canal stenosis.  C6-C7: Minimal disc bulging. Mild left facet uncovertebral hypertrophy. No stenosis.  C7-T1: Negative disc. Moderate left facet arthropathy. No stenosis.  IMPRESSION: 1. Multilevel cervical spondylosis as described above. Moderate to severe left neuroforaminal stenosis at C5-C6. 2. Posterior disc osteophyte complex at C4-C5 contacts the left ventral cord. Moderate bilateral neuroforaminal stenosis at this level.  Electronically Signed   By: Titus Dubin M.D.   On: 11/28/2018 13:45  Assessment  The primary encounter diagnosis was Chronic pain syndrome. 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  Plan of Care  Problem-specific:  No problem-specific Assessment & Plan notes found for this encounter.  I am having Chris Johnson maintain his allopurinol, colchicine, zolpidem, ezetimibe, losartan-hydrochlorothiazide, potassium chloride, folic acid, ascorbic acid, vitamin B-12, baclofen, aspirin EC, carbidopa-levodopa, pregabalin, and Melatonin.  Pharmacotherapy (Medications Ordered): Meds ordered this encounter  Medications  . pregabalin (LYRICA) 25 MG capsule    Sig: Take 2 capsules (50 mg total) by mouth at bedtime for 15 days, THEN 3 capsules (75 mg total) at bedtime for 15 days.    Dispense:  75 capsule    Refill:  0  . Melatonin 10 MG CAPS    Sig: Take 20 mg by  mouth at bedtime.    Dispense:  60 capsule    Refill:  0    Do not add to the electronic "Automatic Refill" notification system. Patient may have prescription filled one day early if pharmacy is closed on scheduled refill date.   Orders:  No orders of the defined types were placed in this encounter.  Follow-up plan:   Return in about 1 month (around 06/09/2019) for (VV), (MM) to follow-up Lyrica titration and recommendations for treatment of cramps.      Interventional options: Planned follow-up:      Considering:   Diagnostic intra-articular elbow injections Diagnostic intra-articular ankle joint injections    Palliative PRN treatment(s):   None at this time     Recent Visits Date Type Provider Dept  04/16/19 Telemedicine Milinda Pointer, MD [facility]  Showing recent visits within past 90 days and meeting all other requirements   Today's Visits Date Type Provider Dept  05/12/19 Telemedicine Milinda Pointer, MD [facility]  Showing today's visits and meeting all other requirements   Future Appointments No  visits were found meeting these conditions.  Showing future appointments within next 90 days and meeting all other requirements   I discussed the assessment and treatment plan with the patient. The patient was provided an opportunity to ask questions and all were answered. The patient agreed with the plan and demonstrated an understanding of the instructions.  Patient advised to call back or seek an in-person evaluation if the symptoms or condition worsens.  Duration of encounter: 15 minutes.  Note by: Gaspar Cola, MD Date: 05/12/2019; Time: 9:31 AM

## 2019-05-12 ENCOUNTER — Ambulatory Visit: Payer: Managed Care, Other (non HMO) | Attending: Pain Medicine | Admitting: Pain Medicine

## 2019-05-12 ENCOUNTER — Ambulatory Visit: Payer: Managed Care, Other (non HMO) | Admitting: Urology

## 2019-05-12 ENCOUNTER — Other Ambulatory Visit: Payer: Self-pay

## 2019-05-12 DIAGNOSIS — M79605 Pain in left leg: Secondary | ICD-10-CM

## 2019-05-12 DIAGNOSIS — M25571 Pain in right ankle and joints of right foot: Secondary | ICD-10-CM | POA: Diagnosis not present

## 2019-05-12 DIAGNOSIS — G608 Other hereditary and idiopathic neuropathies: Secondary | ICD-10-CM

## 2019-05-12 DIAGNOSIS — M25572 Pain in left ankle and joints of left foot: Secondary | ICD-10-CM

## 2019-05-12 DIAGNOSIS — M25521 Pain in right elbow: Secondary | ICD-10-CM

## 2019-05-12 DIAGNOSIS — G894 Chronic pain syndrome: Secondary | ICD-10-CM | POA: Diagnosis not present

## 2019-05-12 DIAGNOSIS — G4700 Insomnia, unspecified: Secondary | ICD-10-CM

## 2019-05-12 DIAGNOSIS — G8929 Other chronic pain: Secondary | ICD-10-CM

## 2019-05-12 DIAGNOSIS — M79604 Pain in right leg: Secondary | ICD-10-CM

## 2019-05-12 DIAGNOSIS — M792 Neuralgia and neuritis, unspecified: Secondary | ICD-10-CM

## 2019-05-12 DIAGNOSIS — M25522 Pain in left elbow: Secondary | ICD-10-CM

## 2019-05-12 MED ORDER — MELATONIN 10 MG PO CAPS: 20.0000 mg | ORAL_CAPSULE | Freq: Every day | ORAL | 0 refills | Status: DC

## 2019-05-12 MED ORDER — PREGABALIN 25 MG PO CAPS: ORAL_CAPSULE | ORAL | 0 refills | Status: DC

## 2019-05-12 NOTE — Patient Instructions (Signed)
____________________________________________________________________________________________  Muscle Spasms & Cramps  Cause:  The most common cause of muscle spasms and cramps is vitamin and/or electrolyte (calcium, potassium, sodium, etc.) deficiencies.  Possible triggers: Sweating - causes loss of electrolytes thru the skin. Steroids - causes loss of electrolytes thru the urine.  Treatment: 1. Gatorade (or any other electrolyte-replenishing drink) - Take 1, 8 oz glass with each meal (3 times a day). 2. OTC (over-the-counter) Magnesium 400 to 500 mg - Take 1 tablet twice a day (one with breakfast and one before bedtime). If you have kidney problems, talk to your primary care physician before taking any Magnesium. 3. Tonic Water with quinine - Take 1, 8 oz glass before bedtime.   ____________________________________________________________________________________________    

## 2019-05-14 ENCOUNTER — Ambulatory Visit: Payer: Managed Care, Other (non HMO) | Admitting: Urology

## 2019-05-14 ENCOUNTER — Other Ambulatory Visit: Payer: Self-pay

## 2019-05-14 VITALS — BP 152/87 | HR 61 | Wt 194.7 lb

## 2019-05-14 DIAGNOSIS — R102 Pelvic and perineal pain: Secondary | ICD-10-CM | POA: Diagnosis not present

## 2019-05-14 DIAGNOSIS — R300 Dysuria: Secondary | ICD-10-CM | POA: Diagnosis not present

## 2019-05-14 DIAGNOSIS — Z125 Encounter for screening for malignant neoplasm of prostate: Secondary | ICD-10-CM | POA: Diagnosis not present

## 2019-05-14 LAB — BLADDER SCAN AMB NON-IMAGING

## 2019-05-14 NOTE — Progress Notes (Signed)
   05/14/2019 11:06 AM   [name] 11-14-1950 [id number]  Reason for visit: Follow up PSA screening, chronic scrotal pain  HPI: I saw Mr. Mogul back in urology clinic for follow-up of the above issues.  He is a 69 year old male with some chronic lower extremity pain who I originally saw in February 2020 for routine PSA screening and chronic scrotal pain.  He also has a family history of prostate cancer in his father.  His PSA is stable at 0.7 from 1.7 last year, and 1.5 in 2017.  He does not have any urinary symptoms or complaints at this time.  He continues to be sexually active multiple times per week and denies any difficulty with erections.  At our last visit, we had discussed conservative measures for scrotal pain including anti-inflammatories, snug fitting underwear, and icing as needed.  He reports has been doing very well over the last year and denies any new scrotal pain or urinary problems over the last few months.  Overall, he feels he is doing very well.  We had a long conversation today about PSA screening and the AUA guidelines that recommend discontinuing screening at age 97.  His PSA is very reassuring at 0.7 at this time.   RTC 1 year with PSA prior  I spent 20 total minutes on the day of the encounter including pre-visit review of the medical record, face-to-face time with the patient, and post visit ordering of labs/imaging/tests.  Billey Co, [facility] [address] [place], [place] [address] [phone]

## 2019-05-14 NOTE — Patient Instructions (Signed)
Prostate Cancer Screening  Prostate cancer screening is a test that is done to check for the presence of prostate cancer in men. The prostate gland is a walnut-sized gland that is located below the bladder and in front of the rectum in males. The function of the prostate is to add fluid to semen during ejaculation. Prostate cancer is the second most common type of cancer in men. Who should have prostate cancer screening?  Screening recommendations vary based on age and other risk factors. Screening is recommended if:  You are older than age 55. If you are age 55-69, talk with your health care provider about your need for screening and how often screening should be done. Because most prostate cancers are slow growing and will not cause death, screening is generally reserved in this age group for men who have a 10-15-year life expectancy.  You are younger than age 55, and you have these risk factors: ? Being a black male or a male of African descent. ? Having a father, brother, or uncle who has been diagnosed with prostate cancer. The risk is higher if your family member's cancer occurred at an early age. Screening is not recommended if:  You are younger than age 40.  You are between the ages of 40 and 54 and you have no risk factors.  You are 70 years of age or older. At this age, the risks that screening can cause are greater than the benefits that it may provide. If you are at high risk for prostate cancer, your health care provider may recommend that you have screenings more often or that you start screening at a younger age. How is screening for prostate cancer done? The recommended prostate cancer screening test is a blood test called the prostate-specific antigen (PSA) test. PSA is a protein that is made in the prostate. As you age, your prostate naturally produces more PSA. Abnormally high PSA levels may be caused by:  Prostate cancer.  An enlarged prostate that is not caused by cancer  (benign prostatic hyperplasia, BPH). This condition is very common in older men.  A prostate gland infection (prostatitis). Depending on the PSA results, you may need more tests, such as:  A physical exam to check the size of your prostate gland.  Blood and imaging tests.  A procedure to remove tissue samples from your prostate gland for testing (biopsy). What are the benefits of prostate cancer screening?  Screening can help to identify cancer at an early stage, before symptoms start and when the cancer can be treated more easily.  There is a small chance that screening may lower your risk of dying from prostate cancer. The chance is small because prostate cancer is a slow-growing cancer, and most men with prostate cancer die from a different cause. What are the risks of prostate cancer screening? The main risk of prostate cancer screening is diagnosing and treating prostate cancer that would never have caused any symptoms or problems. This is called overdiagnosisand overtreatment. PSA screening cannot tell you if your PSA is high due to cancer or a different cause. A prostate biopsy is the only procedure to diagnose prostate cancer. Even the results of a biopsy may not tell you if your cancer needs to be treated. Slow-growing prostate cancer may not need any treatment other than monitoring, so diagnosing and treating it may cause unnecessary stress or other side effects. A prostate biopsy may also cause:  Infection or fever.  A false negative. This is   a result that shows that you do not have prostate cancer when you actually do have prostate cancer. Questions to ask your health care provider  When should I start prostate cancer screening?  What is my risk for prostate cancer?  How often do I need screening?  What type of screening tests do I need?  How do I get my test results?  What do my results mean?  Do I need treatment? Where to find more information  The American Cancer  Society: www.cancer.org  American Urological Association: www.auanet.org Contact a health care provider if:  You have difficulty urinating.  You have pain when you urinate or ejaculate.  You have blood in your urine or semen.  You have pain in your back or in the area of your prostate. Summary  Prostate cancer is a common type of cancer in men. The prostate gland is located below the bladder and in front of the rectum. This gland adds fluid to semen during ejaculation.  Prostate cancer screening may identify cancer at an early stage, when the cancer can be treated more easily.  The prostate-specific antigen (PSA) test is the recommended screening test for prostate cancer.  Discuss the risks and benefits of prostate cancer screening with your health care provider. If you are age 70 or older, the risks that screening can cause are greater than the benefits that it may provide. This information is not intended to replace advice given to you by your health care provider. Make sure you discuss any questions you have with your health care provider. Document Revised: 10/24/2018 Document Reviewed: 10/24/2018 Elsevier Patient Education  2020 [employer].  

## 2019-06-04 ENCOUNTER — Encounter: Payer: Self-pay | Admitting: Pain Medicine

## 2019-06-04 ENCOUNTER — Telehealth: Payer: Self-pay

## 2019-06-04 NOTE — Telephone Encounter (Signed)
LM for patient to call office regarding pre virtual appointment questions.  

## 2019-06-05 ENCOUNTER — Telehealth: Payer: Self-pay | Admitting: *Deleted

## 2019-06-05 NOTE — Telephone Encounter (Signed)
Attempted to call for pre appointment review of allergies/meds. Message left. 

## 2019-06-06 ENCOUNTER — Encounter: Payer: Self-pay | Admitting: Pain Medicine

## 2019-06-08 NOTE — Progress Notes (Signed)
Patient: Chris Johnson  Service Category: E/M  Provider: Gaspar Cola, MD  DOB: [birth year]  DOS: 06/09/2019  Location: Office  MRN: [id number]  Setting: Ambulatory outpatient  Referring Provider: [facility] [place]  Type: Established Patient  Specialty: Interventional Pain Management  PCP: [facility] [place]  Location: Remote location  Delivery: TeleHealth     Virtual Encounter - Pain Management PROVIDER NOTE: Information contained herein reflects review and annotations entered in association with encounter. Interpretation of such information and data should be left to medically-trained personnel. Information provided to patient can be located elsewhere in the medical record under "Patient Instructions". Document created using STT-dictation technology, any transcriptional errors that may result from process are unintentional.    Contact & Pharmacy Preferred: [phone] Home: [phone] (home) Mobile: [phone] (mobile) E-mail: [id number] [address], [place] [address] [place] [address] Phone: [phone] Fax: [phone]   Pre-screening  Mr. Chris Johnson offered "in-person" vs "virtual" encounter. He indicated preferring virtual for this encounter.   Reason COVID-19*  Social distancing based on CDC and AMA recommendations.   I contacted Chris Johnson on 06/09/2019 via telephone.      I clearly identified myself as Gaspar Cola, MD. I verified that I was speaking with the correct person using two identifiers (Name: Chris Johnson, and date of birth: October 09, 1950).  Consent I sought verbal advanced consent from Chris Johnson for virtual visit interactions. I informed Chris Johnson of possible security and privacy concerns, risks, and limitations associated with providing "not-in-person" medical evaluation and management services. I also informed Mr.  Johnson of the availability of "in-person" appointments. Finally, I informed him that there would be a charge for the virtual visit and that he could be  personally, fully or partially, financially responsible for it. Chris Johnson expressed understanding and agreed to proceed.   Historic Elements   Chris Johnson is a 69 y.o. year old, male patient evaluated today after his last contact with our practice on 06/05/2019. Chris Johnson  has a past medical history of Arthritis, Gout (10/25/2017), Hyperlipidemia, Hypertension, and Normal cardiac stress test (1995). He also  has a past surgical history that includes Varicose vein surgery and colonoscopy. Chris Johnson has a current medication list which includes the following prescription(s): allopurinol, ascorbic acid, aspirin ec, baclofen, carbidopa-levodopa, colchicine, ezetimibe, folic acid, losartan-hydrochlorothiazide, potassium chloride, pregabalin, and zolpidem. He  reports that he has never smoked. He has never used smokeless tobacco. He reports current alcohol use of about 5.0 standard drinks of alcohol per week. He reports that he does not use drugs. Chris Johnson is allergic to atorvastatin and celexa [citalopram hydrobromide].   HPI  Today, he is being contacted for medication management. Today's encounter is to follow-up Lyrica titration and recommendations for treatment of cramps.   He indicates having gone up on the Lyrica to 50 mg twice daily, but he has encounter some vertigo and short-term memory impairment secondary to the medication.  I have given him some reassurance that that is a typical side effect of the medication, especially if you attempt to go up on it too quickly.  I have recommended that he stay on the current dose until those symptoms go away, at which time he can then attempt to go a little further up on the dose, if needed.  He indicates that the use of the medication has helped the pain in the upper  portion of the leg, but lower  portion, between the knee and the foot, still giving him some problems.  He also indicates that the quinine and the tonic water and the recommendations that I gave him for the cramps as helped and he is very happy with that.  He is also taking the baclofen 2 tablets twice a day, which is also helping with the cramps.  I have recommended that we stay the course on these medications and should we reach a point where we cannot increase them any further because of side effects, then we will revisit the issue of the interventional therapies to see if we can get the pain better through that route.  He understood and agreed.  Today I will switch him from the Lyrica 25 mg 2 tablets p.o. twice daily to Lyrica 50 mg 1 tablet p.o. twice daily.  I have also recommended that he keep the Lyrica 25 mg pills available to later use him for they increases.  Pharmacotherapy Assessment  Analgesic: None MME/day: 0 mg/day.   Monitoring:  PMP: PDMP reviewed during this encounter.       Pharmacotherapy: No side-effects or adverse reactions reported. Compliance: No problems identified. Effectiveness: Clinically acceptable. Plan: Refer to "POC".  UDS:  Summary  Date Value Ref Range Status  04/22/2018 FINAL  Final    Comment:    ==================================================================== TOXASSURE COMP DRUG ANALYSIS,UR ==================================================================== Test                             Result       Flag       Units Drug Present and Declared for Prescription Verification   Zolpidem                       PRESENT      EXPECTED   Zolpidem Acid                  PRESENT      EXPECTED    Zolpidem acid is an expected metabolite of zolpidem. ==================================================================== Test                      Result    Flag   Units      Ref Range   Creatinine              145              mg/dL       >=20 ==================================================================== Declared Medications:  The flagging and interpretation on this report are based on the  following declared medications.  Unexpected results may arise from  inaccuracies in the declared medications.  **Note: The testing scope of this panel does not include small to  moderate amounts of these reported medications:  Zolpidem (Ambien)  **Note: The testing scope of this panel does not include following  reported medications:  Allopurinol (Zyloprim)  Amlodipine Besylate  Colchicine  Losartan (Cozaar)  Rosuvastatin (Crestor) ==================================================================== For clinical consultation, please call [phone]. ====================================================================    Laboratory Chemistry Profile   Renal Lab Results  Component Value Date   BUN 14 04/22/2018   CREATININE 1.22 04/22/2018   BCR 11 04/22/2018   GFR 63.17 12/14/2015   GFRAA 71 04/22/2018   GFRNONAA 62 04/22/2018    Hepatic Lab Results  Component Value Date   AST 24 04/22/2018   ALT 12 12/14/2015   ALBUMIN 4.6  04/22/2018   ALKPHOS 83 04/22/2018    Electrolytes Lab Results  Component Value Date   NA 142 04/22/2018   K 4.0 04/22/2018   CL 105 04/22/2018   CALCIUM 9.7 04/22/2018   MG 2.2 04/22/2018    Bone Lab Results  Component Value Date   25OHVITD1 31 04/22/2018   25OHVITD2 <1.0 04/22/2018   25OHVITD3 31 04/22/2018    Inflammation (CRP: Acute Phase) (ESR: Chronic Phase) Lab Results  Component Value Date   CRP <1 04/22/2018   ESRSEDRATE 25 04/22/2018      Note: Above Lab results reviewed.  Imaging  MR CERVICAL SPINE WO CONTRAST CLINICAL DATA:  Chronic bilateral arm pain with numbness and tingling for the past 2 years.  EXAM: MRI CERVICAL SPINE WITHOUT CONTRAST  TECHNIQUE: Multiplanar, multisequence MR imaging of the cervical spine was performed. No intravenous contrast  was administered.  COMPARISON:  None.  FINDINGS: Alignment: Mild straightening of the normal cervical lordosis.  Vertebrae: No fracture, evidence of discitis, or bone lesion.  Cord: Normal signal and morphology.  Posterior Fossa, vertebral arteries, paraspinal tissues: Negative.  Disc levels:  C2-C3:  Negative.  C3-C4:  Minimal disc bulging.  No stenosis.  C4-C5: Small broad-based posterior disc osteophyte complex contacting the left ventral cord. Bilateral uncovertebral hypertrophy. Moderate bilateral neuroforaminal stenosis. No spinal canal stenosis.  C5-C6: Small broad-based posterior disc osteophyte complex. Bilateral uncovertebral hypertrophy. Moderate to severe left and mild right neuroforaminal stenosis. No spinal canal stenosis.  C6-C7: Minimal disc bulging. Mild left facet uncovertebral hypertrophy. No stenosis.  C7-T1: Negative disc. Moderate left facet arthropathy. No stenosis.  IMPRESSION: 1. Multilevel cervical spondylosis as described above. Moderate to severe left neuroforaminal stenosis at C5-C6. 2. Posterior disc osteophyte complex at C4-C5 contacts the left ventral cord. Moderate bilateral neuroforaminal stenosis at this level.  Electronically Signed   By: Titus Dubin M.D.   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  Plan of Care  Problem-specific:  No problem-specific Assessment & Plan notes found for this encounter.  Chris Johnson has a current medication list which includes the following long-term medication(s): allopurinol, aspirin ec, baclofen, carbidopa-levodopa, colchicine, ezetimibe, losartan-hydrochlorothiazide, potassium chloride, and pregabalin.  Pharmacotherapy  (Medications Ordered): Meds ordered this encounter  Medications  . pregabalin (LYRICA) 50 MG capsule    Sig: Take 1 capsule (50 mg total) by mouth 2 (two) times daily.    Dispense:  180 capsule    Refill:  1    Fill one day early if pharmacy is closed on scheduled refill date. May substitute for generic if available.  . baclofen (LIORESAL) 10 MG tablet    Sig: Take 2 tablets (20 mg total) by mouth 2 (two) times daily.    Dispense:  120 tablet    Refill:  5    Do not place this medication, or any other prescription from our practice, on "Automatic Refill". Patient may have prescription filled one day early if pharmacy is closed on scheduled refill date.   Orders:  No orders of the defined types were placed in this encounter.  Follow-up plan:   Return in about 6 months (around 12/03/2019) for (VV), (MM) to evaluate Lyrica titration.      Interventional options: Planned follow-up:      Considering:   Diagnostic IA elbow injections  Diagnostic IA ankle joint injections    Palliative PRN treatment(s):   None at this time    Recent Visits Date Type Provider Dept  05/12/19 Telemedicine Milinda Pointer, MD [facility]  04/16/19 Telemedicine Milinda Pointer, MD [facility]  Showing recent visits within past 90 days and meeting all other requirements   Today's Visits Date Type Provider Dept  06/09/19 Telemedicine Milinda Pointer, MD [facility]  Showing today's visits and meeting all other requirements   Future Appointments No visits were found meeting these conditions.  Showing future appointments within next 90 days and meeting all other requirements   I discussed the assessment and treatment plan with the patient. The patient was provided an opportunity to ask questions and all were answered. The patient agreed with the plan and demonstrated an understanding of the instructions.  Patient advised to call back or seek an in-person evaluation  if the symptoms or condition worsens.  Duration of encounter: 14 minutes.  Note by: Gaspar Cola, MD Date: 06/09/2019; Time: 10:41 AM

## 2019-06-09 ENCOUNTER — Ambulatory Visit: Payer: Managed Care, Other (non HMO) | Attending: Pain Medicine | Admitting: Pain Medicine

## 2019-06-09 ENCOUNTER — Other Ambulatory Visit: Payer: Self-pay

## 2019-06-09 DIAGNOSIS — G8929 Other chronic pain: Secondary | ICD-10-CM

## 2019-06-09 DIAGNOSIS — R252 Cramp and spasm: Secondary | ICD-10-CM

## 2019-06-09 DIAGNOSIS — M792 Neuralgia and neuritis, unspecified: Secondary | ICD-10-CM | POA: Diagnosis not present

## 2019-06-09 DIAGNOSIS — M79661 Pain in right lower leg: Secondary | ICD-10-CM | POA: Diagnosis not present

## 2019-06-09 DIAGNOSIS — M79604 Pain in right leg: Secondary | ICD-10-CM | POA: Diagnosis not present

## 2019-06-09 DIAGNOSIS — M79662 Pain in left lower leg: Secondary | ICD-10-CM

## 2019-06-09 DIAGNOSIS — M79605 Pain in left leg: Secondary | ICD-10-CM

## 2019-06-09 DIAGNOSIS — G608 Other hereditary and idiopathic neuropathies: Secondary | ICD-10-CM | POA: Diagnosis not present

## 2019-06-09 MED ORDER — PREGABALIN 50 MG PO CAPS: 50.0000 mg | ORAL_CAPSULE | Freq: Two times a day (BID) | ORAL | 1 refills | Status: DC

## 2019-06-09 MED ORDER — BACLOFEN 10 MG PO TABS: 20.0000 mg | ORAL_TABLET | Freq: Two times a day (BID) | ORAL | 5 refills | Status: DC

## 2019-07-10 DIAGNOSIS — I2510 Atherosclerotic heart disease of native coronary artery without angina pectoris: Secondary | ICD-10-CM | POA: Insufficient documentation

## 2019-07-11 ENCOUNTER — Encounter: Payer: Self-pay | Admitting: Cardiovascular Disease

## 2019-07-27 DIAGNOSIS — G20 Parkinson's disease: Secondary | ICD-10-CM | POA: Insufficient documentation

## 2019-07-27 DIAGNOSIS — G629 Polyneuropathy, unspecified: Secondary | ICD-10-CM | POA: Insufficient documentation

## 2019-08-11 ENCOUNTER — Other Ambulatory Visit: Payer: Self-pay | Admitting: Neurology

## 2019-08-11 ENCOUNTER — Other Ambulatory Visit (HOSPITAL_COMMUNITY): Payer: Self-pay | Admitting: Neurology

## 2019-08-11 DIAGNOSIS — R4189 Other symptoms and signs involving cognitive functions and awareness: Secondary | ICD-10-CM

## 2019-08-19 ENCOUNTER — Ambulatory Visit
Admission: RE | Admit: 2019-08-19 | Discharge: 2019-08-19 | Disposition: A | Discharge status: Home / Self Care | Payer: Managed Care, Other (non HMO) | Source: Ambulatory Visit | Attending: Neurology | Admitting: Neurology

## 2019-08-19 ENCOUNTER — Other Ambulatory Visit: Payer: Self-pay

## 2019-08-19 ENCOUNTER — Ambulatory Visit: Payer: Managed Care, Other (non HMO) | Attending: Neurology | Admitting: Physical Therapy

## 2019-08-19 DIAGNOSIS — M25571 Pain in right ankle and joints of right foot: Secondary | ICD-10-CM | POA: Diagnosis present

## 2019-08-19 DIAGNOSIS — M2560 Stiffness of unspecified joint, not elsewhere classified: Secondary | ICD-10-CM

## 2019-08-19 DIAGNOSIS — M6281 Muscle weakness (generalized): Secondary | ICD-10-CM

## 2019-08-19 DIAGNOSIS — M25572 Pain in left ankle and joints of left foot: Secondary | ICD-10-CM | POA: Insufficient documentation

## 2019-08-19 DIAGNOSIS — R4189 Other symptoms and signs involving cognitive functions and awareness: Secondary | ICD-10-CM | POA: Diagnosis present

## 2019-08-19 DIAGNOSIS — R269 Unspecified abnormalities of gait and mobility: Secondary | ICD-10-CM | POA: Diagnosis present

## 2019-08-23 ENCOUNTER — Encounter: Payer: Self-pay | Admitting: Physical Therapy

## 2019-08-23 NOTE — Therapy (Signed)
[facility] [address] [place], [place], [address] Phone: [phone]   Fax:  [phone]  Physical Therapy Evaluation  Patient Details  Name: Chris Johnson MRN: [id number] Date of Birth: August 27, 1950 Referring Provider (PT): Dr. Jennings Books   Encounter Date: 08/19/2019  PT End of Session - 08/23/19 2040    Visit Number  1    Number of Visits  9    Date for PT Re-Evaluation  09/16/19    Authorization - Visit Number  1    Authorization - Number of Visits  10    PT Start Time  [id number]    PT Stop Time  1524    PT Time Calculation (min)  53 min    Activity Tolerance  Patient tolerated treatment well;Patient limited by pain    Behavior During Therapy  [facility] for tasks assessed/performed       Past Medical History:  Diagnosis Date  . Arthritis   . Gout 10/25/2017  . Hyperlipidemia   . Hypertension   . Normal cardiac stress test 1995   cardiolyte    Past Surgical History:  Procedure Laterality Date  . colonoscopy    . VARICOSE VEIN SURGERY      There were no vitals filed for this visit.   Subjective Assessment - 08/23/19 2031    Subjective  Pt. c/o stiffness of joints in lower leg/ feet.  Pt. reports 4/10 pain in B feet (along base of metatarsals/ lateral aspect of foot).  Pt. c/o 10/10 pain at worst.    Pertinent History  Pt. reports >6 months of cramping/ pain in B lower legs/feet.  Pt. has gel inserts in shoes (not orthotics).  Increase pain with car rides/ traveling.  Pt. states he is unable to walk >10 minutes due to increase pain.  Pt. unable to walk a mile.  Weight bearing helps B LE cramping.    Limitations  Standing;Walking;Sitting    How long can you walk comfortably?  <10 minutes.    Patient Stated Goals  Decrease pain in B lower legs/ feet    Currently in Pain?  Yes    Pain Score  4     Pain Location  Foot    Pain Orientation  Left;Right    Pain Descriptors / Indicators  Aching;Cramping    Pain Type  Chronic  pain         [facility] PT Assessment - 08/23/19 0001      Assessment   Medical Diagnosis  Stiffness of joint of lower leg    Referring Provider (PT)  Dr. Jennings Books    Onset Date/Surgical Date  03/28/19    Prior Therapy  No      Precautions   Precautions  None      Restrictions   Weight Bearing Restrictions  No      Home Environment   Living Environment  Private residence      Prior Function   Level of Independence  Independent      Cognition   Overall Cognitive Status  Within Functional Limits for tasks assessed          See flowsheet     Objective measurements completed on examination: See above findings.        PT Education - 08/23/19 2040    Education Details  Orthotic fitting/ proper shoe recommendations/ Use of compression stockings with traveling.    Person(s) Educated  Patient    Methods  Explanation;Demonstration  Comprehension  Verbalized understanding;Returned demonstration          PT Long Term Goals - 08/23/19 2050      PT LONG TERM GOAL #1   Title  Pt. will increase FOTO to 54 to improve functional mobility.    Baseline  Initial FOTO: 41    Time  4    Period  Weeks    Status  New    Target Date  09/16/19      PT LONG TERM GOAL #2   Title  Pt. will report 4/10 B lower leg/ foot pain at worst with daily function.    Baseline  4/10 pain at rest and 10/10 pain at worst.  Wt. bearing helps cramping.    Time  4    Period  Weeks    Status  New    Target Date  09/16/19      PT LONG TERM GOAL #3   Title  Pt. able to ambulate 10 minutes on level surfaces with no increase c/o B lower leg/ foot pain.    Baseline  Pt. limited by walking distance due to pain.    Time  4    Period  Weeks    Status  New    Target Date  09/16/19             Plan - 08/23/19 2041    Clinical Impression Statement  Pt. is a pleasant 69 y/o male with chronic h/o B lower leg/ foot pain.  Pt. reports 4/10 pain currently at rest (along base of metatarsal/  lateral foot) and 10/10 at worst.  Pt. has cramping in B lower legs and wt. bearing helps.  Pt. ambulates with normalized gait pattern but limited endurance/ distance due to pain.  Standing posture WNL.  B hip/knee AROM WNL.  (+) L knee anterior drawer (mensical tear/ no ACL).  R/ L ankle AROM: DF (12/14 deg.), PF (58/58 deg.), IV and EV WNL bilaterally.  Slight hypomobility of B subtalar/ navicular joints.  B LE muscle strength grossly 5/5 MMT except hip flexion 4+/5 MM.  Good ankle strength.  FOTO: initial 41/ goal 54.  Pt. will benefit from skilled PT services/ manual tx. to increase B lower leg/ foot joint mobility to improve pain-free function.    Stability/Clinical Decision Making  Stable/Uncomplicated    Clinical Decision Making  Low    Rehab Potential  Good    PT Frequency  2x / week    PT Duration  4 weeks    PT Treatment/Interventions  ADLs/Self Care Home Management;Cryotherapy;Moist Heat;Electrical Stimulation;Gait training;Stair training;Functional mobility training;Neuromuscular re-education;Balance training;Therapeutic exercise;Therapeutic activities;Patient/family education;Orthotic Fit/Training;Manual techniques;Taping;Dry needling;Passive range of motion    PT Next Visit Plan  Joint mobs. to foot/ ankle.  Use of Hypervolt       Patient will benefit from skilled therapeutic intervention in order to improve the following deficits and impairments:  Abnormal gait, Decreased balance, Decreased endurance, Decreased mobility, Difficulty walking, Hypomobility, Improper body mechanics, Decreased range of motion, Decreased activity tolerance, Decreased safety awareness, Decreased strength, Hypermobility, Impaired flexibility, Pain  Visit Diagnosis: Stiffness in joint  Muscle weakness (generalized)  Pain in joints of both feet  Gait difficulty     Problem List Patient Active Problem List   Diagnosis Date Noted  . Cervicalgia 04/16/2019  . DDD (degenerative disc disease), cervical  04/16/2019  . Chronic upper extremity pain (Bilateral) 04/16/2019  . Abnormal MRI, cervical spine 04/16/2019  . Cervical facet hypertrophy (Multilevel) 04/16/2019  .  Cervical foraminal stenosis (Severe) (C5-6) (Left) 04/16/2019  . Cervical Neural foraminal stenosis (Multilevel) (Bilateral) (C4-5, C5-6) 04/16/2019  . Osteoarthritis of cervical spine w/ radiculopathy 04/16/2019  . Atherosclerosis of native artery of both lower extremities with intermittent claudication ([place]) 04/16/2019  . 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 TIA (transient ischemic attack) 04/16/2019  . Bilateral leg cramps 04/16/2019  . Coronary artery calcification seen on CT scan 09/09/2018  . Chronic peripheral neuropathic pain 05/20/2018  . Chronic musculoskeletal pain 05/20/2018  . Polycystic kidney disease 05/06/2018  . Bilateral calf pain 05/06/2018  . HLD (hyperlipidemia) 04/22/2018  . Chronic lower extremity pain (Primary Area of Pain) (Bilateral) (L>R) 04/22/2018  . Chronic ankle pain (Secondary Area of Pain) (Bilateral) (L>R) 04/22/2018  . Chronic elbow pain [facility] Area of Pain) (Bilateral) (R>L) 04/22/2018  . Chronic pain syndrome 04/22/2018  . Pharmacologic therapy 04/22/2018  . Disorder of skeletal system 04/22/2018  . Problems influencing health status 04/22/2018  . Chronic gouty arthropathy without tophi 02/18/2018  . Chronic kidney disease (CKD), stage III (moderate) 02/18/2018  . Encounter for long-term (current) use of high-risk medication 02/18/2018  . Cough 10/03/2016  . Annual physical exam 12/14/2015  . Allergic rhinitis 04/06/2014  . Family history of prostate cancer 06/23/2013  . Hyperlipidemia 06/23/2013  . Depression with anxiety 05/24/2012  . Hypertension 01/30/2011  . Insomnia 01/30/2011   Pura Spice, PT, DPT # [id number] 08/23/2019, 8:55 PM  [facility] [address] [place], [place],  [address] Phone: [phone]   Fax:  [phone]  Name: Nieem Rayford MRN: [id number] Date of Birth: [birth year]

## 2019-08-28 ENCOUNTER — Ambulatory Visit: Payer: Managed Care, Other (non HMO) | Admitting: Physical Therapy

## 2019-09-03 ENCOUNTER — Other Ambulatory Visit: Payer: Self-pay

## 2019-09-03 ENCOUNTER — Encounter: Payer: Self-pay | Admitting: Physical Therapy

## 2019-09-03 ENCOUNTER — Ambulatory Visit: Payer: Managed Care, Other (non HMO) | Attending: Neurology | Admitting: Physical Therapy

## 2019-09-03 DIAGNOSIS — M25572 Pain in left ankle and joints of left foot: Secondary | ICD-10-CM | POA: Diagnosis present

## 2019-09-03 DIAGNOSIS — M2560 Stiffness of unspecified joint, not elsewhere classified: Secondary | ICD-10-CM | POA: Insufficient documentation

## 2019-09-03 DIAGNOSIS — M25571 Pain in right ankle and joints of right foot: Secondary | ICD-10-CM | POA: Diagnosis present

## 2019-09-03 DIAGNOSIS — M6281 Muscle weakness (generalized): Secondary | ICD-10-CM | POA: Insufficient documentation

## 2019-09-03 DIAGNOSIS — R269 Unspecified abnormalities of gait and mobility: Secondary | ICD-10-CM | POA: Diagnosis present

## 2019-09-03 NOTE — Therapy (Signed)
[facility] [address] [place], [place], [address] Phone: [phone]   Fax:  [phone]  Physical Therapy Treatment  Patient Details  Name: Chris Johnson MRN: [id number] Date of Birth: February 22, 1951 Referring Provider (PT): Dr. Jennings Books   Encounter Date: 09/03/2019  PT End of Session - 09/03/19 1345    Visit Number  2    Number of Visits  9    Date for PT Re-Evaluation  09/16/19    Authorization - Visit Number  2    Authorization - Number of Visits  10    PT Start Time  9381    PT Stop Time  1431    PT Time Calculation (min)  46 min    Activity Tolerance  Patient tolerated treatment well;Patient limited by pain    Behavior During Therapy  [facility] for tasks assessed/performed       Past Medical History:  Diagnosis Date  . Arthritis   . Gout 10/25/2017  . Hyperlipidemia   . Hypertension   . Normal cardiac stress test 1995   cardiolyte    Past Surgical History:  Procedure Laterality Date  . colonoscopy    . VARICOSE VEIN SURGERY      There were no vitals filed for this visit.  Subjective Assessment - 09/03/19 1908    Subjective  Pt. states he had a difficult previous week due to loss of friend.  Pt. reports joint stiffness in lower leg/ ankle but no pain at this time.  Pt. was sore for a day after initial evaluaiton but felt better a couple days after.    Pertinent History  Pt. reports >6 months of cramping/ pain in B lower legs/feet.  Pt. has gel inserts in shoes (not orthotics).  Increase pain with car rides/ traveling.  Pt. states he is unable to walk >10 minutes due to increase pain.  Pt. unable to walk a mile.  Weight bearing helps B LE cramping.    Limitations  Standing;Walking;Sitting    How long can you walk comfortably?  <10 minutes.    Patient Stated Goals  Decrease pain in B lower legs/ feet    Currently in Pain?  No/denies        Manual tx.:  Supine B ankle stretches all planes (static holds as  tolerated) Supine B ankle subtalar/ metatarsal/ navicular mobs. (grade II-III) STM to B gastroc/ plantar aspects of feet (use of Hypervolt) Amb. In hallway after manual tx. With no c/o pain.      PT Long Term Goals - 08/23/19 2050      PT LONG TERM GOAL #1   Title  Pt. will increase FOTO to 54 to improve functional mobility.    Baseline  Initial FOTO: 41    Time  4    Period  Weeks    Status  New    Target Date  09/16/19      PT LONG TERM GOAL #2   Title  Pt. will report 4/10 B lower leg/ foot pain at worst with daily function.    Baseline  4/10 pain at rest and 10/10 pain at worst.  Wt. bearing helps cramping.    Time  4    Period  Weeks    Status  New    Target Date  09/16/19      PT LONG TERM GOAL #3   Title  Pt. able to ambulate 10 minutes on level surfaces with no increase c/o B lower  leg/ foot pain.    Baseline  Pt. limited by walking distance due to pain.    Time  4    Period  Weeks    Status  New    Target Date  09/16/19            Plan - 09/03/19 1910    Clinical Impression Statement  Pt. ambulates with slight improvement in gait pattern/ heel strike while wearing sneakers.  Pt. able to manage LE muscle cramps with wt. bearing at night.  PT focused on manual tx./ stretches to B lower legs and ankles/feet.  Hypomobility noted in B ankle/ metatarsals with increase mobility after tx.  Good overall tx. tolerance.  Pt. instructed to stay active and contact PT if any worsening symptoms.    Stability/Clinical Decision Making  Stable/Uncomplicated    Clinical Decision Making  Low    Rehab Potential  Good    PT Frequency  2x / week    PT Duration  4 weeks    PT Treatment/Interventions  ADLs/Self Care Home Management;Cryotherapy;Moist Heat;Electrical Stimulation;Gait training;Stair training;Functional mobility training;Neuromuscular re-education;Balance training;Therapeutic exercise;Therapeutic activities;Patient/family education;Orthotic Fit/Training;Manual  techniques;Taping;Dry needling;Passive range of motion    PT Next Visit Plan  Joint mobs. to foot/ ankle.  Use of Hypervolt.    ISSUE HEP       Patient will benefit from skilled therapeutic intervention in order to improve the following deficits and impairments:  Abnormal gait, Decreased balance, Decreased endurance, Decreased mobility, Difficulty walking, Hypomobility, Improper body mechanics, Decreased range of motion, Decreased activity tolerance, Decreased safety awareness, Decreased strength, Hypermobility, Impaired flexibility, Pain  Visit Diagnosis: Stiffness in joint  Muscle weakness (generalized)  Pain in joints of both feet  Gait difficulty     Problem List Patient Active Problem List   Diagnosis Date Noted  . Cervicalgia 04/16/2019  . DDD (degenerative disc disease), cervical 04/16/2019  . Chronic upper extremity pain (Bilateral) 04/16/2019  . Abnormal MRI, cervical spine 04/16/2019  . Cervical facet hypertrophy (Multilevel) 04/16/2019  . Cervical foraminal stenosis (Severe) (C5-6) (Left) 04/16/2019  . Cervical Neural foraminal stenosis (Multilevel) (Bilateral) (C4-5, C5-6) 04/16/2019  . Osteoarthritis of cervical spine w/ radiculopathy 04/16/2019  . Atherosclerosis of native artery of both lower extremities with intermittent claudication ([place]) 04/16/2019  . 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 TIA (transient ischemic attack) 04/16/2019  . Bilateral leg cramps 04/16/2019  . Coronary artery calcification seen on CT scan 09/09/2018  . Chronic peripheral neuropathic pain 05/20/2018  . Chronic musculoskeletal pain 05/20/2018  . Polycystic kidney disease 05/06/2018  . Bilateral calf pain 05/06/2018  . HLD (hyperlipidemia) 04/22/2018  . Chronic lower extremity pain (Primary Area of Pain) (Bilateral) (L>R) 04/22/2018  . Chronic ankle pain (Secondary Area of Pain) (Bilateral) (L>R) 04/22/2018  . Chronic elbow pain [facility] Area  of Pain) (Bilateral) (R>L) 04/22/2018  . Chronic pain syndrome 04/22/2018  . Pharmacologic therapy 04/22/2018  . Disorder of skeletal system 04/22/2018  . Problems influencing health status 04/22/2018  . Chronic gouty arthropathy without tophi 02/18/2018  . Chronic kidney disease (CKD), stage III (moderate) 02/18/2018  . Encounter for long-term (current) use of high-risk medication 02/18/2018  . Cough 10/03/2016  . Annual physical exam 12/14/2015  . Allergic rhinitis 04/06/2014  . Family history of prostate cancer 06/23/2013  . Hyperlipidemia 06/23/2013  . Depression with anxiety 05/24/2012  . Hypertension 01/30/2011  . Insomnia 01/30/2011   Pura Spice, PT, DPT # [id number] 09/03/2019, 7:14 PM  [facility] [address]. [place], [place], [address] Phone: [phone]   Fax:  [phone]  Name: Chris Johnson MRN: [id number] Date of Birth: 06/26/1950

## 2019-09-11 ENCOUNTER — Encounter: Payer: Self-pay | Admitting: Physical Therapy

## 2019-09-11 ENCOUNTER — Other Ambulatory Visit: Payer: Self-pay

## 2019-09-11 ENCOUNTER — Ambulatory Visit: Payer: Managed Care, Other (non HMO) | Admitting: Physical Therapy

## 2019-09-11 DIAGNOSIS — M25571 Pain in right ankle and joints of right foot: Secondary | ICD-10-CM

## 2019-09-11 DIAGNOSIS — M6281 Muscle weakness (generalized): Secondary | ICD-10-CM

## 2019-09-11 DIAGNOSIS — M2560 Stiffness of unspecified joint, not elsewhere classified: Secondary | ICD-10-CM

## 2019-09-11 DIAGNOSIS — R269 Unspecified abnormalities of gait and mobility: Secondary | ICD-10-CM

## 2019-09-11 NOTE — Patient Instructions (Signed)
Access Code: [email or web address]: [email or web address]: 06/17/2021Prepared by: Legrand Como SherkExercises  Standing Gastroc Stretch on Step - 1 x daily - 7 x weekly - 3 sets - 10 reps - 20-30 hold  Soleus Stretch on Wall - 1 x daily - 7 x weekly - 3 sets - 10 reps - 20-30 hold

## 2019-09-11 NOTE — Therapy (Addendum)
[facility] [address] [place], [place], [address] Phone: [phone]   Fax:  [phone]  Physical Therapy Treatment  Patient Details  Name: Chris Johnson MRN: [id number] Date of Birth: 11/26/1950 Referring Provider (PT): Dr. Jennings Books   Encounter Date: 09/11/2019     PT End of Session - 09/11/19 1832    Visit Number 3    Number of Visits 9    Date for PT Re-Evaluation 09/16/19    Authorization - Visit Number 3    Authorization - Number of Visits 10    PT Start Time 1340    PT Stop Time 1433    PT Time Calculation (min) 53 min    Activity Tolerance Patient tolerated treatment well;Patient limited by pain    Behavior During Therapy [facility] for tasks assessed/performed            Past Medical History:  Diagnosis Date  . Arthritis   . Gout 10/25/2017  . Hyperlipidemia   . Hypertension   . Normal cardiac stress test 1995   cardiolyte    Past Surgical History:  Procedure Laterality Date  . colonoscopy    . VARICOSE VEIN SURGERY      There were no vitals filed for this visit.   Subjective Assessment - 09/17/19 1240    Subjective Pt. reports no new complaints.  Pt. had less soreness after last tx. session and reports compliance with HEP.    Pertinent History Pt. reports >6 months of cramping/ pain in B lower legs/feet.  Pt. has gel inserts in shoes (not orthotics).  Increase pain with car rides/ traveling.  Pt. states he is unable to walk >10 minutes due to increase pain.  Pt. unable to walk a mile.  Weight bearing helps B LE cramping.    Limitations Standing;Walking;Sitting    How long can you walk comfortably? <10 minutes.    Patient Stated Goals Decrease pain in B lower legs/ feet    Currently in Pain? Yes    Pain Score 4     Pain Location Foot    Pain Orientation Right;Left             Manual tx.:  Supine B ankle stretches all planes (static holds as tolerated) Supine B ankle subtalar/ metatarsal/  navicular mobs. (grade II-III) STM to B gastroc/ plantar aspects of feet (use of Hypervolt)   There.ex.:  Reviewed HEP See new handouts for gastroc stretches  PT will reassess goals next tx. session     PT Education - 09/17/19 6945    Education Details Updated HEP    Person(s) Educated Patient    Methods Explanation;Demonstration;Handout    Comprehension Verbalized understanding;Returned demonstration               PT Long Term Goals - 08/23/19 2050      PT LONG TERM GOAL #1   Title Pt. will increase FOTO to 54 to improve functional mobility.    Baseline Initial FOTO: 41    Time 4    Period Weeks    Status New    Target Date 09/16/19      PT LONG TERM GOAL #2   Title Pt. will report 4/10 B lower leg/ foot pain at worst with daily function.    Baseline 4/10 pain at rest and 10/10 pain at worst.  Wt. bearing helps cramping.    Time 4    Period Weeks    Status New  Target Date 09/16/19      PT LONG TERM GOAL #3   Title Pt. able to ambulate 10 minutes on level surfaces with no increase c/o B lower leg/ foot pain.    Baseline Pt. limited by walking distance due to pain.    Time 4    Period Weeks    Status New    Target Date 09/16/19              Plan - 09/17/19 1242    Clinical Impression Statement PT focused on ankle/ lower leg/ knee stretches in supine position.  Moderate hypomobility in B metatarsals/ ankle during mobs.  No c/o cramps during tx. session/ walking around PT clinic.  Good technique with HEP/ addition of stretches.    Stability/Clinical Decision Making Stable/Uncomplicated    Clinical Decision Making Low    Rehab Potential Good    PT Frequency 2x / week    PT Duration 4 weeks    PT Treatment/Interventions ADLs/Self Care Home Management;Cryotherapy;Moist Heat;Electrical Stimulation;Gait training;Stair training;Functional mobility training;Neuromuscular re-education;Balance training;Therapeutic exercise;Therapeutic activities;Patient/family  education;Orthotic Fit/Training;Manual techniques;Taping;Dry needling;Passive range of motion    PT Next Visit Plan Joint mobs. to foot/ ankle.  Use of Hypervolt.    CHECK GOALS/ RECERT           Patient will benefit from skilled therapeutic intervention in order to improve the following deficits and impairments:  Abnormal gait, Decreased balance, Decreased endurance, Decreased mobility, Difficulty walking, Hypomobility, Improper body mechanics, Decreased range of motion, Decreased activity tolerance, Decreased safety awareness, Decreased strength, Hypermobility, Impaired flexibility, Pain  Visit Diagnosis: Stiffness in joint  Muscle weakness (generalized)  Pain in joints of both feet  Gait difficulty     Problem List Patient Active Problem List   Diagnosis Date Noted  . Cervicalgia 04/16/2019  . DDD (degenerative disc disease), cervical 04/16/2019  . Chronic upper extremity pain (Bilateral) 04/16/2019  . Abnormal MRI, cervical spine 04/16/2019  . Cervical facet hypertrophy (Multilevel) 04/16/2019  . Cervical foraminal stenosis (Severe) (C5-6) (Left) 04/16/2019  . Cervical Neural foraminal stenosis (Multilevel) (Bilateral) (C4-5, C5-6) 04/16/2019  . Osteoarthritis of cervical spine w/ radiculopathy 04/16/2019  . Atherosclerosis of native artery of both lower extremities with intermittent claudication ([place]) 04/16/2019  . 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 TIA (transient ischemic attack) 04/16/2019  . Bilateral leg cramps 04/16/2019  . Coronary artery calcification seen on CT scan 09/09/2018  . Chronic peripheral neuropathic pain 05/20/2018  . Chronic musculoskeletal pain 05/20/2018  . Polycystic kidney disease 05/06/2018  . Bilateral calf pain 05/06/2018  . HLD (hyperlipidemia) 04/22/2018  . Chronic lower extremity pain (Primary Area of Pain) (Bilateral) (L>R) 04/22/2018  . Chronic ankle pain (Secondary Area of Pain) (Bilateral)  (L>R) 04/22/2018  . Chronic elbow pain [facility] Area of Pain) (Bilateral) (R>L) 04/22/2018  . Chronic pain syndrome 04/22/2018  . Pharmacologic therapy 04/22/2018  . Disorder of skeletal system 04/22/2018  . Problems influencing health status 04/22/2018  . Chronic gouty arthropathy without tophi 02/18/2018  . Chronic kidney disease (CKD), stage III (moderate) 02/18/2018  . Encounter for long-term (current) use of high-risk medication 02/18/2018  . Cough 10/03/2016  . Annual physical exam 12/14/2015  . Allergic rhinitis 04/06/2014  . Family history of prostate cancer 06/23/2013  . Hyperlipidemia 06/23/2013  . Depression with anxiety 05/24/2012  . Hypertension 01/30/2011  . Insomnia 01/30/2011   Pura Spice, PT, DPT # [id number] 09/17/2019, 12:44 PM  [facility] [address] [place], [place], [address] Phone: [phone]   Fax:  [phone]  Name: Chris Johnson MRN: [id number] Date of Birth: 27-Apr-1950

## 2019-09-17 ENCOUNTER — Encounter: Payer: Self-pay | Admitting: Physical Therapy

## 2019-09-17 ENCOUNTER — Ambulatory Visit: Payer: Managed Care, Other (non HMO) | Admitting: Physical Therapy

## 2019-09-17 ENCOUNTER — Other Ambulatory Visit: Payer: Self-pay

## 2019-09-17 DIAGNOSIS — M2560 Stiffness of unspecified joint, not elsewhere classified: Secondary | ICD-10-CM | POA: Diagnosis not present

## 2019-09-17 DIAGNOSIS — M6281 Muscle weakness (generalized): Secondary | ICD-10-CM

## 2019-09-17 DIAGNOSIS — R269 Unspecified abnormalities of gait and mobility: Secondary | ICD-10-CM

## 2019-09-17 DIAGNOSIS — M25571 Pain in right ankle and joints of right foot: Secondary | ICD-10-CM

## 2019-09-17 NOTE — Therapy (Signed)
[facility] [address] [place], [place], [address] Phone: [phone]   Fax:  [phone]  Physical Therapy Treatment  Patient Details  Name: Chris Johnson MRN: [id number] Date of Birth: 06/26/1950 Referring Provider (PT): Dr. Jennings Books   Encounter Date: 09/17/2019   PT End of Session - 09/17/19 1459    Visit Number 4    Number of Visits 12    Date for PT Re-Evaluation 10/15/19    Authorization - Visit Number 1    Authorization - Number of Visits 10    PT Start Time 1324    PT Stop Time 1430    PT Time Calculation (min) 45 min    Activity Tolerance Patient tolerated treatment well;Patient limited by pain    Behavior During Therapy [facility] for tasks assessed/performed           Past Medical History:  Diagnosis Date  . Arthritis   . Gout 10/25/2017  . Hyperlipidemia   . Hypertension   . Normal cardiac stress test 1995   cardiolyte    Past Surgical History:  Procedure Laterality Date  . colonoscopy    . VARICOSE VEIN SURGERY      There were no vitals filed for this visit.   Subjective Assessment - 09/17/19 1457    Subjective Pt reports L knee pain at 4/10 NPS. Pt had questions about relieving therapies for neuropathy for plantar surfaces of feet.    Pertinent History Pt. reports >6 months of cramping/ pain in B lower legs/feet.  Pt. has gel inserts in shoes (not orthotics).  Increase pain with car rides/ traveling.  Pt. states he is unable to walk >10 minutes due to increase pain.  Pt. unable to walk a mile.  Weight bearing helps B LE cramping.    Limitations Standing;Walking;Sitting    How long can you walk comfortably? <10 minutes.    Patient Stated Goals Decrease pain in B lower legs/ feet    Currently in Pain? Yes    Pain Score 4     Pain Location Knee           Manual Therapy: pt in supine   B Stretches: hamstring, piriformis, gastroc: 3x30 sec holds  B Talocrural mobs AP Grade 3: 3x30 sec L knee  distraction: 3x30 sec L knee AP tibiofemoral Grade 3 mob: 3x30 sec, towel roll under distal femur Hypervolt to B gastroc/soleus complex with contract relax techniques, 5 min/each Hypervolt to B plantar surface of feet for neuropathy pain to improve sensation and improve circulation.      PT Long Term Goals - 09/18/19 1637      PT LONG TERM GOAL #1   Title Pt. will increase FOTO to 54 to improve functional mobility.    Baseline Initial FOTO: 41    Time 4    Period Weeks    Status On-going    Target Date 10/15/19      PT LONG TERM GOAL #2   Title Pt. will report 4/10 B lower leg/ foot pain at worst with daily function.    Baseline 4/10 pain at rest and 10/10 pain at worst.  Wt. bearing helps cramping.    Time 4    Period Weeks    Status Partially Met    Target Date 10/15/19      PT LONG TERM GOAL #3   Title Pt. able to ambulate 10 minutes on level surfaces with no increase c/o B lower leg/ foot pain.  Baseline Pt. limited by walking distance due to pain.    Time 4    Period Weeks    Status On-going    Target Date 10/15/19                 Plan - 09/17/19 1459    Clinical Impression Statement With manual therapy applied to BLE's: L knee distraction, Grade III AP tibifemoral mobs, and grade III AP talocrural mobs and hamstring/piriformis stretch, gastroc stretch and hypervolt to gastrocs, pain reduction to 1-2/10 NPS. Prior to therapy pain reported at 4/10 NPS.  PT educated pt. on LE neuropathy/ importance of exercise and normalized gait pattern.  See updated goals.    Stability/Clinical Decision Making Stable/Uncomplicated    Clinical Decision Making Low    Rehab Potential Good    PT Frequency 2x / week    PT Duration 4 weeks    PT Treatment/Interventions ADLs/Self Care Home Management;Cryotherapy;Moist Heat;Electrical Stimulation;Gait training;Stair training;Functional mobility training;Neuromuscular re-education;Balance training;Therapeutic exercise;Therapeutic  activities;Patient/family education;Orthotic Fit/Training;Manual techniques;Taping;Dry needling;Passive range of motion    PT Next Visit Plan Joint mobs. to foot/ ankle.  Use of Hypervolt.   Progress HEP           Patient will benefit from skilled therapeutic intervention in order to improve the following deficits and impairments:  Abnormal gait, Decreased balance, Decreased endurance, Decreased mobility, Difficulty walking, Hypomobility, Improper body mechanics, Decreased range of motion, Decreased activity tolerance, Decreased safety awareness, Decreased strength, Hypermobility, Impaired flexibility, Pain  Visit Diagnosis: Stiffness in joint  Muscle weakness (generalized)  Pain in joints of both feet  Gait difficulty     Problem List Patient Active Problem List   Diagnosis Date Noted  . Cervicalgia 04/16/2019  . DDD (degenerative disc disease), cervical 04/16/2019  . Chronic upper extremity pain (Bilateral) 04/16/2019  . Abnormal MRI, cervical spine 04/16/2019  . Cervical facet hypertrophy (Multilevel) 04/16/2019  . Cervical foraminal stenosis (Severe) (C5-6) (Left) 04/16/2019  . Cervical Neural foraminal stenosis (Multilevel) (Bilateral) (C4-5, C5-6) 04/16/2019  . Osteoarthritis of cervical spine w/ radiculopathy 04/16/2019  . Atherosclerosis of native artery of both lower extremities with intermittent claudication ([place]) 04/16/2019  . 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 TIA (transient ischemic attack) 04/16/2019  . Bilateral leg cramps 04/16/2019  . Coronary artery calcification seen on CT scan 09/09/2018  . Chronic peripheral neuropathic pain 05/20/2018  . Chronic musculoskeletal pain 05/20/2018  . Polycystic kidney disease 05/06/2018  . Bilateral calf pain 05/06/2018  . HLD (hyperlipidemia) 04/22/2018  . Chronic lower extremity pain (Primary Area of Pain) (Bilateral) (L>R) 04/22/2018  . Chronic ankle pain (Secondary Area of  Pain) (Bilateral) (L>R) 04/22/2018  . Chronic elbow pain [facility] Area of Pain) (Bilateral) (R>L) 04/22/2018  . Chronic pain syndrome 04/22/2018  . Pharmacologic therapy 04/22/2018  . Disorder of skeletal system 04/22/2018  . Problems influencing health status 04/22/2018  . Chronic gouty arthropathy without tophi 02/18/2018  . Chronic kidney disease (CKD), stage III (moderate) 02/18/2018  . Encounter for long-term (current) use of high-risk medication 02/18/2018  . Cough 10/03/2016  . Annual physical exam 12/14/2015  . Allergic rhinitis 04/06/2014  . Family history of prostate cancer 06/23/2013  . Hyperlipidemia 06/23/2013  . Depression with anxiety 05/24/2012  . Hypertension 01/30/2011  . Insomnia 01/30/2011    Pura Spice, PT, DPT # [id number], SPT 09/18/2019, 4:40 PM  [facility] [address] [place], [place], [address] Phone: [phone]   Fax:  [phone]  Name: Garth Diffley MRN: [id number] Date of Birth: [birth year]

## 2019-09-24 ENCOUNTER — Other Ambulatory Visit: Payer: Self-pay

## 2019-09-24 ENCOUNTER — Ambulatory Visit: Payer: Managed Care, Other (non HMO) | Admitting: Physical Therapy

## 2019-09-24 ENCOUNTER — Encounter: Payer: Self-pay | Admitting: Physical Therapy

## 2019-09-24 DIAGNOSIS — R269 Unspecified abnormalities of gait and mobility: Secondary | ICD-10-CM

## 2019-09-24 DIAGNOSIS — M2560 Stiffness of unspecified joint, not elsewhere classified: Secondary | ICD-10-CM | POA: Diagnosis not present

## 2019-09-24 DIAGNOSIS — M25571 Pain in right ankle and joints of right foot: Secondary | ICD-10-CM

## 2019-09-24 DIAGNOSIS — M6281 Muscle weakness (generalized): Secondary | ICD-10-CM

## 2019-09-24 NOTE — Therapy (Signed)
[facility] [address]. [place], [place], [address] Phone: [phone]   Fax:  [phone]  Physical Therapy Treatment  Patient Details  Name: Chris Johnson MRN: [id number] Date of Birth: 02-10-1951 Referring Provider (PT): Dr. Jennings Books   Encounter Date: 09/24/2019   PT End of Session - 09/24/19 1440    Visit Number 5    Number of Visits 12    Date for PT Re-Evaluation 10/15/19    Authorization - Visit Number 2    Authorization - Number of Visits 10    PT Start Time 1330    PT Stop Time 1420    PT Time Calculation (min) 50 min    Activity Tolerance Patient tolerated treatment well;Patient limited by pain    Behavior During Therapy [facility] for tasks assessed/performed           Past Medical History:  Diagnosis Date  . Arthritis   . Gout 10/25/2017  . Hyperlipidemia   . Hypertension   . Normal cardiac stress test 1995   cardiolyte    Past Surgical History:  Procedure Laterality Date  . colonoscopy    . VARICOSE VEIN SURGERY      There were no vitals filed for this visit.   Subjective Assessment - 09/24/19 1438    Subjective Pt reports B neuropathy pain rated at 4/10 NPS prior to treatment. Pt also reports B calf pain rated at 4-5/10 NPS. Pt reports increased activity levels of yard work, pool cleaning, and packing car prior to vacation.    Pertinent History Pt. reports >6 months of cramping/ pain in B lower legs/feet.  Pt. has gel inserts in shoes (not orthotics).  Increase pain with car rides/ traveling.  Pt. states he is unable to walk >10 minutes due to increase pain.  Pt. unable to walk a mile.  Weight bearing helps B LE cramping.    Limitations Standing;Walking;Sitting    How long can you walk comfortably? <10 minutes.    Patient Stated Goals Decrease pain in B lower legs/ feet    Currently in Pain? Yes    Pain Score 4     Pain Location Foot    Pain Orientation Right;Left;Other (Comment)    Pain Descriptors  / Indicators Burning;Constant    Pain Type Chronic pain;Neuropathic pain    Pain Onset More than a month ago    Pain Frequency Constant    Multiple Pain Sites Yes          There.ex:   Ambulating on treadmill for 10 min at 2 MPH. No changes in B plantar foot pain.   Pt educated on AAROM for shoulder elevation, isometrics (IR/ER/Abd/Flex), and scapular retractions due to pt c/o shoulder pain.   Manual Therapy: Pt in supine   Hypervolt for 5 min to B plantar surface of feet to improve sensation and to B calves to reduce muscle tension  B hamstring/piriformis/calf stretch: 4x30 sec/leg x5, 30 sec long arc distraction to L knee, with oscillations  B Grade 3 talocrural AP mobs, x2 for 30 sec/ankles    PT Long Term Goals - 09/18/19 1637      PT LONG TERM GOAL #1   Title Pt. will increase FOTO to 54 to improve functional mobility.    Baseline Initial FOTO: 41    Time 4    Period Weeks    Status On-going    Target Date 10/15/19      PT LONG TERM GOAL #2  Title Pt. will report 4/10 B lower leg/ foot pain at worst with daily function.    Baseline 4/10 pain at rest and 10/10 pain at worst.  Wt. bearing helps cramping.    Time 4    Period Weeks    Status Partially Met    Target Date 10/15/19      PT LONG TERM GOAL #3   Title Pt. able to ambulate 10 minutes on level surfaces with no increase c/o B lower leg/ foot pain.    Baseline Pt. limited by walking distance due to pain.    Time 4    Period Weeks    Status On-going    Target Date 10/15/19                 Plan - 09/24/19 1441    Clinical Impression Statement Pt responded well to manual therapy. Prior to treatment pt reported 4/10 NPS neuropathic pain in B plantar surface of feet and 4/10 NPS B calves. After manual therapy pt reports 2/10 NPS in neuropathic pain in plantar surface of feet and 1/10 NPS in B calves.    Stability/Clinical Decision Making Stable/Uncomplicated    Rehab Potential Good    PT Frequency 2x /  week    PT Duration 4 weeks    PT Treatment/Interventions ADLs/Self Care Home Management;Cryotherapy;Moist Heat;Electrical Stimulation;Gait training;Stair training;Functional mobility training;Neuromuscular re-education;Balance training;Therapeutic exercise;Therapeutic activities;Patient/family education;Orthotic Fit/Training;Manual techniques;Taping;Dry needling;Passive range of motion    PT Next Visit Plan Joint mobs. to foot/ ankle.  Use of Hypervolt.   Progress HEP           Patient will benefit from skilled therapeutic intervention in order to improve the following deficits and impairments:  Abnormal gait, Decreased balance, Decreased endurance, Decreased mobility, Difficulty walking, Hypomobility, Improper body mechanics, Decreased range of motion, Decreased activity tolerance, Decreased safety awareness, Decreased strength, Hypermobility, Impaired flexibility, Pain  Visit Diagnosis: Stiffness in joint  Muscle weakness (generalized)  Pain in joints of both feet  Gait difficulty     Problem List Patient Active Problem List   Diagnosis Date Noted  . Cervicalgia 04/16/2019  . DDD (degenerative disc disease), cervical 04/16/2019  . Chronic upper extremity pain (Bilateral) 04/16/2019  . Abnormal MRI, cervical spine 04/16/2019  . Cervical facet hypertrophy (Multilevel) 04/16/2019  . Cervical foraminal stenosis (Severe) (C5-6) (Left) 04/16/2019  . Cervical Neural foraminal stenosis (Multilevel) (Bilateral) (C4-5, C5-6) 04/16/2019  . Osteoarthritis of cervical spine w/ radiculopathy 04/16/2019  . Atherosclerosis of native artery of both lower extremities with intermittent claudication ([place]) 04/16/2019  . 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 TIA (transient ischemic attack) 04/16/2019  . Bilateral leg cramps 04/16/2019  . Coronary artery calcification seen on CT scan 09/09/2018  . Chronic peripheral neuropathic pain 05/20/2018  . Chronic  musculoskeletal pain 05/20/2018  . Polycystic kidney disease 05/06/2018  . Bilateral calf pain 05/06/2018  . HLD (hyperlipidemia) 04/22/2018  . Chronic lower extremity pain (Primary Area of Pain) (Bilateral) (L>R) 04/22/2018  . Chronic ankle pain (Secondary Area of Pain) (Bilateral) (L>R) 04/22/2018  . Chronic elbow pain [facility] Area of Pain) (Bilateral) (R>L) 04/22/2018  . Chronic pain syndrome 04/22/2018  . Pharmacologic therapy 04/22/2018  . Disorder of skeletal system 04/22/2018  . Problems influencing health status 04/22/2018  . Chronic gouty arthropathy without tophi 02/18/2018  . Chronic kidney disease (CKD), stage III (moderate) 02/18/2018  . Encounter for long-term (current) use of high-risk medication 02/18/2018  . Cough 10/03/2016  . Annual physical exam  12/14/2015  . Allergic rhinitis 04/06/2014  . Family history of prostate cancer 06/23/2013  . Hyperlipidemia 06/23/2013  . Depression with anxiety 05/24/2012  . Hypertension 01/30/2011  . Insomnia 01/30/2011   Pura Spice, PT, DPT # [id number], SPT 09/24/2019, 3:35 PM  [facility] [address] [place], [place], [address] Phone: [phone]   Fax:  [phone]  Name: Oseas Detty MRN: [id number] Date of Birth: Aug 19, 1950

## 2019-10-01 ENCOUNTER — Other Ambulatory Visit: Payer: Self-pay | Admitting: Cardiovascular Disease

## 2019-10-01 ENCOUNTER — Encounter: Payer: Managed Care, Other (non HMO) | Admitting: Physical Therapy

## 2019-10-01 NOTE — Telephone Encounter (Signed)
Please schedule overdue 12 month F/U with Dr. Rockey Situ. Thank you!

## 2019-10-01 NOTE — Telephone Encounter (Signed)
Attempted to schedule patient on vacation and would like a call back next week.

## 2019-10-08 ENCOUNTER — Other Ambulatory Visit: Payer: Self-pay

## 2019-10-08 ENCOUNTER — Ambulatory Visit: Payer: Managed Care, Other (non HMO) | Attending: Neurology | Admitting: Physical Therapy

## 2019-10-08 ENCOUNTER — Encounter: Payer: Self-pay | Admitting: Physical Therapy

## 2019-10-08 DIAGNOSIS — M25572 Pain in left ankle and joints of left foot: Secondary | ICD-10-CM | POA: Diagnosis present

## 2019-10-08 DIAGNOSIS — M25571 Pain in right ankle and joints of right foot: Secondary | ICD-10-CM | POA: Insufficient documentation

## 2019-10-08 DIAGNOSIS — M2560 Stiffness of unspecified joint, not elsewhere classified: Secondary | ICD-10-CM | POA: Diagnosis present

## 2019-10-08 DIAGNOSIS — M6281 Muscle weakness (generalized): Secondary | ICD-10-CM | POA: Diagnosis present

## 2019-10-08 DIAGNOSIS — R269 Unspecified abnormalities of gait and mobility: Secondary | ICD-10-CM | POA: Diagnosis present

## 2019-10-08 NOTE — Therapy (Signed)
[facility] [address] [place], [place], [address] Phone: [phone]   Fax:  [phone]  Physical Therapy Treatment  Patient Details  Name: Chris Johnson MRN: [id number] Date of Birth: 1950/07/16 Referring Provider (PT): Dr. Jennings Books   Encounter Date: 10/08/2019   PT End of Session - 10/08/19 0958    Visit Number 6    Number of Visits 12    Date for PT Re-Evaluation 10/15/19    Authorization - Visit Number 3    Authorization - Number of Visits 10    PT Start Time 3212    PT Stop Time 0946    PT Time Calculation (min) 51 min    Equipment Utilized During Treatment Gait belt    Activity Tolerance Patient tolerated treatment well;Patient limited by pain    Behavior During Therapy [facility] for tasks assessed/performed           Past Medical History:  Diagnosis Date  . Arthritis   . Gout 10/25/2017  . Hyperlipidemia   . Hypertension   . Normal cardiac stress test 1995   cardiolyte    Past Surgical History:  Procedure Laterality Date  . colonoscopy    . VARICOSE VEIN SURGERY      There were no vitals filed for this visit.   Subjective Assessment - 10/08/19 0956    Subjective Pt reports no c/o plantar surface pain from polyneuropathy. Pt reports he has been swimming for exercise and has been taking a multivitamin that he states has been helping his LE pain and tightness along with polyneuropathy.    Pertinent History Pt. reports >6 months of cramping/ pain in B lower legs/feet.  Pt. has gel inserts in shoes (not orthotics).  Increase pain with car rides/ traveling.  Pt. states he is unable to walk >10 minutes due to increase pain.  Pt. unable to walk a mile.  Weight bearing helps B LE cramping.    Limitations Standing;Walking;Sitting    How long can you walk comfortably? <10 minutes.    Patient Stated Goals Decrease pain in B lower legs/ feet    Currently in Pain? No/denies    Pain Score 0-No pain    Pain Onset More  than a month ago          There.ex:  B calf stretch: 3x30 sec   B calf raises: 2x20 reps  B Standing lunge stretch to improve CCK dorsiflexion: 2x10, 3-5 sec holds  Neuro Re-Ed:  Resisted walking at [place]: forwards/backwards/side to side (R/L) x5, 40 lbs, focusing on upright posture and correct gait mechanics and challenging dynamic balance  Obstacle course x6: amb over unstable surface (blue mat with ankle weights underneath), forward steps over small hurdles (3) forwards x2 /L side step x2/R side step x2, standing on bosu ball for 30 sec, increasing gait speed  Manual Therapy:  B Hamstring/piriformis stretch: 2x30 sec  AP Grade 3 talocrural joint mobs B: 2x30 sec Hypervolt to B calf/soleus/posterior tib/ and plantar surfaces of feet to improve muscular tension and improve circulation on plantar surface of feet to improve sensation.   PT Long Term Goals - 09/18/19 1637      PT LONG TERM GOAL #1   Title Pt. will increase FOTO to 54 to improve functional mobility.    Baseline Initial FOTO: 41    Time 4    Period Weeks    Status On-going    Target Date 10/15/19      PT  LONG TERM GOAL #2   Title Pt. will report 4/10 B lower leg/ foot pain at worst with daily function.    Baseline 4/10 pain at rest and 10/10 pain at worst.  Wt. bearing helps cramping.    Time 4    Period Weeks    Status Partially Met    Target Date 10/15/19      PT LONG TERM GOAL #3   Title Pt. able to ambulate 10 minutes on level surfaces with no increase c/o B lower leg/ foot pain.    Baseline Pt. limited by walking distance due to pain.    Time 4    Period Weeks    Status On-going    Target Date 10/15/19                 Plan - 10/08/19 0959    Clinical Impression Statement Pt reports improvements in BLE pain. Pt tolerated bouts of dynamic balance walking over uneven surfaces, stepping over hurdles forwards and sideways and balancing on bosu ball. Pt demonstrates minimal Lateral sway to L/R on  bosu ball but able to correct with ankle and hip strategy. Pt performed resisted walking in nautilus with increased difficulty with backwards walking and side stepping to the R. Pt's goals reassessed next session to see if pt meets goals or has further need for therapy.    Stability/Clinical Decision Making Stable/Uncomplicated    Clinical Decision Making Low    Rehab Potential Good    PT Frequency 2x / week    PT Duration 4 weeks    PT Treatment/Interventions ADLs/Self Care Home Management;Cryotherapy;Moist Heat;Electrical Stimulation;Gait training;Stair training;Functional mobility training;Neuromuscular re-education;Balance training;Therapeutic exercise;Therapeutic activities;Patient/family education;Orthotic Fit/Training;Manual techniques;Taping;Dry needling;Passive range of motion    PT Next Visit Plan Joint mobs. to foot/ ankle.  Use of Hypervolt.   Progress HEP           Patient will benefit from skilled therapeutic intervention in order to improve the following deficits and impairments:  Abnormal gait, Decreased balance, Decreased endurance, Decreased mobility, Difficulty walking, Hypomobility, Improper body mechanics, Decreased range of motion, Decreased activity tolerance, Decreased safety awareness, Decreased strength, Hypermobility, Impaired flexibility, Pain  Visit Diagnosis: Stiffness in joint  Muscle weakness (generalized)  Pain in joints of both feet  Gait difficulty     Problem List Patient Active Problem List   Diagnosis Date Noted  . Cervicalgia 04/16/2019  . DDD (degenerative disc disease), cervical 04/16/2019  . Chronic upper extremity pain (Bilateral) 04/16/2019  . Abnormal MRI, cervical spine 04/16/2019  . Cervical facet hypertrophy (Multilevel) 04/16/2019  . Cervical foraminal stenosis (Severe) (C5-6) (Left) 04/16/2019  . Cervical Neural foraminal stenosis (Multilevel) (Bilateral) (C4-5, C5-6) 04/16/2019  . Osteoarthritis of cervical spine w/ radiculopathy  04/16/2019  . 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 TIA (transient ischemic attack) 04/16/2019  . Bilateral leg cramps 04/16/2019  . Coronary artery calcification seen on CT scan 09/09/2018  . Chronic peripheral neuropathic pain 05/20/2018  . Chronic musculoskeletal pain 05/20/2018  . Polycystic kidney disease 05/06/2018  . Bilateral calf pain 05/06/2018  . HLD (hyperlipidemia) 04/22/2018  . Chronic lower extremity pain (Primary Area of Pain) (Bilateral) (L>R) 04/22/2018  . Chronic ankle pain (Secondary Area of Pain) (Bilateral) (L>R) 04/22/2018  . Chronic elbow pain [facility] Area of Pain) (Bilateral) (R>L) 04/22/2018  . Chronic pain syndrome 04/22/2018  . Pharmacologic therapy 04/22/2018  . Disorder of skeletal  system 04/22/2018  . Problems influencing health status 04/22/2018  . Chronic gouty arthropathy without tophi 02/18/2018  . Chronic kidney disease (CKD), stage III (moderate) 02/18/2018  . Encounter for long-term (current) use of high-risk medication 02/18/2018  . Cough 10/03/2016  . Annual physical exam 12/14/2015  . Allergic rhinitis 04/06/2014  . Family history of prostate cancer 06/23/2013  . Hyperlipidemia 06/23/2013  . Depression with anxiety 05/24/2012  . Hypertension 01/30/2011  . Insomnia 01/30/2011   Pura Spice, PT, DPT # [id number] SPT 10/08/2019, 3:27 PM  [facility] [address] [place], [place], [address] Phone: [phone]   Fax:  [phone]  Name: Chris Johnson MRN: [id number] Date of Birth: [birth year]

## 2019-10-09 ENCOUNTER — Other Ambulatory Visit: Payer: Self-pay | Admitting: Cardiovascular Disease

## 2019-10-15 ENCOUNTER — Encounter: Payer: Managed Care, Other (non HMO) | Admitting: Physical Therapy

## 2019-10-16 ENCOUNTER — Other Ambulatory Visit: Payer: Self-pay

## 2019-10-16 ENCOUNTER — Ambulatory Visit: Payer: Managed Care, Other (non HMO) | Admitting: Physical Therapy

## 2019-10-16 ENCOUNTER — Encounter: Payer: Self-pay | Admitting: Physical Therapy

## 2019-10-16 DIAGNOSIS — M6281 Muscle weakness (generalized): Secondary | ICD-10-CM

## 2019-10-16 DIAGNOSIS — M2560 Stiffness of unspecified joint, not elsewhere classified: Secondary | ICD-10-CM

## 2019-10-16 DIAGNOSIS — R269 Unspecified abnormalities of gait and mobility: Secondary | ICD-10-CM

## 2019-10-16 DIAGNOSIS — M25571 Pain in right ankle and joints of right foot: Secondary | ICD-10-CM

## 2019-10-16 NOTE — Therapy (Signed)
[facility] [address] [place], [place], [address] Phone: [phone]   Fax:  [phone]  Physical Therapy Treatment  Patient Details  Name: Chris Johnson MRN: [id number] Date of Birth: [birth year] Referring Provider (PT): Dr. Jennings Books   Encounter Date: 10/16/2019   PT End of Session - 10/16/19 1446    Visit Number 7    Number of Visits 9    Date for PT Re-Evaluation 11/13/19    Authorization - Visit Number 1    Authorization - Number of Visits 10    PT Start Time 1301    PT Stop Time 0940    PT Time Calculation (min) 56 min    Equipment Utilized During Treatment Gait belt    Activity Tolerance Patient tolerated treatment well;Patient limited by pain;No increased pain    Behavior During Therapy WFL for tasks assessed/performed           Past Medical History:  Diagnosis Date  . Arthritis   . Gout 10/25/2017  . Hyperlipidemia   . Hypertension   . Normal cardiac stress test 1995   cardiolyte    Past Surgical History:  Procedure Laterality Date  . colonoscopy    . VARICOSE VEIN SURGERY      There were no vitals filed for this visit.   Subjective Assessment - 10/16/19 1443    Subjective Pt reports improvements in pain levels. Pt wants to continue with PT biweekly becuase he has significant reductions in his BLE pain with manual therapy. Current pain levels are reported at 1-2/10 NPS. Pt reports continuing to swim and walking ~1.5 miles with spouse for exercise.    Pertinent History Pt. reports >6 months of cramping/ pain in B lower legs/feet.  Pt. has gel inserts in shoes (not orthotics).  Increase pain with car rides/ traveling.  Pt. states he is unable to walk >10 minutes due to increase pain.  Pt. unable to walk a mile.  Weight bearing helps B LE cramping.    Limitations Standing;Walking;Sitting    How long can you walk comfortably? <10 minutes.    Patient Stated Goals Decrease pain in B lower legs/ feet     Currently in Pain? Yes    Pain Score 2     Pain Location Foot    Pain Orientation Right;Left;Lower    Pain Descriptors / Indicators Burning;Discomfort    Pain Type Chronic pain;Neuropathic pain    Pain Onset More than a month ago    Multiple Pain Sites No          Goals reassessed during visit for recert. Pt accomplished 2/3 goals. Pt continues to have 5/10 pain NPS in BLE's after active day.   FOTO score: 57 with a target score of 54.   There.ex:   Walking on treadmill for 10 min to assess ability to walk pain free on flat level surface Calf raises: B 2x20 Calf stretch on stairs: 2x30 sec/each foot B lunge stretch to improve BLE CKC DF in feet: 2x10 reps, with 5 sec pauses Pt educated on STS technique to add quad strength to HEP to improve quad strength to reduce L knee buckling during walking.   Manual therapy: Pt in supine  L knee long arc distraction: 2x30 sec L knee AP Grade 2 mobs, 2x30 sec for pain relief  B hamstring/gastroc/piriformis stretch: 2x30 sec  B AP talocrural joint mobs: grade 2 2x30 for pain relief. Grade 3 2x30 sec for improved DF AROM Hypervolt  to B plantar surfaces of feet for temporary pain reduction in feet 2/2 polyneuropathy and STM with hypervolt to B gastrocs to reduce muscular tension, reduce pain, and improve BLE stiffness.   Pain post treatment: 1/10 NPS   PT Long Term Goals - 10/16/19 1454      PT LONG TERM GOAL #1   Title Pt. will increase FOTO to 54 to improve functional mobility.    Baseline Initial FOTO: 41; 7/22: 57    Time 0    Period Weeks    Status Achieved    Target Date 10/16/19      PT LONG TERM GOAL #2   Title Pt. will report 4/10 B lower leg/ foot pain at worst with daily function.    Baseline 4/10 pain at rest and 10/10 pain at worst.  Wt. bearing helps cramping.; 7/22: 5/10 NPS at worst    Time 4    Period Weeks    Status Partially Met    Target Date 11/13/19      PT LONG TERM GOAL #3   Title Pt. able to ambulate 10  minutes on level surfaces with no increase c/o B lower leg/ foot pain.    Baseline Pt. limited by walking distance due to pain.; 7/22: no pain    Time 0    Period Weeks    Status Achieved    Target Date 10/16/19                 Plan - 10/16/19 1447    Clinical Impression Statement Pt Recert today and reassessment of goals. Pt accomplished FOTO goal scoring a 57 with a target of 54 displaying pt's perceived improvements in functional mobility. After a long day of being active, pt reports having his worst B foot pain at 5/10 NPS with a PT goal of 4/10 NPS or less. Although not met, pt reports prior to PT his worst pain score was 8/10 NPS so he has made significant improvements. Pt able to amb for 10 min on level surface on treadmill on level surfaces with no increase in BLE foot pain so this goal was met as well. Plan is to progress PT POC to biweekly since pt is making signifcant improvements in pain levels. Pt educated on STM with tennis ball on B plantar surface of feet and gastroc/soleus complex so pt can benefti from manual techniques at home. Quad strengthening added to program due to recent c/o knee buckling during walks. Pt can continue to benefit from skilled PT treatment to further reduce pain to improve pt's functional mobility.    Stability/Clinical Decision Making Stable/Uncomplicated    Rehab Potential Good    PT Frequency Biweekly    PT Duration 4 weeks    PT Treatment/Interventions ADLs/Self Care Home Management;Cryotherapy;Moist Heat;Electrical Stimulation;Gait training;Stair training;Functional mobility training;Neuromuscular re-education;Balance training;Therapeutic exercise;Therapeutic activities;Patient/family education;Orthotic Fit/Training;Manual techniques;Taping;Dry needling;Passive range of motion    PT Next Visit Plan Joint mobs. to foot/ ankle.  Use of Hypervolt. Manual therapy to L knee. BLE strengthening    PT Home Exercise Plan Added STS           Patient  will benefit from skilled therapeutic intervention in order to improve the following deficits and impairments:  Abnormal gait, Decreased balance, Decreased endurance, Decreased mobility, Difficulty walking, Hypomobility, Improper body mechanics, Decreased range of motion, Decreased activity tolerance, Decreased safety awareness, Decreased strength, Hypermobility, Impaired flexibility, Pain  Visit Diagnosis: Stiffness in joint  Muscle weakness (generalized)  Pain in joints of both  feet  Gait difficulty     Problem List Patient Active Problem List   Diagnosis Date Noted  . Cervicalgia 04/16/2019  . DDD (degenerative disc disease), cervical 04/16/2019  . Chronic upper extremity pain (Bilateral) 04/16/2019  . Abnormal MRI, cervical spine 04/16/2019  . Cervical facet hypertrophy (Multilevel) 04/16/2019  . Cervical foraminal stenosis (Severe) (C5-6) (Left) 04/16/2019  . Cervical Neural foraminal stenosis (Multilevel) (Bilateral) (C4-5, C5-6) 04/16/2019  . Osteoarthritis of cervical spine w/ radiculopathy 04/16/2019  . Atherosclerosis of native artery of both lower extremities with intermittent claudication ([place]) 04/16/2019  . 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 TIA (transient ischemic attack) 04/16/2019  . Bilateral leg cramps 04/16/2019  . Coronary artery calcification seen on CT scan 09/09/2018  . Chronic peripheral neuropathic pain 05/20/2018  . Chronic musculoskeletal pain 05/20/2018  . Polycystic kidney disease 05/06/2018  . Bilateral calf pain 05/06/2018  . HLD (hyperlipidemia) 04/22/2018  . Chronic lower extremity pain (Primary Area of Pain) (Bilateral) (L>R) 04/22/2018  . Chronic ankle pain (Secondary Area of Pain) (Bilateral) (L>R) 04/22/2018  . Chronic elbow pain [facility] Area of Pain) (Bilateral) (R>L) 04/22/2018  . Chronic pain syndrome 04/22/2018  . Pharmacologic therapy 04/22/2018  . Disorder of skeletal system 04/22/2018   . Problems influencing health status 04/22/2018  . Chronic gouty arthropathy without tophi 02/18/2018  . Chronic kidney disease (CKD), stage III (moderate) 02/18/2018  . Encounter for long-term (current) use of high-risk medication 02/18/2018  . Cough 10/03/2016  . Annual physical exam 12/14/2015  . Allergic rhinitis 04/06/2014  . Family history of prostate cancer 06/23/2013  . Hyperlipidemia 06/23/2013  . Depression with anxiety 05/24/2012  . Hypertension 01/30/2011  . Insomnia 01/30/2011   Pura Spice, PT, DPT # [id number] SPT 10/17/2019, 6:51 PM  [facility] [address] [place], [place], [address] Phone: [phone]   Fax:  [phone]  Name: Ji Feldner MRN: [id number] Date of Birth: 1950/09/02

## 2019-10-29 ENCOUNTER — Encounter: Payer: Managed Care, Other (non HMO) | Admitting: Physical Therapy

## 2019-11-09 ENCOUNTER — Other Ambulatory Visit: Payer: Self-pay | Admitting: Cardiovascular Disease

## 2019-11-10 NOTE — Telephone Encounter (Signed)
Patient is scheduled for 8/25 with [facility]. States he will be out of medication before then, would like if medications can be sent in before visit

## 2019-11-10 NOTE — Telephone Encounter (Signed)
Patient will need an appointment for further refills, thanks !

## 2019-11-12 ENCOUNTER — Encounter: Payer: Self-pay | Admitting: Physical Therapy

## 2019-11-12 ENCOUNTER — Other Ambulatory Visit: Payer: Self-pay

## 2019-11-12 ENCOUNTER — Ambulatory Visit: Payer: Managed Care, Other (non HMO) | Attending: Neurology | Admitting: Physical Therapy

## 2019-11-12 DIAGNOSIS — R269 Unspecified abnormalities of gait and mobility: Secondary | ICD-10-CM | POA: Diagnosis present

## 2019-11-12 DIAGNOSIS — M6281 Muscle weakness (generalized): Secondary | ICD-10-CM | POA: Diagnosis present

## 2019-11-12 DIAGNOSIS — M25571 Pain in right ankle and joints of right foot: Secondary | ICD-10-CM | POA: Diagnosis present

## 2019-11-12 DIAGNOSIS — M25572 Pain in left ankle and joints of left foot: Secondary | ICD-10-CM | POA: Diagnosis present

## 2019-11-12 DIAGNOSIS — M2560 Stiffness of unspecified joint, not elsewhere classified: Secondary | ICD-10-CM | POA: Diagnosis present

## 2019-11-12 NOTE — Therapy (Signed)
[facility] [address]. [place], [place], [address] Phone: [phone]   Fax:  [phone]  Physical Therapy Treatment  Patient Details  Name: Chris Johnson MRN: [id number] Date of Birth: 12/04/1950 Referring Provider (PT): Dr. Jennings Books   Encounter Date: 11/12/2019   PT End of Session - 11/12/19 1554    Visit Number 8    Number of Visits 9    Date for PT Re-Evaluation 11/13/19    Authorization - Visit Number 2    Authorization - Number of Visits 10    PT Start Time 0814    PT Stop Time 4818    PT Time Calculation (min) 48 min    Equipment Utilized During Treatment Gait belt    Activity Tolerance Patient tolerated treatment well;Patient limited by pain;No increased pain    Behavior During Therapy WFL for tasks assessed/performed           Past Medical History:  Diagnosis Date  . Arthritis   . Gout 10/25/2017  . Hyperlipidemia   . Hypertension   . Normal cardiac stress test 1995   cardiolyte    Past Surgical History:  Procedure Laterality Date  . colonoscopy    . VARICOSE VEIN SURGERY      There were no vitals filed for this visit.   Subjective Assessment - 11/12/19 1552    Subjective Pt reports he has been "doing well" overall since last visit. Pt states he has had a flare up in he B feet due to weed whacking on a steep hill this morning. Pt at 6-7 pain in B feet NPS.    Pertinent History Pt. reports >6 months of cramping/ pain in B lower legs/feet.  Pt. has gel inserts in shoes (not orthotics).  Increase pain with car rides/ traveling.  Pt. states he is unable to walk >10 minutes due to increase pain.  Pt. unable to walk a mile.  Weight bearing helps B LE cramping.    Limitations Standing;Walking;Sitting    How long can you walk comfortably? <10 minutes.    Patient Stated Goals Decrease pain in B lower legs/ feet    Currently in Pain? Yes    Pain Score 7     Pain Location Foot    Pain Orientation  Right;Left    Pain Descriptors / Indicators Burning;Aching;Discomfort    Pain Type Chronic pain;Neuropathic pain    Pain Onset More than a month ago          Manual therapy:   B hamstring/gastroc/piriformis: 3x30 sec   L knee long arc distraction: 3x30 sec   L knee AP Grade 2 mobs to improve knee pain: 3x30 sec   B Grade 3 talocrural mobs AP to improve DF AROM: 3x30 sec   Hypervolt for 2.5 min/foot plantar surface to improve neuropathic pain.   B ankle distraction: 2x30 sec    PT Long Term Goals - 10/16/19 1454      PT LONG TERM GOAL #1   Title Pt. will increase FOTO to 54 to improve functional mobility.    Baseline Initial FOTO: 41; 7/22: 57    Time 0    Period Weeks    Status Achieved    Target Date 10/16/19      PT LONG TERM GOAL #2   Title Pt. will report 4/10 B lower leg/ foot pain at worst with daily function.    Baseline 4/10 pain at rest and 10/10 pain at worst.  Wt.  bearing helps cramping.; 7/22: 5/10 NPS at worst    Time 4    Period Weeks    Status Partially Met    Target Date 11/13/19      PT LONG TERM GOAL #3   Title Pt. able to ambulate 10 minutes on level surfaces with no increase c/o B lower leg/ foot pain.    Baseline Pt. limited by walking distance due to pain.; 7/22: no pain    Time 0    Period Weeks    Status Achieved    Target Date 10/16/19                 Plan - 11/12/19 1554    Clinical Impression Statement Pt reports B feet pain at 6-7/10 NPS. Pt responded well to session focusing on manual therapy techniques to B hamstring and gastroc stretches, Long arc distaction of L knee and joint mobility in B ankles. Pt reports significant improvement in B pain in feet and reduction in joint stiffness. Pt can continue to benefit from skilled PT treatment to reduce joint stiffness.    Stability/Clinical Decision Making Stable/Uncomplicated    Clinical Decision Making Low    Rehab Potential Good    PT Frequency Biweekly    PT Duration 4 weeks    PT  Treatment/Interventions ADLs/Self Care Home Management;Cryotherapy;Moist Heat;Electrical Stimulation;Gait training;Stair training;Functional mobility training;Neuromuscular re-education;Balance training;Therapeutic exercise;Therapeutic activities;Patient/family education;Orthotic Fit/Training;Manual techniques;Taping;Dry needling;Passive range of motion    PT Next Visit Plan Joint mobs. to foot/ ankle.  Use of Hypervolt. Manual therapy to L knee. BLE strengthening.  [place]           Patient will benefit from skilled therapeutic intervention in order to improve the following deficits and impairments:  Abnormal gait, Decreased balance, Decreased endurance, Decreased mobility, Difficulty walking, Hypomobility, Improper body mechanics, Decreased range of motion, Decreased activity tolerance, Decreased safety awareness, Decreased strength, Hypermobility, Impaired flexibility, Pain  Visit Diagnosis: Stiffness in joint  Muscle weakness (generalized)  Pain in joints of both feet  Gait difficulty     Problem List Patient Active Problem List   Diagnosis Date Noted  . Cervicalgia 04/16/2019  . DDD (degenerative disc disease), cervical 04/16/2019  . Chronic upper extremity pain (Bilateral) 04/16/2019  . Abnormal MRI, cervical spine 04/16/2019  . Cervical facet hypertrophy (Multilevel) 04/16/2019  . Cervical foraminal stenosis (Severe) (C5-6) (Left) 04/16/2019  . Cervical Neural foraminal stenosis (Multilevel) (Bilateral) (C4-5, C5-6) 04/16/2019  . Osteoarthritis of cervical spine w/ radiculopathy 04/16/2019  . Atherosclerosis of native artery of both lower extremities with intermittent claudication ([place]) 04/16/2019  . 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 TIA (transient ischemic attack) 04/16/2019  . Bilateral leg cramps 04/16/2019  . Coronary artery calcification seen on CT scan 09/09/2018  .  Chronic peripheral neuropathic pain 05/20/2018  . Chronic musculoskeletal pain 05/20/2018  . Polycystic kidney disease 05/06/2018  . Bilateral calf pain 05/06/2018  . HLD (hyperlipidemia) 04/22/2018  . Chronic lower extremity pain (Primary Area of Pain) (Bilateral) (L>R) 04/22/2018  . Chronic ankle pain (Secondary Area of Pain) (Bilateral) (L>R) 04/22/2018  . Chronic elbow pain [facility] Area of Pain) (Bilateral) (R>L) 04/22/2018  . Chronic pain syndrome 04/22/2018  . Pharmacologic therapy 04/22/2018  . Disorder of skeletal system 04/22/2018  . Problems influencing health status 04/22/2018  . Chronic gouty arthropathy without tophi 02/18/2018  . Chronic kidney disease (CKD), stage III (moderate) 02/18/2018  . Encounter for  long-term (current) use of high-risk medication 02/18/2018  . Cough 10/03/2016  . Annual physical exam 12/14/2015  . Allergic rhinitis 04/06/2014  . Family history of prostate cancer 06/23/2013  . Hyperlipidemia 06/23/2013  . Depression with anxiety 05/24/2012  . Hypertension 01/30/2011  . Insomnia 01/30/2011   Pura Spice, PT, DPT # [id number], SPT 11/13/2019, 9:53 AM  [facility] [address] [place], [place], [address] Phone: [phone]   Fax:  [phone]  Name: Chris Johnson MRN: [id number] Date of Birth: 05-28-1950

## 2019-11-19 ENCOUNTER — Other Ambulatory Visit: Payer: Self-pay

## 2019-11-19 ENCOUNTER — Ambulatory Visit: Payer: Managed Care, Other (non HMO) | Admitting: Family

## 2019-11-19 ENCOUNTER — Telehealth: Payer: Self-pay | Admitting: Family

## 2019-11-19 ENCOUNTER — Encounter: Payer: Self-pay | Admitting: Family

## 2019-11-19 VITALS — BP 110/70 | HR 66 | Ht 71.0 in | Wt 191.5 lb

## 2019-11-19 DIAGNOSIS — E782 Mixed hyperlipidemia: Secondary | ICD-10-CM | POA: Diagnosis not present

## 2019-11-19 DIAGNOSIS — I10 Essential (primary) hypertension: Secondary | ICD-10-CM | POA: Diagnosis not present

## 2019-11-19 NOTE — Progress Notes (Signed)
Office Visit    Patient Name: Chris Johnson Date of Encounter: 11/19/2019  Primary Care Provider:  [facility] [place] Primary Cardiologist:  Ida Rogue, MD Electrophysiologist:  None   Chief Complaint    Chris Johnson is a 69 y.o. male with a hx of HTN,  chronic pain, hyperlipidemia, gout, coronary calcium on CT scan, mild carotid disease, Parkinson's presents today for follow-up of HTN  Past Medical History    Past Medical History:  Diagnosis Date  . Arthritis   . Gout 10/25/2017  . Hyperlipidemia   . Hypertension   . Normal cardiac stress test 1995   cardiolyte   Past Surgical History:  Procedure Laterality Date  . colonoscopy    . VARICOSE VEIN SURGERY      Allergies  Allergies  Allergen Reactions  . Atorvastatin     Leg pain  . Celexa [Citalopram Hydrobromide]     dizziness    History of Present Illness    Chris Johnson is a 69 y.o. male with a hx of HTN,  chronic pain, hyperlipidemia, gout, coronary calcium on CT scan, mild carotid disease, Parkinson's  last seen 09/09/2018 via telemedicine by Dr. Rockey Situ.  Previous cardiac testing includes CT cardiac scoring in 2018 with calcium score of 176 placing him at [facility] for age and sex matched control.  Carotid Doppler 11/2017 with bilateral 1-39% stenosis.  He had lower extremity duplex 04/2018 with triphasic waveforms throughout.  Noted history of lower extremity edema while on Lyrica which resolved after discontinuation.  He has chronic leg pain.  At last virtual visit he was noted to be on losartan 100, amlodipine 10 mg.  It was recommended at that time to go back on losartan/HCTZ per his preference with plan to wean off amlodipine.  He was started on Zetia 10 mg daily and statin avoided given chronic leg pain.  Presents today with his wife.  Enjoyed staying active in the yard and does all of his own pool maintenance.  Tells me he has not been checking his blood pressure recently as  it was previously well controlled.  Reports intermittent lightheadedness with position changes and we reviewed orthostatic precautions prior mention such as staying hydrated and making position changes slowly.  Tells me this only happens periodically and does not bother him.  We reviewed his low normal blood pressure in clinic today-denies dizziness, near-syncope, syncope, weakness, fatigue.  He is following closely with neurology for his Parkinson's.  Reports no shortness of breath nor dyspnea on exertion. Reports no chest pain, pressure, or tightness. No edema, orthopnea, PND. Reports no palpitations.   EKGs/Labs/Other Studies Reviewed:   The following studies were reviewed today:  EKG:  EKG is  ordered today.  The ekg ordered today demonstrates NSR 66 bpm with no acute ST/T wave changes.  Recent Labs: No results found for requested labs within last 8760 hours.  Recent Lipid Panel    Component Value Date/Time   CHOL 238 (H) 12/14/2015 1459   TRIG 288.0 (H) 12/14/2015 1459   HDL 38.80 (L) 12/14/2015 1459   CHOLHDL 6 12/14/2015 1459   VLDL 57.6 (H) 12/14/2015 1459   LDLCALC 128 (H) 06/23/2013 0927   LDLDIRECT 144.0 12/14/2015 1459    Home Medications   Current Meds  Medication Sig  . allopurinol (ZYLOPRIM) 100 MG tablet Take 200 mg by mouth daily.  Marland Kitchen aspirin EC 81 MG tablet Take 1 tablet (81 mg total) by mouth daily.  . baclofen (LIORESAL) 10  MG tablet Take 2 tablets (20 mg total) by mouth 2 (two) times daily.  . carbidopa-levodopa (SINEMET IR) 25-100 MG tablet Take 1 tablet by mouth 3 (three) times daily. Two tablets in the am, one tablet at noon and 2 tablets in the pm  . colchicine 0.6 MG tablet Take 0.6 mg by mouth 2 (two) times daily as needed.   . ezetimibe (ZETIA) 10 MG tablet Take 1 tablet (10 mg total) by mouth daily. PLEASE KEEP SCHEDULED APPOINTMENT.  Marland Kitchen losartan-hydrochlorothiazide (HYZAAR) 100-25 MG tablet Take 1 tablet by mouth daily. PLEASE KEEP SCHEDULED APPOINTMENT.   . pregabalin (LYRICA) 50 MG capsule Take 1 capsule (50 mg total) by mouth 2 (two) times daily.  . traMADol (ULTRAM) 50 MG tablet Take 50-100 mg by mouth 4 (four) times daily as needed.  . zolpidem (AMBIEN CR) 12.5 MG CR tablet Take 12.5 mg by mouth at bedtime as needed for sleep.    Review of Systems   Review of Systems  Constitutional: Negative for chills, fever and malaise/fatigue.  Cardiovascular: Negative for chest pain, dyspnea on exertion, leg swelling, near-syncope, orthopnea, palpitations and syncope.  Respiratory: Negative for cough, shortness of breath and wheezing.   Gastrointestinal: Negative for nausea and vomiting.  Neurological: Positive for light-headedness. Negative for dizziness and weakness.   All other systems reviewed and are otherwise negative except as noted above.  Physical Exam    VS:  BP 110/70 (BP Location: Left Arm, Patient Position: Sitting, Cuff Size: Normal)   Pulse 66   Ht 5\' 11"  (1.803 m)   Wt 191 lb 8 oz (86.9 kg)   SpO2 97%   BMI 26.71 kg/m  , BMI Body mass index is 26.71 kg/m. GEN: Well nourished, well developed, in no acute distress. HEENT: normal. Neck: Supple, no JVD, carotid bruits, or masses. Cardiac: RRR, no murmurs, rubs, or gallops. No clubbing, cyanosis, edema.  Radials/DP/PT 2+ and equal bilaterally.  Respiratory:  Respirations regular and unlabored, clear to auscultation bilaterally. GI: Soft, nontender, nondistended, BS + x 4. MS: No deformity or atrophy. Skin: Warm and dry, no rash. Neuro:  Strength and sensation are intact. Psych: Normal affect.  Assessment & Plan    1. HTN -BP low normal today though asymptomatic.  He will monitor at home for 1 week and we will have phone call in 1 week to check in.  If BP consistently less than 130/80 will consider reduced dose of his losartan-hydrochlorothiazide-presently on 100-25 mg daily.   2. HLD - Continue Zetia 10 mg daily.  Statin has been deferred due to myalgias.  Requested lab work  from his PCP.  LDL goal less than 100 ideally less than 70. 3. Parkinson's -continue to follow with neurology.  We reviewed that oftentimes individuals with Parkinson's due to autonomic dysfunction begin to have low blood pressures as the disease progresses.  Educated to monitor for lightheadedness, dizziness, near-syncope, syncope and to check blood pressure routinely at home.  Disposition: Phone call in 1 week with Laurann Montana, NP to check in on BP readings. Follow up in 1 year(s) with Dr. Rockey Situ or APP  Loel Dubonnet, NP 11/19/2019, 10:15 AM

## 2019-11-19 NOTE — Patient Instructions (Addendum)
Medication Instructions:  No medication changes today.   We will send refills next week after telephone conversation with Laurann Montana, NP  *If you need a refill on your cardiac medications before your next appointment, please call your pharmacy*   Lab Work: No lab work today. We have requested [facility] to fax your most recent cholesterol panel and will review results with you next Wednesday when Laurann Montana, NP calls to check in on your blood pressure.   Testing/Procedures: Your EKG today showed normal sinus rhythm which is a great result!  Follow-Up: At [facility], you and your health needs are our priority.  As part of our continuing mission to provide you with exceptional heart care, we have created designated Provider Care Teams.  These Care Teams include your primary Cardiologist (physician) and Advanced Practice Providers (APPs -  Physician Assistants and Nurse Practitioners) who all work together to provide you with the care you need, when you need it.  We recommend signing up for the patient portal called "MyChart".  Sign up information is provided on this After Visit Summary.  MyChart is used to connect with patients for Virtual Visits (Telemedicine).  Patients are able to view lab/test results, encounter notes, upcoming appointments, etc.  Non-urgent messages can be sent to your provider as well.   To learn more about what you can do with MyChart, go to [email or web address].    Your next appointment:   1 year(s)  The format for your next appointment:   In Person  Provider:    You may see Ida Rogue, MD or one of the following Advanced Practice Providers on your designated Care Team:    Murray Hodgkins, NP  Christell Faith, PA-C  Laurann Montana, NP  Marrianne Mood, PA-C  Other Instructions  Please check your blood pressure once per day for the next week. Laurann Montana, NP will call you next week to check in on your blood pressure measurements to  see if we want to decrease your blood pressure pill.   Tips to Measure your Blood Pressure Correctly Here's what you can do to ensure a correct reading:  Don't drink a caffeinated beverage or smoke during the 30 minutes before the test.  Sit quietly for five minutes before the test begins.  During the measurement, sit in a chair with your feet on the floor and your arm supported so your elbow is at about heart level.  The inflatable part of the cuff should completely cover at least 80% of your upper arm, and the cuff should be placed on bare skin, not over a shirt.  Don't talk during the measurement.  Have your blood pressure measured twice, with a brief break in between. If the readings are different by 5 points or more, have it done a third time.   Blood pressure categories  Blood pressure category SYSTOLIC (upper number)  DIASTOLIC (lower number)  Normal Less than 120 mm Hg and Less than 80 mm Hg  Elevated 120-129 mm Hg and Less than 80 mm Hg  High blood pressure: Stage 1 hypertension 130-139 mm Hg or 80-89 mm Hg  High blood pressure: Stage 2 hypertension 140 mm Hg or higher or 90 mm Hg or higher  Hypertensive crisis (consult your doctor immediately) Higher than 180 mm Hg and/or Higher than 120 mm Hg  Source: American Heart Association and American Stroke Association. For more on getting your blood pressure under control, buy Controlling Your Blood Pressure, a Special Health Report from [facility]  [employer].   Blood Pressure Log   Date   Time  Blood Pressure  Position  Example: Nov 1 9 AM 124/78 sitting

## 2019-11-19 NOTE — Telephone Encounter (Signed)
error 

## 2019-11-26 ENCOUNTER — Telehealth: Payer: Self-pay | Admitting: Family

## 2019-11-26 ENCOUNTER — Encounter: Payer: Managed Care, Other (non HMO) | Admitting: Physical Therapy

## 2019-11-26 DIAGNOSIS — I10 Essential (primary) hypertension: Secondary | ICD-10-CM

## 2019-11-26 NOTE — Telephone Encounter (Addendum)
Called to check in on blood pressure and discuss lab results faxed from PCP. Left VM to request call back.   Lab work from PCP 06/2019 notable for:  Creatinine 1.28, GFR 58, AST 25, ALT 23  Total cholesterol 192, Triglycerides 158, LDL 113, HDL 41  Renal function fair. Normal liver function. Normal electrolytes. Total cholesterol and HDL at goal. Triglycerides mildly elevated 158 (goal <150) - recommend reduce intake of sweets, sugars, carbohydrates. LDL 113. Goal <100, ideally <70. He has been on Zetia 10mg  daily. Previously intolerant of Crestor and Atorvastatin. Recommend transition to Nexlizet daily and discontinuation of Zetia (as Zetia is in [place], no need to take both). This addition of Bempedoic acid (the active ingredient in Nexlizet) will hopefully allow for reduction of LDL without myalgias associated with statins.   In clinic BP marginally low. He was going to check BP at home and report log. If BP consistently 110-130s/60-80 continue Losartan-HCTZ 100-25mg  daily and provide refill. If BP consistently <110/60, recommend trial of Losartan-HCTZ 50-12.5mg  daily. He will require a refill.  Loel Dubonnet, NP

## 2019-11-28 ENCOUNTER — Encounter: Payer: Self-pay | Admitting: Family

## 2019-11-28 MED ORDER — LOSARTAN POTASSIUM-HCTZ 100-25 MG PO TABS: 1.0000 | ORAL_TABLET | Freq: Every day | ORAL | 1 refills | Status: DC

## 2019-11-28 NOTE — Addendum Note (Signed)
Addended by: Loel Dubonnet on: 11/28/2019 01:43 PM   Modules accepted: Orders

## 2019-11-28 NOTE — Telephone Encounter (Signed)
Chris Johnson reports his blood pressure has been well controlled at home.  Reports no episodes of hypotension, no lightheadedness, no dizziness.  As such, will send refill for losartan-HCTZ 100-25 mg daily.  We reviewed his cholesterol lab work from his primary care provider.  We discussed that his triglycerides are elevated at 158 (above goal of less than 150) and LDL is 113 (above goal of less than 100).  We discussed transitioning to Nexlizet versus lifestyle changes.  He understandably prefers to avoid more medication and was agreeable to making lifestyle changes and reassessment of lipid panel in 6 to 12 months.  Loel Dubonnet, NP

## 2019-12-09 ENCOUNTER — Encounter: Payer: Self-pay | Admitting: Pain Medicine

## 2019-12-09 NOTE — Progress Notes (Signed)
PROVIDER NOTE: Information contained herein reflects review and annotations entered in association with encounter. Interpretation of such information and data should be left to medically-trained personnel. Information provided to patient can be located elsewhere in the medical record under "Patient Instructions". Document created using STT-dictation technology, any transcriptional errors that may result from process are unintentional.    Patient: Chris Johnson  Service Category: E/M  Provider: Gaspar Cola, MD  DOB: 24-Feb-1951  DOS: 12/10/2019  Specialty: Interventional Pain Management  MRN: [id number]  Setting: Ambulatory outpatient  PCP: [facility]  Type: Established Patient    Referring Provider: [facility]  Location: Office  Delivery: Face-to-face     HPI  Reason for encounter: Chris Johnson, a 69 y.o. year old male, is here today for evaluation and management of his Chronic pain of both lower extremities [M79.604, M79.605, G89.29]. Chris Johnson primary complain today is Foot Pain (bilateral) Last encounter: Practice (Visit date not found). My last encounter with him was on Visit date not found. Pertinent problems: Chris Johnson has Chronic gouty arthropathy without tophi; Chronic lower extremity pain (1ry area of Pain) (Bilateral) (L>R); Chronic ankle pain (2ry area of Pain) (Bilateral) (L>R); Chronic elbow pain (3ry area of Pain) (Bilateral) (R>L); Chronic pain syndrome; Bilateral calf pain; Chronic peripheral neuropathic pain; Chronic musculoskeletal pain; Cervicalgia; DDD (degenerative disc disease), cervical; Chronic upper extremity pain (Bilateral); Abnormal MRI, cervical spine; Cervical facet hypertrophy (Multilevel); Cervical foraminal stenosis (Severe) (C5-6) (Left); Cervical Neural foraminal stenosis (Multilevel) (Bilateral) (C4-5, C5-6); Osteoarthritis of cervical spine w/ radiculopathy; Atherosclerosis of native artery of both lower extremities  with intermittent claudication ([place]); 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 Pain Assessment: Severity of Chronic pain is reported as a 4 /10. Location: Foot Left, Right/denies. Onset: More than a month ago. Quality: Burning. Timing: Constant. Modifying factor(s): medication, exercise, messaging. Vitals:  height is _0  (1.803 m) and weight is 190 lb (86.2 kg). His temporal temperature is 97.2 F (36.2 C) (abnormal). His blood pressure is 117/76 and his pulse is 75. His respiration is 17 and oxygen saturation is 97%.   Patient scheduled for follow-up evaluation and medication management where we are evaluating the Lyrica titration.  By now he should be at 50 mg twice daily.  In addition to this, we had him taking baclofen 20 mg twice daily.  Once we have the patient stable on these medications, we will transfer them to their PCP.  Patient initially seen on 04/22/2018.  At this point he has not taken advantage of any of our interventional therapies.  The patient indicates doing well with the current medication regimen. No adverse reactions or side effects reported to the medications.  According to patient, the baclofen has completely eliminated the spasms in the lower extremities and therefore we will need to touch the dose on that one.  We will have him continue that at 20 mg twice daily.  With respect to the burning sensation of the lower extremities, he refers that his legs are doing significantly better than when he first came in.  He no longer has pain all over of the leg, it is only in his feet.  However, he refers that it is bad enough that it is still making it very difficult for him to ambulate.  In view of this, today we have come up with a plan to continue titrating the Lyrica up as tolerated.  He is currently taking 50  mg p.o. twice daily and we will be switching him to 50 mg p.o. 3 times daily for approximately 3  weeks.  After that, then we will bump him up to 100 mg p.o. twice daily and we will continue as needed.  The patient was given some goals as to what were looking for understanding that 100% relief of the pain would be unrealistic and it is likely that we would run into problems with the medications if we attempt to do this.  I gave him on a goal of getting rid of at least 40% of his pain and to be able to at least complete his basic activities of daily living.  We are not prescribing any opioid analgesics.  I believe that his pain is better served with the use of these membrane stabilizers in the antispasticity medication (baclofen).  Today he wanted to learn a little bit about other alternatives such as interventional therapies.  In reviewing his condition, and the fact that it is permanent, I believe that he would be better served by a spinal cord stimulator.  Today we talked about that and I have provided him with some written information regarding the device so that he can go home and google it.  I told him that I would be more than glad to answer any questions once he has read something about the device.  He understood and accepted.  In reviewing the patient's PMP, it indicates that he did have his Lyrica refilled by [facility] and apparently also had some hydrocodone/APAP 5/325 prescribed by Ala Bent, PA.  In addition the patient has been regularly getting zolpidem ER 12.5.  Pharmacotherapy Assessment   Analgesic: None MME/day: 0 mg/day.   Monitoring: [place] PMP: PDMP reviewed during this encounter.       Pharmacotherapy: No side-effects or adverse reactions reported. Compliance: No problems identified. Effectiveness: Clinically acceptable.  Chauncey Fischer, RN  12/10/2019  1:38 PM  Sign when Signing Visit Safety precautions to be maintained throughout the outpatient stay will include: orient to surroundings, keep bed in low position, maintain call bell within reach at all times,  provide assistance with transfer out of bed and ambulation.        UDS:  Summary  Date Value Ref Range Status  04/22/2018 FINAL  Final    Comment:    ==================================================================== [facility] COMP DRUG ANALYSIS,UR ==================================================================== Test                             Result       Flag       Units Drug Present and Declared for Prescription Verification   Zolpidem                       PRESENT      EXPECTED   Zolpidem Acid                  PRESENT      EXPECTED    Zolpidem acid is an expected metabolite of zolpidem. ==================================================================== Test                      Result    Flag   Units      Ref Range   Creatinine              145  mg/dL      >=20 ==================================================================== Declared Medications:  The flagging and interpretation on this report are based on the  following declared medications.  Unexpected results may arise from  inaccuracies in the declared medications.  **Note: The testing scope of this panel does not include small to  moderate amounts of these reported medications:  Zolpidem (Ambien)  **Note: The testing scope of this panel does not include following  reported medications:  Allopurinol (Zyloprim)  Amlodipine Besylate  Colchicine  Losartan (Cozaar)  Rosuvastatin (Crestor) ==================================================================== For clinical consultation, please call [phone]. ====================================================================      ROS  Constitutional: Denies any fever or chills Gastrointestinal: No reported hemesis, hematochezia, vomiting, or acute GI distress Musculoskeletal: Denies any acute onset joint swelling, redness, loss of ROM, or weakness Neurological: No reported episodes of acute onset apraxia, aphasia, dysarthria, agnosia,  amnesia, paralysis, loss of coordination, or loss of consciousness  Medication Review  allopurinol, baclofen, carbidopa-levodopa, colchicine, ezetimibe, losartan-hydrochlorothiazide, pregabalin, traMADol, and zolpidem  History Review  Allergy: Chris Johnson is allergic to atorvastatin and celexa [citalopram hydrobromide]. Drug: Chris Johnson  reports no history of drug use. Alcohol:  reports current alcohol use of about 5.0 standard drinks of alcohol per week. Tobacco:  reports that he has never smoked. He has never used smokeless tobacco. Social: Chris Johnson  reports that he has never smoked. He has never used smokeless tobacco. He reports current alcohol use of about 5.0 standard drinks of alcohol per week. He reports that he does not use drugs. Medical:  has a past medical history of Arthritis, Gout (10/25/2017), Hyperlipidemia, Hypertension, and Normal cardiac stress test (1995). Surgical: Chris Johnson  has a past surgical history that includes Varicose vein surgery and colonoscopy. Family: family history includes Alcohol abuse in his mother; Arthritis in his father; Breast cancer in his sister; Heart attack in his mother; Hypertension in his mother; Prostate cancer in his father; Skin cancer in his mother; Stroke in his mother.  Laboratory Chemistry Profile   Renal Lab Results  Component Value Date   BUN 14 04/22/2018   CREATININE 1.22 04/22/2018   BCR 11 04/22/2018   GFR 63.17 12/14/2015   GFRAA 71 04/22/2018   GFRNONAA 62 04/22/2018     Hepatic Lab Results  Component Value Date   AST 24 04/22/2018   ALT 12 12/14/2015   ALBUMIN 4.6 04/22/2018   ALKPHOS 83 04/22/2018     Electrolytes Lab Results  Component Value Date   NA 142 04/22/2018   K 4.0 04/22/2018   CL 105 04/22/2018   CALCIUM 9.7 04/22/2018   MG 2.2 04/22/2018     Bone Lab Results  Component Value Date   25OHVITD1 31 04/22/2018   25OHVITD2 <1.0 04/22/2018   25OHVITD3 31 04/22/2018     Inflammation (CRP:  Acute Phase) (ESR: Chronic Phase) Lab Results  Component Value Date   CRP <1 04/22/2018   ESRSEDRATE 25 04/22/2018       Note: Above Lab results reviewed.  Recent Imaging Review  CT HEAD WO CONTRAST CLINICAL DATA:  Cognitive impairment.  Parkinson's.  EXAM: CT HEAD WITHOUT CONTRAST  TECHNIQUE: Contiguous axial images were obtained from the base of the skull through the vertex without intravenous contrast.  COMPARISON:  None.  FINDINGS: Brain: No evidence of acute infarction, hemorrhage, hydrocephalus, extra-axial collection or mass lesion/mass effect.  Vascular: Negative for hyperdense vessel  Skull: Negative  Sinuses/Orbits: Paranasal sinuses clear. Bilateral ocular surgery. No orbital mass lesion.  Other: None  IMPRESSION:  Negative CT head  Electronically Signed   By: Franchot Gallo M.D.   On: 08/19/2019 10:50 Note: Reviewed        Physical Exam  General appearance: Well nourished, well developed, and well hydrated. In no apparent acute distress Mental status: Alert, oriented x 3 (person, place, & time)       Respiratory: No evidence of acute respiratory distress Eyes: PERLA Vitals: BP 117/76 (BP Location: Right Arm, Patient Position: Sitting, Cuff Size: Normal)    Pulse 75    Temp (!) 97.2 F (36.2 C) (Temporal)    Resp 17    Ht _0  (1.803 m)    Wt 190 lb (86.2 kg)    SpO2 97%    BMI 26.50 kg/m  BMI: Estimated body mass index is 26.5 kg/m as calculated from the following:   Height as of this encounter: _1  (1.803 m).   Weight as of this encounter: 190 lb (86.2 kg). Ideal: Ideal body weight: 75.3 kg (166 lb 0.1 oz) Adjusted ideal body weight: 79.7 kg (175 lb 9.7 oz)  Assessment   Status Diagnosis  Controlled Controlled Controlled 1. Chronic lower extremity pain (Primary Area of Pain) (Bilateral) (L>R)   2. Bilateral calf pain   3. Bilateral leg cramps   4. Chronic peripheral neuropathic pain   5. Generalized Sensory Polyneuropathy (lower  extremities) (Bilateral)   6. Chronic ankle pain (2ry area of Pain) (Bilateral) (L>R)   7. Chronic elbow pain (3ry area of Pain) (Bilateral) (R>L)   8. Abnormal MRI, cervical spine   9. Atherosclerosis of native artery of both lower extremities with intermittent claudication ([facility])      Updated Problems: Problem  Chronic lower extremity pain (1ry area of Pain) (Bilateral) (L>R)  Chronic ankle pain (2ry area of Pain) (Bilateral) (L>R)  Chronic elbow pain (3ry area of Pain) (Bilateral) (R>L)    Plan of Care  Problem-specific:  No problem-specific Assessment & Plan notes found for this encounter.  Chris Johnson has a current medication list which includes the following long-term medication(s): allopurinol, carbidopa-levodopa, colchicine, ezetimibe, losartan-hydrochlorothiazide, baclofen, and pregabalin.  Pharmacotherapy (Medications Ordered): Meds ordered this encounter  Medications   baclofen (LIORESAL) 10 MG tablet    Sig: Take 2 tablets (20 mg total) by mouth 2 (two) times daily.    Dispense:  120 tablet    Refill:  5    Do not place this medication, or any other prescription from our practice, on "Automatic Refill". Patient may have prescription filled one day early if pharmacy is closed on scheduled refill date.   pregabalin (LYRICA) 50 MG capsule    Sig: Take 1 capsule (50 mg total) by mouth 3 (three) times daily for 28 days, THEN 2 capsules (100 mg total) 2 (two) times daily for 28 days.    Dispense:  196 capsule    Refill:  0    Fill one day early if pharmacy is closed on scheduled refill date. May substitute for generic if available.   Orders:  No orders of the defined types were placed in this encounter.  Follow-up plan:   Return in about 8 weeks (around 02/03/2020) for (VV), (Med Mgmt) for lyrica titration.      Interventional options: Planned follow-up:      Considering:   Diagnostic IA elbow injections Diagnostic IA ankle joint injections    Palliative  PRN treatment(s):   None at this time     Recent Visits No visits were found meeting these  conditions. Showing recent visits within past 90 days and meeting all other requirements Today's Visits Date Type Provider Dept  12/10/19 Telemedicine Milinda Pointer, MD [facility]  Showing today's visits and meeting all other requirements Future Appointments No visits were found meeting these conditions. Showing future appointments within next 90 days and meeting all other requirements  I discussed the assessment and treatment plan with the patient. The patient was provided an opportunity to ask questions and all were answered. The patient agreed with the plan and demonstrated an understanding of the instructions.  Patient advised to call back or seek an in-person evaluation if the symptoms or condition worsens.  Duration of encounter: 35 minutes.  Note by: Gaspar Cola, MD Date: 12/10/2019; Time: 2:28 PM

## 2019-12-10 ENCOUNTER — Ambulatory Visit: Payer: Managed Care, Other (non HMO) | Admitting: Pain Medicine

## 2019-12-10 ENCOUNTER — Encounter: Payer: Self-pay | Admitting: Pain Medicine

## 2019-12-10 ENCOUNTER — Ambulatory Visit: Payer: Managed Care, Other (non HMO) | Attending: Pain Medicine | Admitting: Pain Medicine

## 2019-12-10 ENCOUNTER — Encounter: Payer: Managed Care, Other (non HMO) | Admitting: Physical Therapy

## 2019-12-10 ENCOUNTER — Other Ambulatory Visit: Payer: Self-pay

## 2019-12-10 VITALS — BP 117/76 | HR 75 | Temp 97.2°F | Resp 17 | Ht 71.0 in | Wt 190.0 lb

## 2019-12-10 DIAGNOSIS — M25522 Pain in left elbow: Secondary | ICD-10-CM

## 2019-12-10 DIAGNOSIS — M25521 Pain in right elbow: Secondary | ICD-10-CM

## 2019-12-10 DIAGNOSIS — M79605 Pain in left leg: Secondary | ICD-10-CM

## 2019-12-10 DIAGNOSIS — R252 Cramp and spasm: Secondary | ICD-10-CM

## 2019-12-10 DIAGNOSIS — M792 Neuralgia and neuritis, unspecified: Secondary | ICD-10-CM

## 2019-12-10 DIAGNOSIS — M79662 Pain in left lower leg: Secondary | ICD-10-CM

## 2019-12-10 DIAGNOSIS — I70213 Atherosclerosis of native arteries of extremities with intermittent claudication, bilateral legs: Secondary | ICD-10-CM

## 2019-12-10 DIAGNOSIS — M79604 Pain in right leg: Secondary | ICD-10-CM | POA: Diagnosis not present

## 2019-12-10 DIAGNOSIS — M25571 Pain in right ankle and joints of right foot: Secondary | ICD-10-CM

## 2019-12-10 DIAGNOSIS — M25572 Pain in left ankle and joints of left foot: Secondary | ICD-10-CM

## 2019-12-10 DIAGNOSIS — G8929 Other chronic pain: Secondary | ICD-10-CM

## 2019-12-10 DIAGNOSIS — M79661 Pain in right lower leg: Secondary | ICD-10-CM | POA: Diagnosis not present

## 2019-12-10 DIAGNOSIS — G608 Other hereditary and idiopathic neuropathies: Secondary | ICD-10-CM

## 2019-12-10 DIAGNOSIS — R937 Abnormal findings on diagnostic imaging of other parts of musculoskeletal system: Secondary | ICD-10-CM

## 2019-12-10 MED ORDER — BACLOFEN 10 MG PO TABS: 20.0000 mg | ORAL_TABLET | Freq: Two times a day (BID) | ORAL | 5 refills | Status: DC

## 2019-12-10 MED ORDER — PREGABALIN 50 MG PO CAPS: ORAL_CAPSULE | ORAL | 0 refills | Status: DC

## 2019-12-10 NOTE — Progress Notes (Signed)
Safety precautions to be maintained throughout the outpatient stay will include: orient to surroundings, keep bed in low position, maintain call bell within reach at all times, provide assistance with transfer out of bed and ambulation.  

## 2019-12-10 NOTE — Patient Instructions (Signed)
Spinal Cord Stimulator Implantation  A spinal cord stimulator is a small device that makes electrical signals and sends them through wires (leads) to nerves in the spinal cord. This stimulates the nerves and can help relieve long-term (chronic) pain in the back, legs, or arms. Implantation is a procedure to place this device under the skin. This procedure is done if a temporary spinal cord stimulator effectively reduces your pain during a trial period. A spinal cord stimulator may have an electrical pulse generator, a battery, and leads. It may also come with a small remote that you can control. Stimulators with a rechargeable battery may last up to 10 years. Stimulators with a non-rechargeable battery may last 2-5 years before being replaced. Tell a health care provider about:  Any allergies you have.  All medicines you are taking, including vitamins, herbs, eye drops, creams, and over-the-counter medicines.  Any problems you or family members have had with anesthetic medicines.  Any blood disorders you have.  Any surgeries you have had.  Any medical conditions you have.  Whether you are pregnant or may be pregnant. What are the risks? Generally, this is a safe procedure. However, problems may occur, including:  Infection.  Bleeding.  Allergic reactions to medicines, devices, or dyes.  Damage to the skin where the battery is placed, or damage to nerves, back muscles, or the spinal cord.  The device or battery failing or not working.  A lead moving out of place.  Inability to move (paralysis).  A pocket of clear fluid forming under the skin (seroma).  Spinal fluid leakage.  Numbness.  Inability to control when you urinate or have a bowel movement (incontinence). What happens before the procedure? Staying hydrated Follow instructions from your health care provider about hydration, which may include:  Up to 2 hours before the procedure - you may continue to drink clear  liquids, such as water, clear fruit juice, black coffee, and plain tea. Eating and drinking restrictions Follow instructions from your health care provider about eating and drinking, which may include:  8 hours before the procedure - stop eating heavy meals or foods such as meat, fried foods, or fatty foods.  6 hours before the procedure - stop eating light meals or foods, such as toast or cereal.  6 hours before the procedure - stop drinking milk or drinks that contain milk.  2 hours before the procedure - stop drinking clear liquids. Medicines  Ask your health care provider about: ? Changing or stopping your regular medicines. This is especially important if you are taking diabetes medicines or blood thinners. ? Taking medicines such as aspirin and ibuprofen. These medicines can thin your blood. Do not take these medicines unless your health care provider tells you to take them. ? Taking over-the-counter medicines, vitamins, herbs, and supplements. General instructions  You may be asked to shower with a germ-killing soap.  Do not use any products that contain nicotine or tobacco, such as cigarettes and e-cigarettes. If you need help quitting, ask your health care provider.  You will have blood tests and physical exams.  You may have chest X-rays.  You will have an electrocardiogram (ECG) to check the electrical patterns and rhythms of your heart.  Plan to have someone take you home from the hospital or clinic.  Plan to have a responsible adult care for you for at least 24 hours after you leave the hospital or clinic. This is important.  Ask your health care provider what steps will   be taken to help prevent infection. These may include: ? Removing hair at the surgery site. ? Washing skin with a germ-killing soap. ? Antibiotic medicine. What happens during the procedure?  An IV will be inserted into one of your veins.  You will be given one or more of the following: ? A  medicine to help you relax (sedative). ? A medicine to numb the area (local anesthetic). ? A medicine to make you fall asleep (general anesthetic).  Dye may be injected into your spinal canal (epidural space) to make it easier to see on X-rays. X-rays may be done during the procedure to help implant the stimulator.  A small incision will be made in your back.  A small piece of bone may be removed from your spine to make room for the device.  Leads will be placed near your spinal cord. X-rays may be done to make sure they are in the right place.  The leads will be tested with stimulation, and you will be asked to react to the tests. If you are under general anesthetic, you will be woken up for this part, and then given more medicine to make you fall asleep again.  The leads will all be joined to one lead that connects to the pulse generator (lead wire).  The lead wire will be placed under your skin so that it leads from your spine to your abdomen or buttocks.  A small incision will be made in your abdomen or buttocks.  The pulse generator will be placed in either your abdomen or buttocks and connected to the lead wire. A small pocket of skin will be formed around it.  Your incisions will be closed with stitches (sutures).  Your incisions will be covered with bandages (dressings). The procedure may vary among health care providers and hospitals. What happens after the procedure?  Your blood pressure, heart rate, breathing rate, and blood oxygen level will be monitored until you leave the hospital or clinic.  You will be given pain medicine as needed.  You will have to drink fluids.  Your pulse generator may be programmed.  Do not drive until your health care provider approves.  You may have to stay in the hospital after the procedure. Ask your health care provider how long you will stay. Summary  A spinal cord stimulator sends electrical pulses through the leads to the spinal  cord. This can relieve pain.  A spinal cord stimulator may have an electrical pulse generator, a battery, and a small remote that you can control.  Plan to have a responsible adult care for you for at least 24 hours after you leave the hospital or clinic. This information is not intended to replace advice given to you by your health care provider. Make sure you discuss any questions you have with your health care provider. Document Revised: 10/11/2018 Document Reviewed: 04/26/2017 Elsevier Patient Education  2020 [employer]  

## 2019-12-15 ENCOUNTER — Telehealth: Payer: Managed Care, Other (non HMO) | Admitting: Pain Medicine

## 2019-12-23 ENCOUNTER — Other Ambulatory Visit: Payer: Self-pay | Admitting: Cardiovascular Disease

## 2019-12-23 MED ORDER — EZETIMIBE 10 MG PO TABS: 10.0000 mg | ORAL_TABLET | Freq: Every day | ORAL | 3 refills | Status: DC

## 2019-12-24 ENCOUNTER — Encounter: Payer: Self-pay | Admitting: Dermatology

## 2019-12-24 ENCOUNTER — Ambulatory Visit (INDEPENDENT_AMBULATORY_CARE_PROVIDER_SITE_OTHER): Payer: Managed Care, Other (non HMO) | Admitting: Dermatology

## 2019-12-24 ENCOUNTER — Other Ambulatory Visit: Payer: Self-pay

## 2019-12-24 DIAGNOSIS — L820 Inflamed seborrheic keratosis: Secondary | ICD-10-CM

## 2019-12-24 DIAGNOSIS — L821 Other seborrheic keratosis: Secondary | ICD-10-CM | POA: Diagnosis not present

## 2019-12-24 DIAGNOSIS — L570 Actinic keratosis: Secondary | ICD-10-CM

## 2019-12-24 DIAGNOSIS — L578 Other skin changes due to chronic exposure to nonionizing radiation: Secondary | ICD-10-CM

## 2019-12-24 NOTE — Patient Instructions (Signed)
Cryotherapy Aftercare  . Wash gently with soap and water everyday.   . Apply Vaseline and Band-Aid daily until healed. Recommend daily broad spectrum sunscreen SPF 30+ to sun-exposed areas, reapply every 2 hours as needed. Call for new or changing lesions.  

## 2019-12-24 NOTE — Progress Notes (Signed)
   Follow-Up Visit   Subjective  Chris Johnson is a 69 y.o. male who presents for the following: Actinic Keratosis (Pt presents with pink rough places on his face, past treatment 5Fu/Calcipotriene cream this past January ).  The following portions of the chart were reviewed this encounter and updated as appropriate:  Tobacco  Allergies  Meds  Problems  Med Hx  Surg Hx  Fam Hx     Review of Systems:  No other skin or systemic complaints except as noted in HPI or Assessment and Plan.  Objective  Well appearing patient in no apparent distress; mood and affect are within normal limits.  A focused examination was performed including face, arms . Relevant physical exam findings are noted in the Assessment and Plan.  Objective  Right bicep (2): Erythematous keratotic or waxy stuck-on papule or plaque.   Objective  Head - Anterior (Face) (8): Erythematous thin papules/macules with gritty scale.    Assessment & Plan  Inflamed seborrheic keratosis (2) Right bicep  Destruction of lesion - Right bicep Complexity: simple   Destruction method: cryotherapy   Informed consent: discussed and consent obtained   Timeout:  patient name, date of birth, surgical site, and procedure verified Lesion destroyed using liquid nitrogen: Yes   Region frozen until ice ball extended beyond lesion: Yes   Outcome: patient tolerated procedure well with no complications   Post-procedure details: wound care instructions given    AK (actinic keratosis) (8) Head - Anterior (Face)  Destruction of lesion - Head - Anterior (Face) Complexity: simple   Destruction method: cryotherapy   Informed consent: discussed and consent obtained   Timeout:  patient name, date of birth, surgical site, and procedure verified Lesion destroyed using liquid nitrogen: Yes   Region frozen until ice ball extended beyond lesion: Yes   Outcome: patient tolerated procedure well with no complications   Post-procedure  details: wound care instructions given    Actinic Damage - diffuse scaly erythematous macules with underlying dyspigmentation - Recommend daily broad spectrum sunscreen SPF 30+ to sun-exposed areas, reapply every 2 hours as needed.  - Call for new or changing lesions.  Seborrheic Keratoses - Stuck-on, waxy, tan-brown papules and plaques  - Discussed benign etiology and prognosis. - Observe - Call for any changes  Return in about 6 months (around 06/22/2020) for aks .  IMarye Round, CMA, am acting as scribe for [name], MD .  Documentation: I have reviewed the above documentation for accuracy and completeness, and I agree with the above.  [name], MD

## 2020-01-19 ENCOUNTER — Other Ambulatory Visit: Payer: Self-pay | Admitting: Pain Medicine

## 2020-01-19 DIAGNOSIS — R252 Cramp and spasm: Secondary | ICD-10-CM

## 2020-01-19 DIAGNOSIS — M79661 Pain in right lower leg: Secondary | ICD-10-CM

## 2020-01-19 DIAGNOSIS — M79662 Pain in left lower leg: Secondary | ICD-10-CM

## 2020-01-19 DIAGNOSIS — G8929 Other chronic pain: Secondary | ICD-10-CM

## 2020-01-29 ENCOUNTER — Encounter: Payer: Self-pay | Admitting: Pain Medicine

## 2020-01-29 ENCOUNTER — Other Ambulatory Visit: Payer: Self-pay | Admitting: Cardiovascular Disease

## 2020-01-29 DIAGNOSIS — I10 Essential (primary) hypertension: Secondary | ICD-10-CM

## 2020-01-29 MED ORDER — LOSARTAN POTASSIUM-HCTZ 100-25 MG PO TABS: 1.0000 | ORAL_TABLET | Freq: Every day | ORAL | 1 refills | Status: DC

## 2020-01-29 NOTE — Progress Notes (Signed)
Patient: Chris Johnson  Service Category: E/M  Provider: Gaspar Cola, MD  DOB: 1950-04-09  DOS: 02/02/2020  Location: Office  MRN: [id number]  Setting: Ambulatory outpatient  Referring Provider: [facility]  Type: Established Patient  Specialty: Interventional Pain Management  PCP: [facility] [place]  Location: Remote location  Delivery: TeleHealth     Virtual Encounter - Pain Management PROVIDER NOTE: Information contained herein reflects review and annotations entered in association with encounter. Interpretation of such information and data should be left to medically-trained personnel. Information provided to patient can be located elsewhere in the medical record under "Patient Instructions". Document created using STT-dictation technology, any transcriptional errors that may result from process are unintentional.    Contact & Pharmacy Preferred: [phone] Home: [phone] (home) Mobile: [phone] (mobile) E-mail: [email or web address]  [place], [place] [address] Phone: [phone] Fax: [id number] [place], [place] - [address] [place] [address] Phone: [phone] Fax: [phone]   Pre-screening  Chris Johnson offered "in-person" vs "virtual" encounter. He indicated preferring virtual for this encounter.   Reason COVID-19*  Social distancing based on CDC and AMA recommendations.   I contacted Chris Johnson on 02/02/2020 via telephone.      I clearly identified myself as Gaspar Cola, MD. I verified that I was speaking with the correct person using two identifiers (Name: Chris Johnson, and date of birth: 12-19-1950).  Consent I sought verbal advanced consent from Chris Johnson for virtual visit interactions. I informed Chris Johnson of possible security and privacy  concerns, risks, and limitations associated with providing "not-in-person" medical evaluation and management services. I also informed Chris Johnson of the availability of "in-person" appointments. Finally, I informed him that there would be a charge for the virtual visit and that he could be  personally, fully or partially, financially responsible for it. Chris Johnson expressed understanding and agreed to proceed.   Historic Elements   Chris Johnson is a 69 y.o. year old, male patient evaluated today after our last contact on 01/19/2020. Chris Johnson  has a past medical history of Arthritis, Gout (10/25/2017), Hyperlipidemia, Hypertension, and Normal cardiac stress test (1995). He also  has a past surgical history that includes Varicose vein surgery and colonoscopy. Chris Johnson has a current medication list which includes the following prescription(s): allopurinol, baclofen, carbidopa-levodopa, colchicine, cyanocobalamin, ezetimibe, losartan-hydrochlorothiazide, pregabalin, tramadol, and zolpidem. He  reports that he has never smoked. He has never used smokeless tobacco. He reports current alcohol use of about 5.0 standard drinks of alcohol per week. He reports that he does not use drugs. Chris Johnson is allergic to atorvastatin and celexa [citalopram hydrobromide].   HPI  Today, he is being contacted for medication management.  Today I reviewed the patient's use of the baclofen and Lyrica.  He describes that he is no longer having any type of cramping and therefore he believes that the baclofen is working really good at the current regimen of 20 mg twice daily.  In the case of the Lyrica, he refers that he has not control the pain at the current dose, but he also denies any type of side effects.  At the time of this call he was taking Lyrica 100 mg in the morning and 50 mg twice daily.  Today we talked about the possibility of slowly increasing it to  100 mg 3 times daily and today I will be providing him  with a prescription for enough medication to continue doing that increase.  He was instructed to increase it by 1 pill every 14 days, as tolerated.  Once we get to the 100 mg 3 times daily, we will see how he is doing again and if he still not getting adequate relief of the pain but he is not having any side effects, then we will continue to titrate up to a maximum of 150 mg 3 times daily.  Today he communicated to me that he is interested in following up with a possible spinal cord stimulator trial.  I have explained to him that the first step will be to send him to have the medical psychology evaluation and if he does not have any contraindications to the procedure then we will do a trial.  If the trial is successful in controlling his pain, then we will move onto a permanent implant.  If on the other hand he does not get any benefit, then we will need to look into other options such as an intrathecal pump.  Pharmacotherapy Assessment  Analgesic: None MME/day: 0 mg/day.   Monitoring: [place] PMP: PDMP reviewed during this encounter.       Pharmacotherapy: No side-effects or adverse reactions reported. Compliance: No problems identified. Effectiveness: Clinically acceptable. Plan: Refer to "POC".  UDS:  Summary  Date Value Ref Range Status  04/22/2018 FINAL  Final    Comment:    ==================================================================== [facility] COMP DRUG ANALYSIS,UR ==================================================================== Test                             Result       Flag       Units Drug Present and Declared for Prescription Verification   Zolpidem                       PRESENT      EXPECTED   Zolpidem Acid                  PRESENT      EXPECTED    Zolpidem acid is an expected metabolite of zolpidem. ==================================================================== Test                      Result    Flag   Units      Ref Range   Creatinine              145               mg/dL      >=20 ==================================================================== Declared Medications:  The flagging and interpretation on this report are based on the  following declared medications.  Unexpected results may arise from  inaccuracies in the declared medications.  **Note: The testing scope of this panel does not include small to  moderate amounts of these reported medications:  Zolpidem (Ambien)  **Note: The testing scope of this panel does not include following  reported medications:  Allopurinol (Zyloprim)  Amlodipine Besylate  Colchicine  Losartan (Cozaar)  Rosuvastatin (Crestor) ==================================================================== For clinical consultation, please call [phone]. ====================================================================     Laboratory Chemistry Profile   Renal Lab Results  Component Value Date   BUN 14 04/22/2018   CREATININE 1.22 04/22/2018   BCR 11 04/22/2018   GFR 63.17 12/14/2015   GFRAA 71 04/22/2018   GFRNONAA  62 04/22/2018     Hepatic Lab Results  Component Value Date   AST 24 04/22/2018   ALT 12 12/14/2015   ALBUMIN 4.6 04/22/2018   ALKPHOS 83 04/22/2018     Electrolytes Lab Results  Component Value Date   NA 142 04/22/2018   K 4.0 04/22/2018   CL 105 04/22/2018   CALCIUM 9.7 04/22/2018   MG 2.2 04/22/2018     Bone Lab Results  Component Value Date   25OHVITD1 31 04/22/2018   25OHVITD2 <1.0 04/22/2018   25OHVITD3 31 04/22/2018     Inflammation (CRP: Acute Phase) (ESR: Chronic Phase) Lab Results  Component Value Date   CRP <1 04/22/2018   ESRSEDRATE 25 04/22/2018       Note: Above Lab results reviewed.  Imaging  CT HEAD WO CONTRAST CLINICAL DATA:  Cognitive impairment.  Parkinson's.  EXAM: CT HEAD WITHOUT CONTRAST  TECHNIQUE: Contiguous axial images were obtained from the base of the skull through the vertex without intravenous contrast.  COMPARISON:   None.  FINDINGS: Brain: No evidence of acute infarction, hemorrhage, hydrocephalus, extra-axial collection or mass lesion/mass effect.  Vascular: Negative for hyperdense vessel  Skull: Negative  Sinuses/Orbits: Paranasal sinuses clear. Bilateral ocular surgery. No orbital mass lesion.  Other: None  IMPRESSION: Negative CT head  Electronically Signed   By: Franchot Gallo M.D.   On: 08/19/2019 10:50  Assessment  The primary encounter diagnosis was Chronic pain syndrome. 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  Plan of Care  Problem-specific:  No problem-specific Assessment & Plan notes found for this encounter.  Chris Johnson has a current medication list which includes the following long-term medication(s): allopurinol, baclofen, carbidopa-levodopa, colchicine, ezetimibe, losartan-hydrochlorothiazide, and pregabalin.  Pharmacotherapy (Medications Ordered): Meds ordered this encounter  Medications  . pregabalin (LYRICA) 50 MG capsule    Sig: Take 1-2 capsules (50-100 mg total) by mouth 3 (three) times daily. Continue to titrate dose up as instructed.    Dispense:  180 capsule    Refill:  1    Fill one day early if pharmacy is closed on scheduled refill date. May substitute for generic if available.  . baclofen (LIORESAL) 10 MG tablet    Sig: Take 2 tablets (20 mg total) by mouth 2 (two) times daily.    Dispense:  120 tablet    Refill:  2    Do not place this medication, or any other prescription from  our practice, on "Automatic Refill". Patient may have prescription filled one day early if pharmacy is closed on scheduled refill date.   Orders:  Orders Placed This Encounter  Procedures  . Ambulatory referral to Psychology    Referral Priority:   Routine    Referral Type:   Psychiatric    Referral Reason:   Specialty Services Required    Requested Specialty:   Psychology    Number of Visits Requested:   1   Follow-up plan:   Return in about 6 weeks (around 03/15/2020) for (F2F), (Med Mgmt).      Interventional options: Planned follow-up:   Medical psychology evaluation to determine if the patient is  a good candidate for a spinal cord stimulator to control the lower extremity pain.   Considering:   Diagnostic IA elbow injections Diagnostic IA ankle joint injections    Palliative PRN treatment(s):   None at this time    Recent Visits Date Type Provider Dept  12/10/19 Telemedicine Milinda Pointer, MD [facility]  Showing recent visits within past 90 days and meeting all other requirements Today's Visits Date Type Provider Dept  02/02/20 Telemedicine Milinda Pointer, MD [facility]  Showing today's visits and meeting all other requirements Future Appointments No visits were found meeting these conditions. Showing future appointments within next 90 days and meeting all other requirements  I discussed the assessment and treatment plan with the patient. The patient was provided an opportunity to ask questions and all were answered. The patient agreed with the plan and demonstrated an understanding of the instructions.  Patient advised to call back or seek an in-person evaluation if the symptoms or condition worsens.  Duration of encounter: 18 minutes.  Note by: Gaspar Cola, MD Date: 02/02/2020; Time: 5:12 PM

## 2020-02-02 ENCOUNTER — Ambulatory Visit: Payer: Medicare Other | Attending: Pain Medicine | Admitting: Pain Medicine

## 2020-02-02 ENCOUNTER — Other Ambulatory Visit: Payer: Self-pay

## 2020-02-02 DIAGNOSIS — M792 Neuralgia and neuritis, unspecified: Secondary | ICD-10-CM

## 2020-02-02 DIAGNOSIS — M25572 Pain in left ankle and joints of left foot: Secondary | ICD-10-CM

## 2020-02-02 DIAGNOSIS — M25571 Pain in right ankle and joints of right foot: Secondary | ICD-10-CM

## 2020-02-02 DIAGNOSIS — M79661 Pain in right lower leg: Secondary | ICD-10-CM

## 2020-02-02 DIAGNOSIS — G8929 Other chronic pain: Secondary | ICD-10-CM

## 2020-02-02 DIAGNOSIS — M79605 Pain in left leg: Secondary | ICD-10-CM

## 2020-02-02 DIAGNOSIS — R252 Cramp and spasm: Secondary | ICD-10-CM

## 2020-02-02 DIAGNOSIS — M25522 Pain in left elbow: Secondary | ICD-10-CM

## 2020-02-02 DIAGNOSIS — Z79899 Other long term (current) drug therapy: Secondary | ICD-10-CM

## 2020-02-02 DIAGNOSIS — M25521 Pain in right elbow: Secondary | ICD-10-CM | POA: Diagnosis not present

## 2020-02-02 DIAGNOSIS — M79602 Pain in left arm: Secondary | ICD-10-CM

## 2020-02-02 DIAGNOSIS — G894 Chronic pain syndrome: Secondary | ICD-10-CM

## 2020-02-02 DIAGNOSIS — M79662 Pain in left lower leg: Secondary | ICD-10-CM

## 2020-02-02 DIAGNOSIS — M79604 Pain in right leg: Secondary | ICD-10-CM

## 2020-02-02 DIAGNOSIS — M79601 Pain in right arm: Secondary | ICD-10-CM

## 2020-02-02 DIAGNOSIS — G608 Other hereditary and idiopathic neuropathies: Secondary | ICD-10-CM

## 2020-02-02 MED ORDER — PREGABALIN 50 MG PO CAPS: 50.0000 mg | ORAL_CAPSULE | Freq: Three times a day (TID) | ORAL | 1 refills | Status: DC

## 2020-02-02 MED ORDER — BACLOFEN 10 MG PO TABS: 20.0000 mg | ORAL_TABLET | Freq: Two times a day (BID) | ORAL | 2 refills | Status: DC

## 2020-03-07 NOTE — Progress Notes (Deleted)
No show

## 2020-03-10 ENCOUNTER — Encounter: Payer: Medicare Other | Admitting: Pain Medicine

## 2020-03-10 ENCOUNTER — Other Ambulatory Visit: Payer: Self-pay | Admitting: Pain Medicine

## 2020-03-10 DIAGNOSIS — R252 Cramp and spasm: Secondary | ICD-10-CM

## 2020-03-10 DIAGNOSIS — G8929 Other chronic pain: Secondary | ICD-10-CM

## 2020-03-10 DIAGNOSIS — G608 Other hereditary and idiopathic neuropathies: Secondary | ICD-10-CM

## 2020-03-10 DIAGNOSIS — M79605 Pain in left leg: Secondary | ICD-10-CM

## 2020-03-10 DIAGNOSIS — M79662 Pain in left lower leg: Secondary | ICD-10-CM

## 2020-03-10 DIAGNOSIS — M79661 Pain in right lower leg: Secondary | ICD-10-CM

## 2020-03-10 MED ORDER — PREGABALIN 50 MG PO CAPS: 50.0000 mg | ORAL_CAPSULE | Freq: Three times a day (TID) | ORAL | 2 refills | Status: AC

## 2020-03-10 MED ORDER — BACLOFEN 10 MG PO TABS: 20.0000 mg | ORAL_TABLET | Freq: Two times a day (BID) | ORAL | 2 refills | Status: AC

## 2020-03-14 NOTE — Progress Notes (Signed)
PROVIDER NOTE: Information contained herein reflects review and annotations entered in association with encounter. Interpretation of such information and data should be left to medically-trained personnel. Information provided to patient can be located elsewhere in the medical record under "Patient Instructions". Document created using STT-dictation technology, any transcriptional errors that may result from process are unintentional.    Patient: Chris Johnson  Service Category: E/M  Provider: Gaspar Cola, MD  DOB: September 09, 1950  DOS: 03/15/2020  Specialty: Interventional Pain Management  MRN: [id number]  Setting: Ambulatory outpatient  PCP: [facility] [place]  Type: Established Patient    Referring Provider: [facility]  Location: Office  Delivery: Face-to-face     HPI  Mr. Raford Brissett, a 69 y.o. year old male, is here today because of his Chronic pain syndrome [G89.4]. Mr. Clyne primary complain today is Knee Pain Last encounter: My last encounter with him was on 03/10/2020. Pertinent problems: Mr. Lynch has Chronic gouty arthropathy without tophi; Chronic lower extremity pain (1ry area of Pain) (Bilateral) (L>R); Chronic ankle pain (2ry area of Pain) (Bilateral) (L>R); Chronic elbow pain (3ry area of Pain) (Bilateral) (R>L); Chronic pain syndrome; Bilateral calf pain; Chronic peripheral neuropathic pain; Chronic musculoskeletal pain; Cervicalgia; DDD (degenerative disc disease), cervical; Chronic upper extremity pain (Bilateral); Abnormal MRI, cervical spine; Cervical facet hypertrophy (Multilevel); Cervical foraminal stenosis (Severe) (C5-6) (Left); Cervical Neural foraminal stenosis (Multilevel) (Bilateral) (C4-5, C5-6); Osteoarthritis of cervical spine w/ radiculopathy; Atherosclerosis of native artery of both lower extremities with intermittent claudication ([place]); 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 Pain Assessment: Severity of Chronic pain is reported as a 4 /10. Location: Knee Right,Left/pain radiaites down to his toes. Onset: More than a month ago. Quality: Burning,Tingling,Squeezing. Timing: Constant. Modifying factor(s): meds. Vitals:  height is 6' (1.829 m) and weight is 182 lb (82.6 kg). His temperature is 97.9 F (36.6 C). His blood pressure is 126/83 and his pulse is 64. His oxygen saturation is 98%.   Reason for encounter: medication management.  The patient indicates doing well with the current medication regimen. No adverse reactions or side effects reported to the medications.  Today we checked the report on Dr. Lyman Speller (medical psychologist).  The report indicates that there appears to be no contraindications to the bilateral lumbar spinal cord stimulator trial.  Today I spent quite a bit of time with the patient talked about the trial.  He also had some questions about medications and he indicates that he is getting a little forgetful.  He thinks that this is secondary to the medicines and therefore I have given him the okay to try going down on some of these medicines, 1 at a time so that he can determine whether or not they are in fact causing any problems with his memory.  Somehow, he was under the impression that the spinal cord stimulator trial was done using external electrodes.  Once I informed him that it is done by accessing the epidural space inside of the spine, he began to pedal back.  He indicates that he wants to think about it further.  He also asked about other interventional therapies such as nerve blocks.  However, this patient has been with [place] for quite some time and he has not taken advantage of any interventional therapies and I really doubt that he would.  He is currently doing okay with his medication regiment and I rather have him stay  on it and not mess with it since it is  working.  Today I have reminded the patient that we are transferring the nonopioids to the PCP [facility]).  RTCB: PRN Nonopioids transferred 03/10/2020: Lyrica and baclofen (the patient had an appointment scheduled for medication management on 03/10/2020, which apparently was rescheduled for today).  Next refill on those medicines would be on 06/08/2020.  Pharmacotherapy Assessment   Analgesic: None MME/day: 0 mg/day.   Monitoring: [place] PMP: PDMP reviewed during this encounter.       Pharmacotherapy: No side-effects or adverse reactions reported. Compliance: No problems identified. Effectiveness: Clinically acceptable.  Chauncey Fischer, RN  03/15/2020  2:57 PM  Sign when Signing Visit Nursing Pain Medication Assessment:  Safety precautions to be maintained throughout the outpatient stay will include: orient to surroundings, keep bed in low position, maintain call bell within reach at all times, provide assistance with transfer out of bed and ambulation.  Medication Inspection Compliance: Mr. Nunziato did not comply with our request to bring his pills to be counted. He was reminded that bringing the medication bottles, even when empty, is a requirement.  Medication: None brought in. Pill/Patch Count: None available to be counted. Bottle Appearance: No container available. Did not bring bottle(s) to appointment. Filled Date: N/A Last Medication intake:  TodaySafety precautions to be maintained throughout the outpatient stay will include: orient to surroundings, keep bed in low position, maintain call bell within reach at all times, provide assistance with transfer out of bed and ambulation.     UDS:  Summary  Date Value Ref Range Status  04/22/2018 FINAL  Final    Comment:    ==================================================================== [facility]RE COMP DRUG ANALYSIS,UR ==================================================================== Test                              Result       Flag       Units Drug Present and Declared for Prescription Verification   Zolpidem                       PRESENT      EXPECTED   Zolpidem Acid                  PRESENT      EXPECTED    Zolpidem acid is an expected metabolite of zolpidem. ==================================================================== Test                      Result    Flag   Units      Ref Range   Creatinine              145              mg/dL      >=20 ==================================================================== Declared Medications:  The flagging and interpretation on this report are based on the  following declared medications.  Unexpected results may arise from  inaccuracies in the declared medications.  **Note: The testing scope of this panel does not include small to  moderate amounts of these reported medications:  Zolpidem (Ambien)  **Note: The testing scope of this panel does not include following  reported medications:  Allopurinol (Zyloprim)  Amlodipine Besylate  Colchicine  Losartan (Cozaar)  Rosuvastatin (Crestor) ==================================================================== For clinical consultation, please call [phone]. ====================================================================      ROS  Constitutional: Denies any fever or chills Gastrointestinal: No reported hemesis,  hematochezia, vomiting, or acute GI distress Musculoskeletal: Denies any acute onset joint swelling, redness, loss of ROM, or weakness Neurological: No reported episodes of acute onset apraxia, aphasia, dysarthria, agnosia, amnesia, paralysis, loss of coordination, or loss of consciousness  Medication Review  allopurinol, baclofen, carbidopa-levodopa, colchicine, cyanocobalamin, ezetimibe, losartan-hydrochlorothiazide, pregabalin, traMADol, and zolpidem  History Review  Allergy: Mr. Biven is allergic to atorvastatin and celexa [citalopram hydrobromide]. Drug: Mr. Takeshita   reports no history of drug use. Alcohol:  reports current alcohol use of about 5.0 standard drinks of alcohol per week. Tobacco:  reports that he has never smoked. He has never used smokeless tobacco. Social: Mr. Scarantino  reports that he has never smoked. He has never used smokeless tobacco. He reports current alcohol use of about 5.0 standard drinks of alcohol per week. He reports that he does not use drugs. Medical:  has a past medical history of Arthritis, Gout (10/25/2017), Hyperlipidemia, Hypertension, and Normal cardiac stress test (1995). Surgical: Mr. Karaffa  has a past surgical history that includes Varicose vein surgery and colonoscopy. Family: family history includes Alcohol abuse in his mother; Arthritis in his father; Breast cancer in his sister; Heart attack in his mother; Hypertension in his mother; Prostate cancer in his father; Skin cancer in his mother; Stroke in his mother.  Laboratory Chemistry Profile   Renal Lab Results  Component Value Date   BUN 14 04/22/2018   CREATININE 1.22 04/22/2018   BCR 11 04/22/2018   GFR 63.17 12/14/2015   GFRAA 71 04/22/2018   GFRNONAA 62 04/22/2018     Hepatic Lab Results  Component Value Date   AST 24 04/22/2018   ALT 12 12/14/2015   ALBUMIN 4.6 04/22/2018   ALKPHOS 83 04/22/2018     Electrolytes Lab Results  Component Value Date   NA 142 04/22/2018   K 4.0 04/22/2018   CL 105 04/22/2018   CALCIUM 9.7 04/22/2018   MG 2.2 04/22/2018     Bone Lab Results  Component Value Date   25OHVITD1 31 04/22/2018   25OHVITD2 <1.0 04/22/2018   25OHVITD3 31 04/22/2018     Inflammation (CRP: Acute Phase) (ESR: Chronic Phase) Lab Results  Component Value Date   CRP <1 04/22/2018   ESRSEDRATE 25 04/22/2018       Note: Above Lab results reviewed.  Recent Imaging Review  CT HEAD WO CONTRAST CLINICAL DATA:  Cognitive impairment.  Parkinson's.  EXAM: CT HEAD WITHOUT CONTRAST  TECHNIQUE: Contiguous axial images were obtained  from the base of the skull through the vertex without intravenous contrast.  COMPARISON:  None.  FINDINGS: Brain: No evidence of acute infarction, hemorrhage, hydrocephalus, extra-axial collection or mass lesion/mass effect.  Vascular: Negative for hyperdense vessel  Skull: Negative  Sinuses/Orbits: Paranasal sinuses clear. Bilateral ocular surgery. No orbital mass lesion.  Other: None  IMPRESSION: Negative CT head  Electronically Signed   By: Franchot Gallo M.D.   On: 08/19/2019 10:50 Note: Reviewed        Physical Exam  General appearance: Well nourished, well developed, and well hydrated. In no apparent acute distress Mental status: Alert, oriented x 3 (person, place, & time)       Respiratory: No evidence of acute respiratory distress Eyes: PERLA Vitals: BP 126/83   Pulse 64   Temp 97.9 F (36.6 C)   Ht 6' (1.829 m)   Wt 182 lb (82.6 kg)   SpO2 98%   BMI 24.68 kg/m  BMI: Estimated body mass index is 24.68 kg/m as  calculated from the following:   Height as of this encounter: 6' (1.829 m).   Weight as of this encounter: 182 lb (82.6 kg). Ideal: Ideal body weight: 77.6 kg (171 lb 1.2 oz) Adjusted ideal body weight: 79.6 kg (175 lb 7.1 oz)  Assessment   Status Diagnosis  Controlled Controlled Controlled 1. Chronic pain syndrome   2. Chronic lower extremity pain (1ry area of Pain) (Bilateral) (L>R)   3. Chronic peripheral neuropathic pain   4. Chronic ankle pain (2ry area of Pain) (Bilateral) (L>R)   5. Chronic elbow pain (3ry area of Pain) (Bilateral) (R>L)   6. Cervicalgia      Updated Problems: Problem  Primary Parkinsonism ([facility])  Sensorimotor Neuropathy  Gout  Cad (Coronary Artery Disease)  Other Specified Anxiety Disorders   Formatting of this note might be different from the original. Last Assessment & Plan:  Formatting of this note might be different from the original. Worsening symptoms of anxiety and depressed mood. No improvement with  Paxil. Failure with multiple SSRIs in the past. Given that main component is described as anxiety, will try using Buspar. We also discussed benzodiazepines but opted to hold off on these medications given that he travels frequently. Followup in 2-4 weeks.     Plan of Care  Problem-specific:  No problem-specific Assessment & Plan notes found for this encounter.  Mr. Shloma Roggenkamp has a current medication list which includes the following long-term medication(s): allopurinol, baclofen, carbidopa-levodopa, colchicine, ezetimibe, losartan-hydrochlorothiazide, and pregabalin.  Pharmacotherapy (Medications Ordered): No orders of the defined types were placed in this encounter.  Orders:  No orders of the defined types were placed in this encounter.  Follow-up plan:   Return if symptoms worsen or fail to improve, for PRN; (B) SCS Trial.      Interventional options: Planned follow-up:   Adequate candidate for a bilateral lumbar spinal cord stimulator trial, based on medical psychology conducted by Dr. Lyman Speller.  Today 03/15/2020 the patient was informed that we could proceed with a trial, but after having explained to him what it consisted of he has decided to think about it further.  For some unknown reason he was under the impression that the trial was conducted by placing external electrodes instead of the actual trial electrodes inside of the spine.  This patient has quite a bit of anxiety associated with any type of procedure and although I have gone through the motions with all of this, I sincerely do not think that he really wants to have interventional therapies.   Considering:   Diagnostic IA elbow injections Diagnostic IA ankle joint injections    Palliative PRN treatment(s):   None at this time    Recent Visits Date Type Provider Dept  02/02/20 Telemedicine Milinda Pointer, MD [facility]  Showing recent visits within past 90 days and meeting all other  requirements Today's Visits Date Type Provider Dept  03/15/20 Office Visit Milinda Pointer, MD [facility]  Showing today's visits and meeting all other requirements Future Appointments No visits were found meeting these conditions. Showing future appointments within next 90 days and meeting all other requirements  I discussed the assessment and treatment plan with the patient. The patient was provided an opportunity to ask questions and all were answered. The patient agreed with the plan and demonstrated an understanding of the instructions.  Patient advised to call back or seek an in-person evaluation if the symptoms or condition worsens.  Duration of encounter: 30 minutes.  Note by:  Gaspar Cola, MD Date: 03/15/2020; Time: 3:11 PM

## 2020-03-15 ENCOUNTER — Encounter: Payer: Self-pay | Admitting: Pain Medicine

## 2020-03-15 ENCOUNTER — Other Ambulatory Visit: Payer: Self-pay

## 2020-03-15 ENCOUNTER — Ambulatory Visit: Payer: Medicare Other | Attending: Pain Medicine | Admitting: Pain Medicine

## 2020-03-15 VITALS — BP 126/83 | HR 64 | Temp 97.9°F | Ht 72.0 in | Wt 182.0 lb

## 2020-03-15 DIAGNOSIS — G8929 Other chronic pain: Secondary | ICD-10-CM

## 2020-03-15 DIAGNOSIS — M79605 Pain in left leg: Secondary | ICD-10-CM | POA: Diagnosis present

## 2020-03-15 DIAGNOSIS — M25571 Pain in right ankle and joints of right foot: Secondary | ICD-10-CM | POA: Diagnosis present

## 2020-03-15 DIAGNOSIS — M79604 Pain in right leg: Secondary | ICD-10-CM | POA: Diagnosis present

## 2020-03-15 DIAGNOSIS — M792 Neuralgia and neuritis, unspecified: Secondary | ICD-10-CM | POA: Diagnosis present

## 2020-03-15 DIAGNOSIS — M25572 Pain in left ankle and joints of left foot: Secondary | ICD-10-CM

## 2020-03-15 DIAGNOSIS — M25522 Pain in left elbow: Secondary | ICD-10-CM | POA: Diagnosis present

## 2020-03-15 DIAGNOSIS — G894 Chronic pain syndrome: Secondary | ICD-10-CM

## 2020-03-15 DIAGNOSIS — M25521 Pain in right elbow: Secondary | ICD-10-CM | POA: Diagnosis present

## 2020-03-15 DIAGNOSIS — M542 Cervicalgia: Secondary | ICD-10-CM | POA: Diagnosis present

## 2020-03-15 NOTE — Patient Instructions (Addendum)
____________________________________________________________________________________________  Pain Prevention Technique  Definition:   A technique used to minimize the effects of an activity known to cause inflammation or swelling, which in turn leads to an increase in pain.  Purpose: To prevent swelling from occurring. It is based on the fact that it is easier to prevent swelling from happening than it is to get rid of it, once it occurs.  Contraindications: 1. Anyone with allergy or hypersensitivity to the recommended medications. 2. Anyone taking anticoagulants (Blood Thinners) (e.g., Coumadin, Warfarin, Plavix, etc.). 3. Patients in Renal Failure.  Technique: Before you undertake an activity known to cause pain, or a flare-up of your chronic pain, and before you experience any pain, do the following:  1. On a full stomach, take 4 (four) over the counter Ibuprofens 200mg  tablets (Motrin), for a total of 800 mg. 2. In addition, take over the counter Magnesium 400 to 500 mg, before doing the activity.  3. Six (6) hours later, again on a full stomach, repeat the Ibuprofen. 4. That night, take a warm shower and stretch under the running warm water.  This technique may be sufficient to abort the pain and discomfort before it happens. Keep in mind that it takes a lot less medication to prevent swelling than it takes to eliminate it once it occurs.  ____________________________________________________________________________________________   ____________________________________________________________________________________________  General Risks and Possible Complications  Patient Responsibilities: It is important that you read this as it is part of your informed consent. It is our duty to inform you of the risks and possible complications associated with treatments offered to you. It is your responsibility as a patient to read this and to ask questions about anything that is not clear  or that you believe was not covered in this document.  Patient's Rights: You have the right to refuse treatment. You also have the right to change your mind, even after initially having agreed to have the treatment done. However, under this last option, if you wait until the last second to change your mind, you may be charged for the materials used up to that point.  Introduction: Medicine is not an Chief Strategy Officer. Everything in Medicine, including the lack of treatment(s), carries the potential for danger, harm, or loss (which is by definition: Risk). In Medicine, a complication is a secondary problem, condition, or disease that can aggravate an already existing one. All treatments carry the risk of possible complications. The fact that a side effects or complications occurs, does not imply that the treatment was conducted incorrectly. It must be clearly understood that these can happen even when everything is done following the highest safety standards.  No treatment: You can choose not to proceed with the proposed treatment alternative. The "PRO(s)" would include: avoiding the risk of complications associated with the therapy. The "CON(s)" would include: not getting any of the treatment benefits. These benefits fall under one of three categories: diagnostic; therapeutic; and/or palliative. Diagnostic benefits include: getting information which can ultimately lead to improvement of the disease or symptom(s). Therapeutic benefits are those associated with the successful treatment of the disease. Finally, palliative benefits are those related to the decrease of the primary symptoms, without necessarily curing the condition (example: decreasing the pain from a flare-up of a chronic condition, such as incurable terminal cancer).  General Risks and Complications: These are associated to most interventional treatments. They can occur alone, or in combination. They fall under one of the following six (6)  categories: no benefit or worsening of  symptoms; bleeding; infection; nerve damage; allergic reactions; and/or death. 1. No benefits or worsening of symptoms: In Medicine there are no guarantees, only probabilities. No healthcare provider can ever guarantee that a medical treatment will work, they can only state the probability that it may. Furthermore, there is always the possibility that the condition may worsen, either directly, or indirectly, as a consequence of the treatment. 2. Bleeding: This is more common if the patient is taking a blood thinner, either prescription or over the counter (example: Goody Powders, Fish oil, Aspirin, Garlic, etc.), or if suffering a condition associated with impaired coagulation (example: Hemophilia, cirrhosis of the liver, low platelet counts, etc.). However, even if you do not have one on these, it can still happen. If you have any of these conditions, or take one of these drugs, make sure to notify your treating physician. 3. Infection: This is more common in patients with a compromised immune system, either due to disease (example: diabetes, cancer, human immunodeficiency virus [HIV], etc.), or due to medications or treatments (example: therapies used to treat cancer and rheumatological diseases). However, even if you do not have one on these, it can still happen. If you have any of these conditions, or take one of these drugs, make sure to notify your treating physician. 4. Nerve Damage: This is more common when the treatment is an invasive one, but it can also happen with the use of medications, such as those used in the treatment of cancer. The damage can occur to small secondary nerves, or to large primary ones, such as those in the spinal cord and brain. This damage may be temporary or permanent and it may lead to impairments that can range from temporary numbness to permanent paralysis and/or brain death. 5. Allergic Reactions: Any time a substance or material  comes in contact with our body, there is the possibility of an allergic reaction. These can range from a mild skin rash (contact dermatitis) to a severe systemic reaction (anaphylactic reaction), which can result in death. 6. Death: In general, any medical intervention can result in death, most of the time due to an unforeseen complication. ____________________________________________________________________________________________

## 2020-03-15 NOTE — Progress Notes (Signed)
Nursing Pain Medication Assessment:  Safety precautions to be maintained throughout the outpatient stay will include: orient to surroundings, keep bed in low position, maintain call bell within reach at all times, provide assistance with transfer out of bed and ambulation.  Medication Inspection Compliance: Mr. Ashmore did not comply with our request to bring his pills to be counted. He was reminded that bringing the medication bottles, even when empty, is a requirement.  Medication: None brought in. Pill/Patch Count: None available to be counted. Bottle Appearance: No container available. Did not bring bottle(s) to appointment. Filled Date: N/A Last Medication intake:  TodaySafety precautions to be maintained throughout the outpatient stay will include: orient to surroundings, keep bed in low position, maintain call bell within reach at all times, provide assistance with transfer out of bed and ambulation.

## 2020-05-12 ENCOUNTER — Other Ambulatory Visit: Payer: Medicare Other

## 2020-05-12 ENCOUNTER — Other Ambulatory Visit: Payer: Self-pay

## 2020-05-12 DIAGNOSIS — R300 Dysuria: Secondary | ICD-10-CM

## 2020-05-13 ENCOUNTER — Encounter: Payer: Self-pay | Admitting: Urology

## 2020-05-13 ENCOUNTER — Ambulatory Visit (INDEPENDENT_AMBULATORY_CARE_PROVIDER_SITE_OTHER): Payer: Medicare Other | Admitting: Urology

## 2020-05-13 VITALS — BP 116/76 | HR 68 | Ht 72.0 in | Wt 190.0 lb

## 2020-05-13 DIAGNOSIS — F5232 Male orgasmic disorder: Secondary | ICD-10-CM | POA: Diagnosis not present

## 2020-05-13 DIAGNOSIS — R102 Pelvic and perineal pain: Secondary | ICD-10-CM | POA: Diagnosis not present

## 2020-05-13 DIAGNOSIS — Z125 Encounter for screening for malignant neoplasm of prostate: Secondary | ICD-10-CM

## 2020-05-13 LAB — PSA TOTAL (REFLEX TO FREE): Prostate Specific Ag, Serum: 1.5 ng/mL (ref 0.0–4.0)

## 2020-05-13 NOTE — Progress Notes (Signed)
   05/13/2020 12:49 PM   Keymari [facility] 22-Apr-1950 [id number]  Reason for visit: Follow up PSA screening, scrotal pain, delayed ejaculation  HPI: I saw Mr. May had in urology clinic for follow-up of the above issues.  He is a 70 year old male with well-managed Parkinson's disease who we have followed for routine PSA screening.  PSA has been stable ranging from 0.7-1.7 over the last 5 years.  Recent PSA 05/12/2020 is stable at 1.5.  He denies any changes in urination or gross hematuria.  He has not had any problems over the last year with any scrotal pain, and he has chronic extremity pain he attributes to Parkinson's.  He does have some trouble with delayed ejaculation, and after reviewing his med list I suspect this is secondary to tramadol.  He denies any trouble with erections.  We reviewed the AUA guidelines regarding PSA screening up to age 15, and reviewed the risks and benefits.  Like to continue yearly screening.  DRE deferred today in the setting of stable PSA for the last 5 years.  RTC 1 year with PSA prior   Billey Co, MD  [facility] [place] [address] [place], [place] [address] [phone]

## 2020-05-13 NOTE — Patient Instructions (Signed)
Prostate Cancer Screening  Prostate cancer screening is a test that is done to check for the presence of prostate cancer in men. The prostate gland is a walnut-sized gland that is located below the bladder and in front of the rectum in males. The function of the prostate is to add fluid to semen during ejaculation. Prostate cancer is the second most common type of cancer in men. Who should have prostate cancer screening?  Screening recommendations vary based on age and other risk factors. Screening is recommended if:  You are older than age 55. If you are age 55-69, talk with your health care provider about your need for screening and how often screening should be done. Because most prostate cancers are slow growing and will not cause death, screening is generally reserved in this age group for men who have a 10-15-year life expectancy.  You are younger than age 55, and you have these risk factors: ? Being a black male or a male of African descent. ? Having a father, brother, or uncle who has been diagnosed with prostate cancer. The risk is higher if your family member's cancer occurred at an early age. Screening is not recommended if:  You are younger than age 40.  You are between the ages of 40 and 54 and you have no risk factors.  You are 70 years of age or older. At this age, the risks that screening can cause are greater than the benefits that it may provide. If you are at high risk for prostate cancer, your health care provider may recommend that you have screenings more often or that you start screening at a younger age. How is screening for prostate cancer done? The recommended prostate cancer screening test is a blood test called the prostate-specific antigen (PSA) test. PSA is a protein that is made in the prostate. As you age, your prostate naturally produces more PSA. Abnormally high PSA levels may be caused by:  Prostate cancer.  An enlarged prostate that is not caused by cancer  (benign prostatic hyperplasia, BPH). This condition is very common in older men.  A prostate gland infection (prostatitis). Depending on the PSA results, you may need more tests, such as:  A physical exam to check the size of your prostate gland.  Blood and imaging tests.  A procedure to remove tissue samples from your prostate gland for testing (biopsy). What are the benefits of prostate cancer screening?  Screening can help to identify cancer at an early stage, before symptoms start and when the cancer can be treated more easily.  There is a small chance that screening may lower your risk of dying from prostate cancer. The chance is small because prostate cancer is a slow-growing cancer, and most men with prostate cancer die from a different cause. What are the risks of prostate cancer screening? The main risk of prostate cancer screening is diagnosing and treating prostate cancer that would never have caused any symptoms or problems. This is called overdiagnosisand overtreatment. PSA screening cannot tell you if your PSA is high due to cancer or a different cause. A prostate biopsy is the only procedure to diagnose prostate cancer. Even the results of a biopsy may not tell you if your cancer needs to be treated. Slow-growing prostate cancer may not need any treatment other than monitoring, so diagnosing and treating it may cause unnecessary stress or other side effects. A prostate biopsy may also cause:  Infection or fever.  A false negative. This is   a result that shows that you do not have prostate cancer when you actually do have prostate cancer. Questions to ask your health care provider  When should I start prostate cancer screening?  What is my risk for prostate cancer?  How often do I need screening?  What type of screening tests do I need?  How do I get my test results?  What do my results mean?  Do I need treatment? Where to find more information  The American Cancer  Society: www.cancer.org  American Urological Association: www.auanet.org Contact a health care provider if:  You have difficulty urinating.  You have pain when you urinate or ejaculate.  You have blood in your urine or semen.  You have pain in your back or in the area of your prostate. Summary  Prostate cancer is a common type of cancer in men. The prostate gland is located below the bladder and in front of the rectum. This gland adds fluid to semen during ejaculation.  Prostate cancer screening may identify cancer at an early stage, when the cancer can be treated more easily.  The prostate-specific antigen (PSA) test is the recommended screening test for prostate cancer.  Discuss the risks and benefits of prostate cancer screening with your health care provider. If you are age 42 or older, the risks that screening can cause are greater than the benefits that it may provide. This information is not intended to replace advice given to you by your health care provider. Make sure you discuss any questions you have with your health care provider. Document Revised: 07/04/2019 Document Reviewed: 10/24/2018 Elsevier Patient Education  [place].

## 2020-06-16 ENCOUNTER — Ambulatory Visit: Payer: Medicare Other | Admitting: Dermatology

## 2020-06-16 ENCOUNTER — Other Ambulatory Visit: Payer: Self-pay

## 2020-06-16 ENCOUNTER — Encounter: Payer: Self-pay | Admitting: Dermatology

## 2020-06-16 DIAGNOSIS — Z1283 Encounter for screening for malignant neoplasm of skin: Secondary | ICD-10-CM

## 2020-06-16 DIAGNOSIS — D0362 Melanoma in situ of left upper limb, including shoulder: Secondary | ICD-10-CM

## 2020-06-16 DIAGNOSIS — L570 Actinic keratosis: Secondary | ICD-10-CM

## 2020-06-16 DIAGNOSIS — D485 Neoplasm of uncertain behavior of skin: Secondary | ICD-10-CM | POA: Diagnosis not present

## 2020-06-16 DIAGNOSIS — L821 Other seborrheic keratosis: Secondary | ICD-10-CM

## 2020-06-16 DIAGNOSIS — L578 Other skin changes due to chronic exposure to nonionizing radiation: Secondary | ICD-10-CM | POA: Diagnosis not present

## 2020-06-16 DIAGNOSIS — B351 Tinea unguium: Secondary | ICD-10-CM | POA: Diagnosis not present

## 2020-06-16 DIAGNOSIS — B353 Tinea pedis: Secondary | ICD-10-CM

## 2020-06-16 DIAGNOSIS — D229 Melanocytic nevi, unspecified: Secondary | ICD-10-CM

## 2020-06-16 DIAGNOSIS — L814 Other melanin hyperpigmentation: Secondary | ICD-10-CM

## 2020-06-16 DIAGNOSIS — D1800 Hemangioma unspecified site: Secondary | ICD-10-CM

## 2020-06-16 DIAGNOSIS — L820 Inflamed seborrheic keratosis: Secondary | ICD-10-CM

## 2020-06-16 HISTORY — DX: Actinic keratosis: L57.0

## 2020-06-16 MED ORDER — KETOCONAZOLE 2 % EX CREA: TOPICAL_CREAM | CUTANEOUS | 6 refills | Status: AC

## 2020-06-16 MED ORDER — JUBLIA 10 % EX SOLN: CUTANEOUS | 6 refills | Status: AC

## 2020-06-16 NOTE — Progress Notes (Signed)
Follow-Up Visit   Subjective  Chris Johnson is a 70 y.o. male who presents for the following: Annual Exam (Hx AK's - patient has noticed new lesions scattered on his torso that he would like checked). The patient presents for Total-Body Skin Exam (TBSE) for skin cancer screening and mole check.  The following portions of the chart were reviewed this encounter and updated as appropriate:   Tobacco  Allergies  Meds  Problems  Med Hx  Surg Hx  Fam Hx     Review of Systems:  No other skin or systemic complaints except as noted in HPI or Assessment and Plan.  Objective  Well appearing patient in no apparent distress; mood and affect are within normal limits.  A full examination was performed including scalp, head, eyes, ears, nose, lips, neck, chest, axillae, abdomen, back, buttocks, bilateral upper extremities, bilateral lower extremities, hands, feet, fingers, toes, fingernails, and toenails. All findings within normal limits unless otherwise noted below.  Objective  Face (9): Erythematous thin papules/macules with gritty scale.   Objective  L mid lat scapula: 0.6 cm irregular brown macule   Objective  L post deltoid: 0.8 cm pink patch   Objective  Trunk x 5, R side x 1 (6): Erythematous keratotic or waxy stuck-on papule or plaque.    Assessment & Plan  AK (actinic keratosis) (9) Face  Destruction of lesion - Face Complexity: simple   Destruction method: cryotherapy   Informed consent: discussed and consent obtained   Timeout:  patient name, date of birth, surgical site, and procedure verified Lesion destroyed using liquid nitrogen: Yes   Region frozen until ice ball extended beyond lesion: Yes   Outcome: patient tolerated procedure well with no complications   Post-procedure details: wound care instructions given    Neoplasm of uncertain behavior of skin (2) L mid lat scapula  Epidermal / dermal shaving  Lesion diameter (cm):  0.6 Informed consent:  discussed and consent obtained   Timeout: patient name, date of birth, surgical site, and procedure verified   Procedure prep:  Patient was prepped and draped in usual sterile fashion Prep type:  Isopropyl alcohol Anesthesia: the lesion was anesthetized in a standard fashion   Anesthetic:  1% lidocaine w/ epinephrine 1-100,000 buffered w/ 8.4% NaHCO3 Instrument used: flexible razor blade   Hemostasis achieved with: pressure, aluminum chloride and electrodesiccation   Outcome: patient tolerated procedure well   Post-procedure details: sterile dressing applied and wound care instructions given   Dressing type: bandage and petrolatum    Specimen 1 - Surgical pathology Differential Diagnosis: D48.5 r/o dysplastic nevus  Check Margins: No 0.6 cm irregular brown macule  L post deltoid  Skin / nail biopsy Type of biopsy: tangential   Informed consent: discussed and consent obtained   Timeout: patient name, date of birth, surgical site, and procedure verified   Procedure prep:  Patient was prepped and draped in usual sterile fashion Prep type:  Isopropyl alcohol Anesthesia: the lesion was anesthetized in a standard fashion   Anesthetic:  1% lidocaine w/ epinephrine 1-100,000 buffered w/ 8.4% NaHCO3 Instrument used: flexible razor blade   Hemostasis achieved with: pressure, aluminum chloride and electrodesiccation   Outcome: patient tolerated procedure well   Post-procedure details: sterile dressing applied and wound care instructions given   Dressing type: bandage and petrolatum    Specimen 2 - Surgical pathology Differential Diagnosis: D48.5 r/o BCC  Check Margins: No 0.8 cm pink patch  Inflamed seborrheic keratosis (6) Trunk x 5, R  side x 1  Recheck the right side at follow up appointment  Destruction of lesion - Trunk x 5, R side x 1 Complexity: simple   Destruction method: cryotherapy   Informed consent: discussed and consent obtained   Timeout:  patient name, date of  birth, surgical site, and procedure verified Lesion destroyed using liquid nitrogen: Yes   Region frozen until ice ball extended beyond lesion: Yes   Outcome: patient tolerated procedure well with no complications   Post-procedure details: wound care instructions given    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   ketoconazole (NIZORAL) 2 % cream - B/L foot  Efinaconazole (JUBLIA) 10 % SOLN - B/L foot   Lentigines - Scattered tan macules - Due to sun exposure - Benign-appering, observe - Recommend daily broad spectrum sunscreen SPF 30+ to sun-exposed areas, reapply every 2 hours as needed. - Call for any changes  Seborrheic Keratoses - Stuck-on, waxy, tan-brown papules and plaques  - Discussed benign etiology and prognosis. - Observe - Call for any changes  Melanocytic Nevi - Tan-brown and/or pink-flesh-colored symmetric macules and papules - Benign appearing on exam today - Observation - Call clinic for new or changing moles - Recommend daily use of broad spectrum spf 30+ sunscreen to sun-exposed areas.   Hemangiomas - Red papules - Discussed benign nature - Observe - Call for any changes  Actinic Damage - Chronic, secondary to cumulative UV/sun exposure - diffuse scaly erythematous macules with underlying dyspigmentation - Recommend daily broad spectrum sunscreen SPF 30+ to sun-exposed areas, reapply every 2 hours as needed.  - Call for new or changing lesions.  Severe, Confluent Chronic Actinic Changes with Pre-Cancerous Actinic Keratoses due to cumulative sun exposure/UV radiation exposure over time - Discussed Prescription "Field Treatment" Field treatment involves treatment of an entire area of skin that has confluent Actinic Changes (Sun/ Ultraviolet light damage) and PreCancerous Actinic Keratoses by method of PhotoDynamic Therapy (PDT) and/or prescription  Topical Chemotherapy agents such as 5-fluorouracil, 5-fluorouracil/calcipotriene, and/or imiquimod.  The purpose is to decrease the number of clinically evident and subclinical PreCancerous lesions to prevent progression to development of skin cancer by chemically destroying early precancer changes that may or may not be visible.  It has been shown to reduce the risk of developing skin cancer in the treated area. As a result of treatment, redness, scaling, crusting, and open sores may occur during treatment course. One or more than one of these methods may be used and may have to be used several times to control, suppress and eliminate the PreCancerous changes. Discussed treatment course, expected reaction, and possible side effects. - Consider PDT in the future but not recommended today.   Skin cancer screening performed today.  Return in about 6 months (around 12/17/2020) for AK follow up .  Luther Redo, CMA, am acting as scribe for [name], MD .  Documentation: I have reviewed the above documentation for accuracy and completeness, and I agree with the above.  [name], MD

## 2020-06-16 NOTE — Patient Instructions (Addendum)
If you have any questions or concerns for your doctor, please call our main line at [phone] and press option 4 to reach your doctor's medical assistant. If no one answers, please leave a voicemail as directed and we will return your call as soon as possible. Messages left after 4 pm will be answered the following business day.   You may also send us a message via MyChart. We typically respond to MyChart messages within 1-2 business days.  For prescription refills, please ask your pharmacy to contact our office. Our fax number is [phone].  If you have an urgent issue when the clinic is closed that cannot wait until the next business day, you can page your doctor at the number below.    Please note that while we do our best to be available for urgent issues outside of office hours, we are not available 24/7.   If you have an urgent issue and are unable to reach us, you may choose to seek medical care at your doctor's office, retail clinic, urgent care center, or emergency room.  If you have a medical emergency, please immediately call 911 or go to the emergency department.  Pager Numbers  - Dr. Kowalski: [phone]  - Dr. Moye: [phone]  - Dr. Stewart: [phone]  In the event of inclement weather, please call our main line at [phone] for an update on the status of any delays or closures.  Dermatology Medication Tips: Please keep the boxes that topical medications come in in order to help keep track of the instructions about where and how to use these. Pharmacies typically print the medication instructions only on the boxes and not directly on the medication tubes.   If your medication is too expensive, please contact our office at [phone] option 4 or send us a message through MyChart.   We are unable to tell what your co-pay for medications will be in advance as this is different depending on your insurance coverage. However, we may be able to find a  substitute medication at lower cost or fill out paperwork to get insurance to cover a needed medication.   If a prior authorization is required to get your medication covered by your insurance company, please allow us 1-2 business days to complete this process.  Drug prices often vary depending on where the prescription is filled and some pharmacies may offer cheaper prices.  The website www.goodrx.com contains coupons for medications through different pharmacies. The prices here do not account for what the cost may be with help from insurance (it may be cheaper with your insurance), but the website can give you the price if you did not use any insurance.  - You can print the associated coupon and take it with your prescription to the pharmacy.  - You may also stop by our office during regular business hours and pick up a GoodRx coupon card.  - If you need your prescription sent electronically to a different pharmacy, notify our office through [facility] MyChart or by phone at [phone] option 4.     Wound Care Instructions  1. Cleanse wound gently with soap and water once a day then pat dry with clean gauze. Apply a thing coat of Petrolatum (petroleum jelly, "Vaseline") over the wound (unless you have an allergy to this). We recommend that you use a new, sterile tube of Vaseline. Do not pick or remove scabs. Do not remove the yellow or white "healing tissue" from the base of the wound.    2. Cover the wound with fresh, clean, nonstick gauze and secure with paper tape. You may use Band-Aids in place of gauze and tape if the would is small enough, but would recommend trimming much of the tape off as there is often too much. Sometimes Band-Aids can irritate the skin.  3. You should call the office for your biopsy report after 1 week if you have not already been contacted.  4. If you experience any problems, such as abnormal amounts of bleeding, swelling, significant bruising, significant pain,  or evidence of infection, please call the office immediately.  5. FOR ADULT SURGERY PATIENTS: If you need something for pain relief you may take 1 extra strength Tylenol (acetaminophen) AND 2 Ibuprofen (200mg each) together every 4 hours as needed for pain. (do not take these if you are allergic to them or if you have a reason you should not take them.) Typically, you may only need pain medication for 1 to 3 days.     

## 2020-06-17 ENCOUNTER — Encounter: Payer: Self-pay | Admitting: Dermatology

## 2020-06-21 ENCOUNTER — Telehealth: Payer: Self-pay

## 2020-06-21 NOTE — Telephone Encounter (Signed)
LM on VM please return my call, pt need to schedule surgery appt

## 2020-06-21 NOTE — Telephone Encounter (Signed)
-----   Message from Ralene Bathe, MD sent at 06/21/2020  2:04 PM EDT ----- Diagnosis 1. Skin , L mid lat scapula, shave MALIGNANT MELANOMA IN SITU, LATERAL MARGIN INVOLVED, SEE DESCRIPTION 2. Skin , L post deltoid, shave LICHENOID ACTINIC KERATOSIS  1- Cancer - Melanoma in situ Superficial and early Schedule for surgery = wide local excision Discussed with pt by phone and he is awaiting call to schedule surgery 2- PreCancer  Plan LN2 treatment at next visit

## 2020-06-22 DIAGNOSIS — D039 Melanoma in situ, unspecified: Secondary | ICD-10-CM

## 2020-06-22 HISTORY — DX: Melanoma in situ, unspecified: D03.9

## 2020-07-19 ENCOUNTER — Other Ambulatory Visit: Payer: Self-pay | Admitting: Pain Medicine

## 2020-07-19 DIAGNOSIS — G8929 Other chronic pain: Secondary | ICD-10-CM

## 2020-07-19 DIAGNOSIS — G608 Other hereditary and idiopathic neuropathies: Secondary | ICD-10-CM

## 2020-07-19 DIAGNOSIS — M792 Neuralgia and neuritis, unspecified: Secondary | ICD-10-CM

## 2020-07-20 ENCOUNTER — Ambulatory Visit: Payer: Medicare Other | Admitting: Dermatology

## 2020-07-20 ENCOUNTER — Encounter: Payer: Self-pay | Admitting: Dermatology

## 2020-07-20 ENCOUNTER — Telehealth: Payer: Self-pay

## 2020-07-20 ENCOUNTER — Other Ambulatory Visit: Payer: Self-pay

## 2020-07-20 DIAGNOSIS — D0359 Melanoma in situ of other part of trunk: Secondary | ICD-10-CM | POA: Diagnosis not present

## 2020-07-20 DIAGNOSIS — L570 Actinic keratosis: Secondary | ICD-10-CM

## 2020-07-20 MED ORDER — MUPIROCIN 2 % EX OINT: 1.0000 "application " | TOPICAL_OINTMENT | Freq: Every day | CUTANEOUS | 1 refills | Status: DC

## 2020-07-20 NOTE — Telephone Encounter (Signed)
Spoke with patient regarding surgery. He is doing okay, just a little uncomfortable/hd

## 2020-07-20 NOTE — Patient Instructions (Signed)
If you have any questions or concerns for your doctor, please call our main line at [phone] and press option 4 to reach your doctor's medical assistant. If no one answers, please leave a voicemail as directed and we will return your call as soon as possible. Messages left after 4 pm will be answered the following business day.   You may also send us a message via MyChart. We typically respond to MyChart messages within 1-2 business days.  For prescription refills, please ask your pharmacy to contact our office. Our fax number is [phone].  If you have an urgent issue when the clinic is closed that cannot wait until the next business day, you can page your doctor at the number below.    Please note that while we do our best to be available for urgent issues outside of office hours, we are not available 24/7.   If you have an urgent issue and are unable to reach us, you may choose to seek medical care at your doctor's office, retail clinic, urgent care center, or emergency room.  If you have a medical emergency, please immediately call 911 or go to the emergency department.  Pager Numbers  - Dr. Kowalski: [phone]  - Dr. Moye: [phone]  - Dr. Stewart: [phone]  In the event of inclement weather, please call our main line at [phone] for an update on the status of any delays or closures.  Dermatology Medication Tips: Please keep the boxes that topical medications come in in order to help keep track of the instructions about where and how to use these. Pharmacies typically print the medication instructions only on the boxes and not directly on the medication tubes.   If your medication is too expensive, please contact our office at [phone] option 4 or send us a message through MyChart.   We are unable to tell what your co-pay for medications will be in advance as this is different depending on your insurance coverage. However, we may be able to find a  substitute medication at lower cost or fill out paperwork to get insurance to cover a needed medication.   If a prior authorization is required to get your medication covered by your insurance company, please allow us 1-2 business days to complete this process.  Drug prices often vary depending on where the prescription is filled and some pharmacies may offer cheaper prices.  The website www.goodrx.com contains coupons for medications through different pharmacies. The prices here do not account for what the cost may be with help from insurance (it may be cheaper with your insurance), but the website can give you the price if you did not use any insurance.  - You can print the associated coupon and take it with your prescription to the pharmacy.  - You may also stop by our office during regular business hours and pick up a GoodRx coupon card.  - If you need your prescription sent electronically to a different pharmacy, notify our office through [facility] MyChart or by phone at [phone] option 4.     Wound Care Instructions  1. Cleanse wound gently with soap and water once a day then pat dry with clean gauze. Apply a thing coat of Petrolatum (petroleum jelly, "Vaseline") over the wound (unless you have an allergy to this). We recommend that you use a new, sterile tube of Vaseline. Do not pick or remove scabs. Do not remove the yellow or white "healing tissue" from the base of the wound.    2. Cover the wound with fresh, clean, nonstick gauze and secure with paper tape. You may use Band-Aids in place of gauze and tape if the would is small enough, but would recommend trimming much of the tape off as there is often too much. Sometimes Band-Aids can irritate the skin.  3. You should call the office for your biopsy report after 1 week if you have not already been contacted.  4. If you experience any problems, such as abnormal amounts of bleeding, swelling, significant bruising, significant pain,  or evidence of infection, please call the office immediately.  5. FOR ADULT SURGERY PATIENTS: If you need something for pain relief you may take 1 extra strength Tylenol (acetaminophen) AND 2 Ibuprofen (200mg each) together every 4 hours as needed for pain. (do not take these if you are allergic to them or if you have a reason you should not take them.) Typically, you may only need pain medication for 1 to 3 days.     

## 2020-07-20 NOTE — Progress Notes (Signed)
Follow-Up Visit   Subjective  Chris Johnson is a 70 y.o. male who presents for the following: Melanoma IS bx proven (L mid lat scapula, pt presents for treatment today) and AK bx proven (L post deltoid).  The following portions of the chart were reviewed this encounter and updated as appropriate:   Tobacco  Allergies  Meds  Problems  Med Hx  Surg Hx  Fam Hx     Review of Systems:  No other skin or systemic complaints except as noted in HPI or Assessment and Plan.  Objective  Well appearing patient in no apparent distress; mood and affect are within normal limits.  A focused examination was performed including back. Relevant physical exam findings are noted in the Assessment and Plan.  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 Skin excision  Lesion length (cm):  1 Lesion width (cm):  0.8 Margin per side (cm):  0.7 Total excision diameter (cm):  2.4 Informed consent: discussed and consent obtained   Timeout: patient name, date of birth, surgical site, and procedure verified   Procedure prep:  Patient was prepped and draped in usual sterile fashion Prep type:  Isopropyl alcohol and povidone-iodine Anesthesia: the lesion was anesthetized in a standard fashion   Anesthesia comment:  11cc Anesthetic:  1% lidocaine w/ epinephrine 1-100,000 buffered w/ 8.4% NaHCO3 (0.5% bupivicaine buffered w/ 8.4% NaHC03) Instrument used: #15 blade   Hemostasis achieved with: pressure   Hemostasis achieved with comment:  Electrocautery Outcome: patient tolerated procedure well with no complications   Post-procedure details: sterile dressing applied and wound care instructions given   Dressing type: bacitracin and pressure dressing (Mupirocin)    Skin repair Complexity:  Complex Final length (cm):  4.5 Reason for type of repair: reduce tension  to allow closure, reduce the risk of dehiscence, infection, and necrosis, reduce subcutaneous dead space and avoid a hematoma, allow closure of the large defect, preserve normal anatomy, preserve normal anatomical and functional relationships and enhance both functionality and cosmetic results   Undermining: area extensively undermined   Undermining comment:  Undermining Defect 2.4cm Subcutaneous layers (deep stitches):  Suture size:  2-0 Suture type: Vicryl (polyglactin 910)   Subcutaneous suture technique: Inverted Dermal. Fine/surface layer approximation (top stitches):  Suture size:  2-0 Suture type: nylon   Stitches: simple running   Suture removal (days):  7 Hemostasis achieved with: pressure Outcome: patient tolerated procedure well with no complications   Post-procedure details: sterile dressing applied and wound care instructions given   Dressing type: bandage, pressure dressing and bacitracin (Mupirocin)    mupirocin ointment (BACTROBAN) 2 %  Specimen 1 - Surgical pathology Differential Diagnosis: Melanoma IS bx proven  Check Margins: yes Pink bx site 1.0 x 0.8cm  Bx proven, L mid lat scapula, excised today  Start Mupirocin oint qd to excision site  Discussed diagnosis in detail including significance of melanoma diagnosis which can be potentially lethal.  Discussed treatment recommendations in detail advising that treatment recommendations are based on longitudinal studies and retrospective studies and are nationwide protocols.  Advised there is always potential for recurrence even after definitive treatment.  After definitive treatment, we recommend total-body skin exams every 3 months for a year; then every 4 months for a year; then every 6 months for 3 years.  At 5 years post treatment, if  all looks good we would recommend at least yearly total-body skin exams for the rest of your life.  The patient was given time for questions and these were answered.  We recommend  frequent self skin examinations; photoprotection with sunscreen, sun protective clothing, hats, sunglasses and sun avoidance.  If the patient notices any new or changing skin lesions the patient should return to the office immediately for evaluation.    AK (actinic keratosis) Left post deltoid Bx proven Plan LN2 on f/u  Return in about 1 week (around 07/27/2020) for suture removal, and txt bx proven AK L post deltoid.  I, Othelia Pulling, RMA, am acting as scribe for [name], MD .  Documentation: I have reviewed the above documentation for accuracy and completeness, and I agree with the above.  [name], MD

## 2020-07-22 ENCOUNTER — Encounter: Payer: Self-pay | Admitting: Dermatology

## 2020-07-26 ENCOUNTER — Telehealth: Payer: Self-pay | Admitting: Pain Medicine

## 2020-07-26 NOTE — Telephone Encounter (Signed)
Called patient and left VM for him to ask his PCP for refills of all non-narcotic medications such as Lyrica.

## 2020-07-27 ENCOUNTER — Other Ambulatory Visit: Payer: Self-pay

## 2020-07-27 ENCOUNTER — Ambulatory Visit: Payer: Medicare Other | Admitting: Dermatology

## 2020-07-27 DIAGNOSIS — L570 Actinic keratosis: Secondary | ICD-10-CM

## 2020-07-27 DIAGNOSIS — L578 Other skin changes due to chronic exposure to nonionizing radiation: Secondary | ICD-10-CM

## 2020-07-27 DIAGNOSIS — L821 Other seborrheic keratosis: Secondary | ICD-10-CM

## 2020-07-27 DIAGNOSIS — Z86006 Personal history of melanoma in-situ: Secondary | ICD-10-CM

## 2020-07-27 NOTE — Progress Notes (Signed)
   Follow-Up Visit   Subjective  Chris Johnson is a 70 y.o. 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  The following portions of the chart were reviewed this encounter and updated as appropriate:   Tobacco  Allergies  Meds  Problems  Med Hx  Surg Hx  Fam Hx     Review of Systems:  No other skin or systemic complaints except as noted in HPI or Assessment and Plan.  Objective  Well appearing patient in no apparent distress; mood and affect are within normal limits.  A focused examination was performed including the trunk and extremities. Relevant physical exam findings are noted in the Assessment and Plan.  Objective  L mid lat scapula: Healing excision site.  Objective  L post deltoid: Erythematous thin papules/macules with gritty scale.   Assessment & Plan  History of melanoma in situ L mid lat scapula  Encounter for Removal of Sutures - Incision site at the L mid lat scapula is clean, dry and intact - Wound cleansed, sutures removed, wound cleansed and steri strips applied.  - Discussed pathology results showing margins free melanoma in situ. - Patient advised to keep steri-strips dry until they fall off. - Scars remodel for a full year. - Once steri-strips fall off, patient can apply over-the-counter silicone scar cream each night to help with scar remodeling if desired. - Patient advised to call with any concerns or if they notice any new or changing lesions.   AK (actinic keratosis) L post deltoid  Bx proven -   Destruction of lesion - L post deltoid Complexity: simple   Destruction method: cryotherapy   Informed consent: discussed and consent obtained   Timeout:  patient name, date of birth, surgical site, and procedure verified Lesion destroyed using liquid  nitrogen: Yes   Region frozen until ice ball extended beyond lesion: Yes   Outcome: patient tolerated procedure well with no complications   Post-procedure details: wound care instructions given    Seborrheic Keratoses - Stuck-on, waxy, tan-brown papules and/or plaques  - Benign-appearing - Discussed benign etiology and prognosis. - Observe - Call for any changes  Actinic Damage - chronic, secondary to cumulative UV radiation exposure/sun exposure over time - diffuse scaly erythematous macules with underlying dyspigmentation - Recommend daily broad spectrum sunscreen SPF 30+ to sun-exposed areas, reapply every 2 hours as needed.  - Recommend staying in the shade or wearing long sleeves, sun glasses (UVA+UVB protection) and wide brim hats (4-inch brim around the entire circumference of the hat). - Call for new or changing lesions.  Return in about 3 months (around 10/27/2020) for TBSE - cancel Sept appointment.  Luther Redo, CMA, am acting as scribe for [name], MD .  Documentation: I have reviewed the above documentation for accuracy and completeness, and I agree with the above.  [name], MD

## 2020-07-29 ENCOUNTER — Encounter: Payer: Self-pay | Admitting: Dermatology

## 2020-08-02 ENCOUNTER — Other Ambulatory Visit: Payer: Self-pay | Admitting: Cardiovascular Disease

## 2020-08-12 NOTE — Telephone Encounter (Signed)
Medications transferred to PCP

## 2020-09-06 ENCOUNTER — Encounter: Payer: Medicare Other | Admitting: Pain Medicine

## 2020-09-21 ENCOUNTER — Other Ambulatory Visit (HOSPITAL_COMMUNITY): Payer: Self-pay | Admitting: Neurosurgery

## 2020-09-21 ENCOUNTER — Other Ambulatory Visit: Payer: Self-pay | Admitting: Neurosurgery

## 2020-09-21 DIAGNOSIS — G8929 Other chronic pain: Secondary | ICD-10-CM

## 2020-09-21 DIAGNOSIS — M545 Low back pain, unspecified: Secondary | ICD-10-CM

## 2020-09-30 ENCOUNTER — Other Ambulatory Visit: Payer: Self-pay

## 2020-09-30 ENCOUNTER — Ambulatory Visit
Admission: RE | Admit: 2020-09-30 | Discharge: 2020-09-30 | Disposition: A | Discharge status: Home / Self Care | Payer: Medicare Other | Source: Ambulatory Visit | Attending: Neurosurgery | Admitting: Neurosurgery

## 2020-09-30 DIAGNOSIS — M545 Low back pain, unspecified: Secondary | ICD-10-CM | POA: Insufficient documentation

## 2020-09-30 DIAGNOSIS — G8929 Other chronic pain: Secondary | ICD-10-CM

## 2020-11-03 ENCOUNTER — Ambulatory Visit: Payer: Medicare Other | Admitting: Dermatology

## 2020-11-03 ENCOUNTER — Other Ambulatory Visit: Payer: Self-pay

## 2020-11-03 DIAGNOSIS — L814 Other melanin hyperpigmentation: Secondary | ICD-10-CM

## 2020-11-03 DIAGNOSIS — Z1283 Encounter for screening for malignant neoplasm of skin: Secondary | ICD-10-CM | POA: Diagnosis not present

## 2020-11-03 DIAGNOSIS — L578 Other skin changes due to chronic exposure to nonionizing radiation: Secondary | ICD-10-CM | POA: Diagnosis not present

## 2020-11-03 DIAGNOSIS — Z86006 Personal history of melanoma in-situ: Secondary | ICD-10-CM

## 2020-11-03 DIAGNOSIS — D1800 Hemangioma unspecified site: Secondary | ICD-10-CM

## 2020-11-03 DIAGNOSIS — D229 Melanocytic nevi, unspecified: Secondary | ICD-10-CM

## 2020-11-03 DIAGNOSIS — L821 Other seborrheic keratosis: Secondary | ICD-10-CM

## 2020-11-03 DIAGNOSIS — L570 Actinic keratosis: Secondary | ICD-10-CM | POA: Diagnosis not present

## 2020-11-03 DIAGNOSIS — L820 Inflamed seborrheic keratosis: Secondary | ICD-10-CM | POA: Diagnosis not present

## 2020-11-03 NOTE — Patient Instructions (Addendum)
Instructions for [facility] Medications  One or more of your medications was sent to the [facility] mail order compounding pharmacy. You will receive an email from them and can purchase the medicine through that link. It will then be mailed to your home at the address you confirmed. If for any reason you do not receive an email from them, please check your spam folder. If you still do not find the email, please let us know. [facility] phone number is [phone].   Cryotherapy Aftercare  Wash gently with soap and water everyday.   Apply Vaseline and Band-Aid daily until healed.    If you have any questions or concerns for your doctor, please call our main line at [phone] and press option 4 to reach your doctor's medical assistant. If no one answers, please leave a voicemail as directed and we will return your call as soon as possible. Messages left after 4 pm will be answered the following business day.   You may also send [place] a message via [place]. We typically respond to MyChart messages within 1-2 business days.  For prescription refills, please ask your pharmacy to contact our office. Our fax number is [phone].  If you have an urgent issue when the clinic is closed that cannot wait until the next business day, you can page your doctor at the number below.    Please note that while we do our best to be available for urgent issues outside of office hours, we are not available 24/7.   If you have an urgent issue and are unable to reach [place], you may choose to seek medical care at your doctor's office, retail clinic, urgent care center, or emergency room.  If you have a medical emergency, please immediately call 911 or go to the emergency department.  Pager Numbers  - Dr. Nehemiah Massed: [phone]  - Dr. Laurence Ferrari: [phone]  - Dr. Nicole Kindred: [phone]  In the event of inclement weather, please call our main line at [phone] for an update on the status of any  delays or closures.  Dermatology Medication Tips: Please keep the boxes that topical medications come in in order to help keep track of the instructions about where and how to use these. Pharmacies typically print the medication instructions only on the boxes and not directly on the medication tubes.   If your medication is too expensive, please contact our office at [phone] option 4 or send [place] a message through [place].   We are unable to tell what your co-pay for medications will be in advance as this is different depending on your insurance coverage. However, we may be able to find a substitute medication at lower cost or fill out paperwork to get insurance to cover a needed medication.   If a prior authorization is required to get your medication covered by your insurance company, please allow [place] 1-2 business days to complete this process.  Drug prices often vary depending on where the prescription is filled and some pharmacies may offer cheaper prices.  The website www.goodrx.com contains coupons for medications through different pharmacies. The prices here do not account for what the cost may be with help from insurance (it may be cheaper with your insurance), but the website can give you the price if you did not use any insurance.  - You can print the associated coupon and take it with your prescription to the pharmacy.  - You may also stop by our office during regular business hours and  pick up a GoodRx coupon card.  - If you need your prescription sent electronically to a different pharmacy, notify our office through [facility] or by phone at [phone] option 4.

## 2020-11-03 NOTE — Progress Notes (Signed)
Follow-Up Visit   Subjective  Chris Johnson is a 70 y.o. male who presents for the following: Other (History of Melanoma in situ of left mid lat scapula (2022) - TBSE today). Accompanied by wife who contributes to history. The patient presents for Total-Body Skin Exam (TBSE) for skin cancer screening and mole check.  The following portions of the chart were reviewed this encounter and updated as appropriate:   Tobacco  Allergies  Meds  Problems  Med Hx  Surg Hx  Fam Hx     Review of Systems:  No other skin or systemic complaints except as noted in HPI or Assessment and Plan.  Objective  Well appearing patient in no apparent distress; mood and affect are within normal limits.  A full examination was performed including scalp, head, eyes, ears, nose, lips, neck, chest, axillae, abdomen, back, buttocks, bilateral upper extremities, bilateral lower extremities, hands, feet, fingers, toes, fingernails, and toenails. All findings within normal limits unless otherwise noted below.  Face/ears (7) Erythematous thin papules/macules with gritty scale.   Scalp Erythematous keratotic or waxy stuck-on papule or plaque.    Assessment & Plan   Lentigines - Scattered tan macules - Due to sun exposure - Benign-appering, observe - Recommend daily broad spectrum sunscreen SPF 30+ to sun-exposed areas, reapply every 2 hours as needed. - Call for any changes  Seborrheic Keratoses - Stuck-on, waxy, tan-brown papules and/or plaques  - Benign-appearing - Discussed benign etiology and prognosis. - Observe - Call for any changes  Melanocytic Nevi - Tan-brown and/or pink-flesh-colored symmetric macules and papules - Benign appearing on exam today - Observation - Call clinic for new or changing moles - Recommend daily use of broad spectrum spf 30+ sunscreen to sun-exposed areas.   Hemangiomas - Red papules - Discussed benign nature - Observe - Call for any changes  Actinic  Damage - Chronic condition, secondary to cumulative UV/sun exposure - diffuse scaly erythematous macules with underlying dyspigmentation - Recommend daily broad spectrum sunscreen SPF 30+ to sun-exposed areas, reapply every 2 hours as needed.  - Staying in the shade or wearing long sleeves, sun glasses (UVA+UVB protection) and wide brim hats (4-inch brim around the entire circumference of the hat) are also recommended for sun protection.  - Call for new or changing lesions.  Skin cancer screening performed today.  History of Melanoma in Situ - No evidence of recurrence today - Recommend regular full body skin exams - Recommend daily broad spectrum sunscreen SPF 30+ to sun-exposed areas, reapply every 2 hours as needed.  - Call if any new or changing lesions are noted between office visits  AK (actinic keratosis) (7) Face/ears  Actinic Damage - Severe, confluent actinic changes with pre-cancerous actinic keratoses  - Severe, chronic, not at goal, secondary to cumulative UV radiation exposure over time - diffuse scaly erythematous macules and papules with underlying dyspigmentation - Discussed Prescription "Field Treatment" for Severe, Chronic Confluent Actinic Changes with Pre-Cancerous Actinic Keratoses Field treatment involves treatment of an entire area of skin that has confluent Actinic Changes (Sun/ Ultraviolet light damage) and PreCancerous Actinic Keratoses by method of PhotoDynamic Therapy (PDT) and/or prescription Topical Chemotherapy agents such as 5-fluorouracil, 5-fluorouracil/calcipotriene, and/or imiquimod.  The purpose is to decrease the number of clinically evident and subclinical PreCancerous lesions to prevent progression to development of skin cancer by chemically destroying early precancer changes that may or may not be visible.  It has been shown to reduce the risk of developing skin cancer in the treated  area. As a result of treatment, redness, scaling, crusting, and open  sores may occur during treatment course. One or more than one of these methods may be used and may have to be used several times to control, suppress and eliminate the PreCancerous changes. Discussed treatment course, expected reaction, and possible side effects. - Recommend daily broad spectrum sunscreen SPF 30+ to sun-exposed areas, reapply every 2 hours as needed.  - Staying in the shade or wearing long sleeves, sun glasses (UVA+UVB protection) and wide brim hats (4-inch brim around the entire circumference of the hat) are also recommended. - Call for new or changing lesions.  - Start 5-fluorouracil/calcipotriene cream twice a day for 7 days to affected areas including face. Prescription sent to [facility]. Patient advised they will receive an email to purchase the medication online and have it sent to their home. Patient provided with handout reviewing treatment course and side effects and advised to call or message [place] on MyChart with any concerns.   Destruction of lesion - Face/ears Complexity: simple   Destruction method: cryotherapy   Informed consent: discussed and consent obtained   Timeout:  patient name, date of birth, surgical site, and procedure verified Lesion destroyed using liquid nitrogen: Yes   Region frozen until ice ball extended beyond lesion: Yes   Outcome: patient tolerated procedure well with no complications   Post-procedure details: wound care instructions given    Inflamed seborrheic keratosis Scalp Destruction of lesion - Scalp Complexity: simple   Destruction method: cryotherapy   Informed consent: discussed and consent obtained   Timeout:  patient name, date of birth, surgical site, and procedure verified Lesion destroyed using liquid nitrogen: Yes   Region frozen until ice ball extended beyond lesion: Yes   Outcome: patient tolerated procedure well with no complications   Post-procedure details: wound care instructions given     Skin cancer screening  Return in about 3 months (around 02/03/2021) for TBSE.  I, Chris Johnson, CMA, am acting as scribe for Chris Ser, MD .  Documentation: I have reviewed the above documentation for accuracy and completeness, and I agree with the above.  Chris Ser, MD

## 2020-11-09 ENCOUNTER — Encounter: Payer: Self-pay | Admitting: Dermatology

## 2020-12-06 ENCOUNTER — Ambulatory Visit: Payer: Medicare Other | Admitting: Dermatology

## 2020-12-09 ENCOUNTER — Other Ambulatory Visit: Payer: Self-pay | Admitting: Cardiovascular Disease

## 2020-12-09 NOTE — Telephone Encounter (Signed)
LVM to schedule

## 2020-12-09 NOTE — Telephone Encounter (Signed)
Please schedule overdue F/U appointment with Dr. Rockey Situ. Thank you!

## 2021-01-23 ENCOUNTER — Other Ambulatory Visit: Payer: Self-pay | Admitting: Cardiovascular Disease

## 2021-01-23 DIAGNOSIS — I10 Essential (primary) hypertension: Secondary | ICD-10-CM

## 2021-01-23 MED ORDER — LOSARTAN POTASSIUM-HCTZ 100-25 MG PO TABS: 1.0000 | ORAL_TABLET | Freq: Every day | ORAL | 1 refills | Status: DC

## 2021-01-23 MED ORDER — EZETIMIBE 10 MG PO TABS: 10.0000 mg | ORAL_TABLET | Freq: Every day | ORAL | 4 refills | Status: DC

## 2021-02-09 ENCOUNTER — Ambulatory Visit: Payer: Medicare Other | Admitting: Dermatology

## 2021-02-09 ENCOUNTER — Other Ambulatory Visit: Payer: Self-pay

## 2021-02-09 DIAGNOSIS — L570 Actinic keratosis: Secondary | ICD-10-CM

## 2021-02-09 DIAGNOSIS — Z1283 Encounter for screening for malignant neoplasm of skin: Secondary | ICD-10-CM | POA: Diagnosis not present

## 2021-02-09 DIAGNOSIS — B360 Pityriasis versicolor: Secondary | ICD-10-CM

## 2021-02-09 DIAGNOSIS — D1800 Hemangioma unspecified site: Secondary | ICD-10-CM

## 2021-02-09 DIAGNOSIS — Z86006 Personal history of melanoma in-situ: Secondary | ICD-10-CM

## 2021-02-09 DIAGNOSIS — D229 Melanocytic nevi, unspecified: Secondary | ICD-10-CM

## 2021-02-09 DIAGNOSIS — L821 Other seborrheic keratosis: Secondary | ICD-10-CM

## 2021-02-09 DIAGNOSIS — L814 Other melanin hyperpigmentation: Secondary | ICD-10-CM

## 2021-02-09 DIAGNOSIS — L578 Other skin changes due to chronic exposure to nonionizing radiation: Secondary | ICD-10-CM | POA: Diagnosis not present

## 2021-02-09 DIAGNOSIS — L918 Other hypertrophic disorders of the skin: Secondary | ICD-10-CM

## 2021-02-09 MED ORDER — KETOCONAZOLE 2 % EX CREA: 1.0000 "application " | TOPICAL_CREAM | Freq: Two times a day (BID) | CUTANEOUS | 6 refills | Status: AC

## 2021-02-09 NOTE — Progress Notes (Signed)
Follow-Up Visit   Subjective  Chris Johnson is a 70 y.o. male who presents for the following: Other (History of Melanoma in situ - TBSE today). The patient has a persistent area of the right temple that they would like evaluated.  Is been treated in the past for precancers. The patient also has a rash on his thigh that itches.  He would like treatment. The patient has several spots and lesions to be evaluated.  He is concerned about skin cancer and has history of significant sun exposure and sun damage.   The patient presents for Total-Body Skin Exam (TBSE) for skin cancer screening and mole check.  Accompanied by wife who contributes to history  The following portions of the chart were reviewed this encounter and updated as appropriate:   Tobacco  Allergies  Meds  Problems  Med Hx  Surg Hx  Fam Hx     Review of Systems:  No other skin or systemic complaints except as noted in HPI or Assessment and Plan.  Objective  Well appearing patient in no apparent distress; mood and affect are within normal limits.  A full examination was performed including scalp, head, eyes, ears, nose, lips, neck, chest, axillae, abdomen, back, buttocks, bilateral upper extremities, bilateral lower extremities, hands, feet, fingers, toes, fingernails, and toenails. All findings within normal limits unless otherwise noted below.  Right Temple Erythematous thin papules/macules with gritty scale.    Assessment & Plan   History of Melanoma in Situ - No evidence of recurrence today - Recommend regular full body skin exams - Recommend daily broad spectrum sunscreen SPF 30+ to sun-exposed areas, reapply every 2 hours as needed.  - Call if any new or changing lesions are noted between office visits  Acrochordons (Skin Tags) - Fleshy, skin-colored pedunculated papules - Benign appearing.  - Observe. - If desired, they can be removed with an in office procedure that is not covered by insurance. -  Please call the clinic if you notice any new or changing lesions.  Lentigines - Scattered tan macules - Due to sun exposure - Benign-appearing, observe - Recommend daily broad spectrum sunscreen SPF 30+ to sun-exposed areas, reapply every 2 hours as needed. - Call for any changes  Seborrheic Keratoses - Stuck-on, waxy, tan-brown papules and/or plaques  - Benign-appearing - Discussed benign etiology and prognosis. - Observe - Call for any changes  Melanocytic Nevi - Tan-brown and/or pink-flesh-colored symmetric macules and papules - Benign appearing on exam today - Observation - Call clinic for new or changing moles - Recommend daily use of broad spectrum spf 30+ sunscreen to sun-exposed areas.   Hemangiomas - Red papules - Discussed benign nature - Observe - Call for any changes  Actinic Damage - Chronic condition, secondary to cumulative UV/sun exposure - diffuse scaly erythematous macules with underlying dyspigmentation - Recommend daily broad spectrum sunscreen SPF 30+ to sun-exposed areas, reapply every 2 hours as needed.  - Staying in the shade or wearing long sleeves, sun glasses (UVA+UVB protection) and wide brim hats (4-inch brim around the entire circumference of the hat) are also recommended for sun protection.  - Call for new or changing lesions.  Skin cancer screening performed today.  AK (actinic keratosis) Right Temple  Actinic Damage - Severe, confluent actinic changes with pre-cancerous actinic keratoses  - Severe, chronic, not at goal, secondary to cumulative UV radiation exposure over time - diffuse scaly erythematous macules and papules with underlying dyspigmentation - Discussed Prescription "Field Treatment" for Severe, Chronic  Confluent Actinic Changes with Pre-Cancerous Actinic Keratoses Field treatment involves treatment of an entire area of skin that has confluent Actinic Changes (Sun/ Ultraviolet light damage) and PreCancerous Actinic Keratoses  by method of PhotoDynamic Therapy (PDT) and/or prescription Topical Chemotherapy agents such as 5-fluorouracil, 5-fluorouracil/calcipotriene, and/or imiquimod.  The purpose is to decrease the number of clinically evident and subclinical PreCancerous lesions to prevent progression to development of skin cancer by chemically destroying early precancer changes that may or may not be visible.  It has been shown to reduce the risk of developing skin cancer in the treated area. As a result of treatment, redness, scaling, crusting, and open sores may occur during treatment course. One or more than one of these methods may be used and may have to be used several times to control, suppress and eliminate the PreCancerous changes. Discussed treatment course, expected reaction, and possible side effects. - Recommend daily broad spectrum sunscreen SPF 30+ to sun-exposed areas, reapply every 2 hours as needed.  - Staying in the shade or wearing long sleeves, sun glasses (UVA+UVB protection) and wide brim hats (4-inch brim around the entire circumference of the hat) are also recommended. - Call for new or changing lesions.  Start Fluorouracil/Calcipotriene cream bid x 7 days to scalp and right temple  Destruction of lesion - Right Temple Complexity: simple   Destruction method: cryotherapy   Informed consent: discussed and consent obtained   Timeout:  patient name, date of birth, surgical site, and procedure verified Lesion destroyed using liquid nitrogen: Yes   Region frozen until ice ball extended beyond lesion: Yes   Outcome: patient tolerated procedure well with no complications   Post-procedure details: wound care instructions given    Tinea versicolor Right Thigh - Anterior Tinea versicolor is a chronic recurrent skin rash causing discolored scaly spots most commonly seen on back, chest, and/or shoulders.  It is generally asymptomatic. The rash is due to overgrowth of a common type of yeast present on  everyone's skin and it is not contagious.  It tends to flare more in the summer due to increased sweating on trunk.  After rash is treated, the scaliness will resolve, but the discoloration will take longer to return to normal pigmentation. The periodic use of an OTC medicated soap/shampoo with zinc or selenium sulfide can be helpful to prevent yeast overgrowth and recurrence.  ketoconazole (NIZORAL) 2 % cream - Right Thigh - Anterior Apply 1 application topically 2 (two) times daily.  Return in about 3 months (around 05/12/2021) for TBSE.  I, Ashok Cordia, CMA, am acting as scribe for [name], MD . Documentation: I have reviewed the above documentation for accuracy and completeness, and I agree with the above.  [name], MD

## 2021-02-09 NOTE — Patient Instructions (Signed)

## 2021-02-22 ENCOUNTER — Encounter: Payer: Self-pay | Admitting: Dermatology

## 2021-05-13 ENCOUNTER — Other Ambulatory Visit: Payer: Self-pay

## 2021-05-18 ENCOUNTER — Ambulatory Visit: Payer: Self-pay | Admitting: Urology

## 2021-05-30 ENCOUNTER — Other Ambulatory Visit: Payer: Self-pay

## 2021-05-30 ENCOUNTER — Ambulatory Visit: Payer: Medicare Other | Admitting: Dermatology

## 2021-05-30 ENCOUNTER — Encounter: Payer: Self-pay | Admitting: Dermatology

## 2021-05-30 ENCOUNTER — Other Ambulatory Visit: Payer: Medicare Other

## 2021-05-30 DIAGNOSIS — L814 Other melanin hyperpigmentation: Secondary | ICD-10-CM

## 2021-05-30 DIAGNOSIS — Z1283 Encounter for screening for malignant neoplasm of skin: Secondary | ICD-10-CM | POA: Diagnosis not present

## 2021-05-30 DIAGNOSIS — Z125 Encounter for screening for malignant neoplasm of prostate: Secondary | ICD-10-CM

## 2021-05-30 DIAGNOSIS — Z872 Personal history of diseases of the skin and subcutaneous tissue: Secondary | ICD-10-CM

## 2021-05-30 DIAGNOSIS — B360 Pityriasis versicolor: Secondary | ICD-10-CM | POA: Diagnosis not present

## 2021-05-30 DIAGNOSIS — L578 Other skin changes due to chronic exposure to nonionizing radiation: Secondary | ICD-10-CM | POA: Diagnosis not present

## 2021-05-30 DIAGNOSIS — D1800 Hemangioma unspecified site: Secondary | ICD-10-CM

## 2021-05-30 DIAGNOSIS — L821 Other seborrheic keratosis: Secondary | ICD-10-CM

## 2021-05-30 DIAGNOSIS — Z86006 Personal history of melanoma in-situ: Secondary | ICD-10-CM | POA: Diagnosis not present

## 2021-05-30 DIAGNOSIS — D229 Melanocytic nevi, unspecified: Secondary | ICD-10-CM

## 2021-05-30 NOTE — Patient Instructions (Signed)
Recommend daily broad spectrum sunscreen SPF 30+ to sun-exposed areas, reapply every 2 hours as needed. Call for new or changing lesions.  Staying in the shade or wearing long sleeves, sun glasses (UVA+UVB protection) and wide brim hats (4-inch brim around the entire circumference of the hat) are also recommended for sun protection.    Melanoma ABCDEs  Melanoma is the most dangerous type of skin cancer, and is the leading cause of death from skin disease.  You are more likely to develop melanoma if you: Have light-colored skin, light-colored eyes, or red or blond hair Spend a lot of time in the sun Tan regularly, either outdoors or in a tanning bed Have had blistering sunburns, especially during childhood Have a close family member who has had a melanoma Have atypical moles or large birthmarks  Early detection of melanoma is key since treatment is typically straightforward and cure rates are extremely high if we catch it early.   The first sign of melanoma is often a change in a mole or a new dark spot.  The ABCDE system is a way of remembering the signs of melanoma.  A for asymmetry:  The two halves do not match. B for border:  The edges of the growth are irregular. C for color:  A mixture of colors are present instead of an even brown color. D for diameter:  Melanomas are usually (but not always) greater than 67m - the size of a pencil eraser. E for evolution:  The spot keeps changing in size, shape, and color.  Please check your skin once per month between visits. You can use a small mirror in front and a large mirror behind you to keep an eye on the back side or your body.   If you see any new or changing lesions before your next follow-up, please call to schedule a visit.  Please continue daily skin protection including broad spectrum sunscreen SPF 30+ to sun-exposed areas, reapplying every 2 hours as needed when you're outdoors.   Staying in the shade or wearing long sleeves, sun  glasses (UVA+UVB protection) and wide brim hats (4-inch brim around the entire circumference of the hat) are also recommended for sun protection.     If You Need Anything After Your Visit  If you have any questions or concerns for your doctor, please call our main line at [phone] press option 4 to reach your doctor's medical assistant. If no one answers, please leave a voicemail as directed and we will return your call as soon as possible. Messages left after 4 pm will be answered the following business day.   You may also send [place] message via [place] We typically respond to MyChart messages within 1-2 business days.  For prescription refills, please ask your pharmacy to contact our office. Our fax number is [phone]  If you have an urgent issue when the clinic is closed that cannot wait until the next business day, you can page your doctor at the number below.    Please note that while we do our best to be available for urgent issues outside of office hours, we are not available 24/7.   If you have an urgent issue and are unable to reach [place] you may choose to seek medical care at your doctor's office, retail clinic, urgent care center, or emergency room.  If you have a medical emergency, please immediately call 911 or go to the emergency department.  Pager Numbers  - Dr. KNehemiah Massed [phone] -  Dr. Laurence Ferrari: [phone]  - Dr. Nicole Kindred: [phone]  In the event of inclement weather, please call our main line at [phone] for an update on the status of any delays or closures.  Dermatology Medication Tips: Please keep the boxes that topical medications come in in order to help keep track of the instructions about where and how to use these. Pharmacies typically print the medication instructions only on the boxes and not directly on the medication tubes.   If your medication is too expensive, please contact our office at [phone] option 4 or send [place] a message  through [place].   We are unable to tell what your co-pay for medications will be in advance as this is different depending on your insurance coverage. However, we may be able to find a substitute medication at lower cost or fill out paperwork to get insurance to cover a needed medication.   If a prior authorization is required to get your medication covered by your insurance company, please allow [place] 1-2 business days to complete this process.  Drug prices often vary depending on where the prescription is filled and some pharmacies may offer cheaper prices.  The website www.goodrx.com contains coupons for medications through different pharmacies. The prices here do not account for what the cost may be with help from insurance (it may be cheaper with your insurance), but the website can give you the price if you did not use any insurance.  - You can print the associated coupon and take it with your prescription to the pharmacy.  - You may also stop by our office during regular business hours and pick up a GoodRx coupon card.  - If you need your prescription sent electronically to a different pharmacy, notify our office through [facility] or by phone at [phone] option 4.     Si Usted Necesita Algo Despus de Su Visita  Tambin puede enviarnos un mensaje a travs de Pharmacist, community. Por lo general respondemos a los mensajes de MyChart en el transcurso de 1 a 2 das hbiles.  Para renovar recetas, por favor pida a su farmacia que se ponga en contacto con nuestra oficina. Harland Dingwall de fax es [place] [phone].  Si tiene un asunto urgente cuando la clnica est cerrada y que no puede esperar hasta el siguiente da hbil, puede llamar/localizar a su doctor(a) al nmero que aparece a continuacin.   Por favor, tenga en cuenta que aunque hacemos todo lo posible para estar disponibles para asuntos urgentes fuera del horario de [place], no estamos disponibles las 24 horas del da, los 7 das de  la [place].   Si tiene un problema urgente y no puede comunicarse con nosotros, puede optar por buscar atencin mdica  en el consultorio de su doctor(a), en una clnica privada, en un centro de atencin urgente o en una sala de emergencias.  Si tiene Engineering geologist, por favor llame inmediatamente al 911 o vaya a la sala de emergencias.  Nmeros de bper  - Dr. Nehemiah Massed: [phone]  - Dra. Moye: [phone]  - Dra. Nicole Kindred: [phone]  En caso de inclemencias del [place], por favor llame a Johnsie Kindred principal al [phone] para una actualizacin sobre el [place] de cualquier retraso o cierre.  Consejos para la medicacin en dermatologa: Por favor, guarde las cajas en las que vienen los medicamentos de uso tpico para ayudarle a seguir las instrucciones sobre dnde y cmo usarlos. Las farmacias generalmente imprimen las instrucciones del medicamento slo en las cajas y  no directamente en los tubos del medicamento.   Si su medicamento es muy caro, por favor, pngase en contacto con Zigmund Daniel llamando al [phone] y presione la opcin 4 o envenos un mensaje a travs de Pharmacist, community.   No podemos decirle cul ser su copago por los medicamentos por adelantado ya que esto es diferente dependiendo de la cobertura de su seguro. Sin embargo, es posible que podamos encontrar un medicamento sustituto a Electrical engineer un formulario para que el seguro cubra el medicamento que se considera necesario.   Si se requiere una autorizacin previa para que su compaa de seguros [place] su medicamento, por favor permtanos de 1 a 2 das hbiles para completar este proceso.  Los precios de los medicamentos varan con frecuencia dependiendo del Environmental consultant de dnde se surte la receta y alguna farmacias pueden ofrecer precios ms baratos.  El sitio web www.goodrx.com tiene cupones para medicamentos de Airline pilot. Los precios aqu no tienen en cuenta lo que podra costar con la ayuda  del seguro (puede ser ms barato con su seguro), pero el sitio web puede darle el precio si no utiliz Research scientist (physical sciences).  - Puede imprimir el cupn correspondiente y llevarlo con su receta a la farmacia.  - Tambin puede pasar por nuestra oficina durante el horario de atencin regular y Charity fundraiser una tarjeta de cupones de GoodRx.  - Si necesita que su receta se enve electrnicamente a una farmacia diferente, informe a nuestra oficina a travs de MyChart de [place] o por telfono llamando al [phone] y presione la opcin 4.

## 2021-05-30 NOTE — Progress Notes (Signed)
? ?  Follow-Up Visit ?  ?Subjective  ?Chris Johnson is a 71 y.o. male who presents for the following: Annual Exam (Here for skin cancer screening. Full body. HxMIS 2022. Hx of AK's). ?Wife with patient.  ?The patient presents for Total-Body Skin Exam (TBSE) for skin cancer screening and mole check.  The patient has spots, moles and lesions to be evaluated, some may be new or changing and the patient has concerns that these could be cancer. ? ?The following portions of the chart were reviewed this encounter and updated as appropriate:  Tobacco  Allergies  Meds  Problems  Med Hx  Surg Hx  Fam Hx   ?  ?Review of Systems: No other skin or systemic complaints except as noted in HPI or Assessment and Plan. ? ?Objective  ?Well appearing patient in no apparent distress; mood and affect are within normal limits. ? ?A full examination was performed including scalp, head, eyes, ears, nose, lips, neck, chest, axillae, abdomen, back, buttocks, bilateral upper extremities, bilateral lower extremities, hands, feet, fingers, toes, fingernails, and toenails. All findings within normal limits unless otherwise noted below. ? ?Right Thigh - Anterior ?Scaly discoloration  ? ? ?Assessment & Plan  ? ?History of Melanoma in Situ. Excised 07/20/2020 ?- No evidence of recurrence today at left lateral scapula ?-No lymphadenopathy ?- Recommend regular full body skin exams ?- Recommend daily broad spectrum sunscreen SPF 30+ to sun-exposed areas, reapply every 2 hours as needed.  ?- Call if any new or changing lesions are noted between office visits  ? ?Lentigines ?- Scattered tan macules ?- Due to sun exposure ?- Benign-appearing, observe ?- Recommend daily broad spectrum sunscreen SPF 30+ to sun-exposed areas, reapply every 2 hours as needed. ?- Call for any changes ? ?Seborrheic Keratoses ?- Stuck-on, waxy, tan-brown papules and/or plaques  ?- Benign-appearing ?- Discussed benign etiology and prognosis. ?- Observe ?- Call for any  changes ? ?Melanocytic Nevi ?- Tan-brown and/or pink-flesh-colored symmetric macules and papules ?- Benign appearing on exam today ?- Observation ?- Call clinic for new or changing moles ?- Recommend daily use of broad spectrum spf 30+ sunscreen to sun-exposed areas.  ? ?Hemangiomas ?- Red papules ?- Discussed benign nature ?- Observe ?- Call for any changes ? ?Actinic Damage ?- Chronic condition, secondary to cumulative UV/sun exposure ?- diffuse scaly erythematous macules with underlying dyspigmentation ?- Recommend daily broad spectrum sunscreen SPF 30+ to sun-exposed areas, reapply every 2 hours as needed.  ?- Staying in the shade or wearing long sleeves, sun glasses (UVA+UVB protection) and wide brim hats (4-inch brim around the entire circumference of the hat) are also recommended for sun protection.  ?- Call for new or changing lesions. ? ?Skin cancer screening performed today. ? ?Tinea versicolor ?Right Thigh - Anterior ?Resume Ketoconazole 2% cream BID as directed ?Recheck next visit.  May be a very light-colored caf? au lait macule ?Benign-appearing.  Observation.  Call clinic for new or changing lesions.  Recommend daily use of broad spectrum spf 30+ sunscreen to sun-exposed areas.  ? ?Return in about 4 months (around 09/29/2021) for TBSE. ? ?I, Emelia Salisbury, CMA, am acting as scribe for [name], MD. ?Documentation: I have reviewed the above documentation for accuracy and completeness, and I agree with the above. ? ?[name], MD ? ? ?

## 2021-06-02 ENCOUNTER — Ambulatory Visit: Payer: Medicare Other | Admitting: Urology

## 2021-07-07 NOTE — Telephone Encounter (Signed)
Attempted to schedule.  LMOV to call office.  ° °

## 2021-07-08 NOTE — Telephone Encounter (Signed)
Attempted to schedule.  LMOV to call office.  ° °

## 2021-07-14 ENCOUNTER — Other Ambulatory Visit (HOSPITAL_COMMUNITY): Payer: Self-pay | Admitting: Student

## 2021-07-14 ENCOUNTER — Other Ambulatory Visit: Payer: Self-pay | Admitting: Student

## 2021-07-14 DIAGNOSIS — R4189 Other symptoms and signs involving cognitive functions and awareness: Secondary | ICD-10-CM

## 2021-08-01 ENCOUNTER — Ambulatory Visit (HOSPITAL_COMMUNITY)
Admission: RE | Admit: 2021-08-01 | Discharge: 2021-08-01 | Disposition: A | Discharge status: Home / Self Care | Payer: Medicare Other | Source: Ambulatory Visit | Attending: Student | Admitting: Student

## 2021-08-01 DIAGNOSIS — R4189 Other symptoms and signs involving cognitive functions and awareness: Secondary | ICD-10-CM | POA: Diagnosis present

## 2021-08-23 ENCOUNTER — Other Ambulatory Visit: Payer: Self-pay | Admitting: Cardiovascular Disease

## 2021-08-23 DIAGNOSIS — I10 Essential (primary) hypertension: Secondary | ICD-10-CM

## 2021-09-13 NOTE — Progress Notes (Unsigned)
Date:  09/14/2021   ID:  Chris Johnson, DOB [birth year], MRN [id number]  Patient Location:  [place] [address]   Provider location:   [facility], [place] office  PCP:  Ranae Plumber, [place]  Cardiologist:  Arvid Right [facility]  Chief Complaint  Patient presents with   Follow-up    Patient c/o dizziness with blood pressure being decreased; patient has since decreased his Losartan HCTZ from 100-25 mg to taking 1/2 tablet daily. Medications reviewed by the patient verbally.      History of Present Illness:    Chris Johnson is a 71 y.o. male  past medical history of Gout Hypertension Hyperlipidemia  mild bilateral carotid disease by ultrasound September 2019 Chronic leg pain Chronic renal dysfunction Calcium score of 176 Carotid Doppler 11/2017 with bilateral 1-39% stenosis lower extremity duplex 04/2018 with triphasic waveforms  Who presents to establish care in the [place] office for his hypertension  Last seen by myself on telemetry visit 2020 Seen by one of our providers August 2021  On losartan 100/25 daily, reports blood pressure was too low, was having orthostasis symptoms have started half pill 50/12.5 daily Even on the half pill reports having orthostasis, feels dizzy when standing up Orthostatics numbers today blood pressure down to 116/75 with standing Not on amlodipine  Trying to stay active, refurbishing a horse trailer Legs still hurting, feet neuropathy Parkinsons  " Legs blew up" on Lyrica, cause severe pain  Off crestor, too many pills and given his leg pain leg pain is chronic on tramadol  Prior carotid ultrasound 2019 with minimal plaque Calcium scoring 176  EKG personally reviewed by myself on todays visit Normal sinus rhythm with rate 62 bpm no significant ST-T wave changes   Imaging reviewed showing mild carotid disease, coronary calcium score 170    Past Medical History:  Diagnosis Date    Actinic keratosis 06/16/2020   L post deltoid, LN2 07/27/2020   Arthritis    Gout 10/25/2017   Hyperlipidemia    Hypertension    Melanoma in situ ([place]) 06/22/2020   L mid lat scapula, excised 07/20/20   Normal cardiac stress test 1995   cardiolyte   Past Surgical History:  Procedure Laterality Date   colonoscopy     VARICOSE VEIN SURGERY      Current Meds  Medication Sig   baclofen (LIORESAL) 10 MG tablet Take 2 tablets (20 mg total) by mouth 2 (two) times daily.   carbidopa-levodopa (SINEMET IR) 25-100 MG tablet Take 1 tablet by mouth 3 (three) times daily. Two tablets in the am, one tablet at noon and 2 tablets in the pm,   ezetimibe (ZETIA) 10 MG tablet Take 1 tablet (10 mg total) by mouth daily.   losartan-hydrochlorothiazide (HYZAAR) 100-25 MG tablet Take one-half tablet daily.   pregabalin (LYRICA) 50 MG capsule Take 1-2 capsules (50-100 mg total) by mouth 3 (three) times daily. Continue to titrate dose up as instructed.   traMADol (ULTRAM) 50 MG tablet Take 50-100 mg by mouth 4 (four) times daily as needed.   zolpidem (AMBIEN CR) 12.5 MG CR tablet Take 12.5 mg by mouth at bedtime as needed for sleep.     Allergies:   Atorvastatin, Celexa [citalopram hydrobromide], Citalopram, and Other   Social History   Tobacco Use   Smoking status: Never   Smokeless tobacco: Never  Vaping Use   Vaping Use: Never used  Substance Use Topics   Alcohol use:  Yes    Alcohol/week: 5.0 standard drinks of alcohol    Types: 5 Standard drinks or equivalent per week    Comment: Occasional   Drug use: No     Current Outpatient Medications on File Prior to Visit  Medication Sig Dispense Refill   baclofen (LIORESAL) 10 MG tablet Take 2 tablets (20 mg total) by mouth 2 (two) times daily. 120 tablet 2   carbidopa-levodopa (SINEMET IR) 25-100 MG tablet Take 1 tablet by mouth 3 (three) times daily. Two tablets in the am, one tablet at noon and 2 tablets in the pm,     ezetimibe (ZETIA) 10 MG  tablet Take 1 tablet (10 mg total) by mouth daily. 90 tablet 4   losartan-hydrochlorothiazide (HYZAAR) 100-25 MG tablet Take one-half tablet daily.     pregabalin (LYRICA) 50 MG capsule Take 1-2 capsules (50-100 mg total) by mouth 3 (three) times daily. Continue to titrate dose up as instructed. 180 capsule 2   traMADol (ULTRAM) 50 MG tablet Take 50-100 mg by mouth 4 (four) times daily as needed.     zolpidem (AMBIEN CR) 12.5 MG CR tablet Take 12.5 mg by mouth at bedtime as needed for sleep.     allopurinol (ZYLOPRIM) 100 MG tablet Take 200 mg by mouth daily. (Patient not taking: Reported on 09/14/2021)     colchicine 0.6 MG tablet Take 0.6 mg by mouth 2 (two) times daily as needed.  (Patient not taking: Reported on 09/14/2021)     cyanocobalamin (,VITAMIN B-12,) 1000 MCG/ML injection Inject 1 mL into muscle once a week for 3 weeks then once a month for 4 months. (Patient not taking: Reported on 09/14/2021)     Efinaconazole (JUBLIA) 10 % SOLN Apply to the toenails QHS (Patient not taking: Reported on 09/14/2021) 8 mL 6   ketoconazole (NIZORAL) 2 % cream Apply to the feet QHS. (Patient not taking: Reported on 09/14/2021) 60 g 6   mupirocin ointment (BACTROBAN) 2 % Apply 1 application topically daily. Qd to excision site (Patient not taking: Reported on 09/14/2021) 22 g 1   No current facility-administered medications on file prior to visit.     Family Hx: The patient's family history includes Alcohol abuse in his mother; Arthritis in his father; Breast cancer in his sister; Heart attack in his mother; Hypertension in his mother; Prostate cancer in his father; Skin cancer in his mother; Stroke in his mother.  ROS:   Please see the history of present illness.    Review of Systems  Constitutional: Negative.   Respiratory: Negative.    Cardiovascular: Negative.   Gastrointestinal: Negative.   Musculoskeletal: Negative.   Neurological: Negative.   Psychiatric/Behavioral: Negative.    All other  systems reviewed and are negative.    Labs/Other Tests and Data Reviewed:    Recent Labs: No results found for requested labs within last 365 days.   Recent Lipid Panel Lab Results  Component Value Date/Time   CHOL 238 (H) 12/14/2015 02:59 PM   TRIG 288.0 (H) 12/14/2015 02:59 PM   HDL 38.80 (L) 12/14/2015 02:59 PM   CHOLHDL 6 12/14/2015 02:59 PM   LDLCALC 128 (H) 06/23/2013 09:27 AM   LDLDIRECT 144.0 12/14/2015 02:59 PM    Wt Readings from Last 3 Encounters:  09/14/21 187 lb 4 oz (84.9 kg)  05/13/20 190 lb (86.2 kg)  03/15/20 182 lb (82.6 kg)     Exam:    Vital Signs: Vital signs may also be detailed in the HPI BP  118/64 (BP Location: Left Arm, Patient Position: Sitting, Cuff Size: Normal)   Pulse 62   Ht '5\' 11"'$  (1.803 m)   Wt 187 lb 4 oz (84.9 kg)   SpO2 96%   BMI 26.12 kg/m   Constitutional:  oriented to person, place, and time. No distress.  HENT:  Head: Grossly normal Eyes:  no discharge. No scleral icterus.  Neck: No JVD, no carotid bruits  Cardiovascular: Regular rate and rhythm, no murmurs appreciated Pulmonary/Chest: Clear to auscultation bilaterally, no wheezes or rails Abdominal: Soft.  no distension.  no tenderness.  Musculoskeletal: Normal range of motion Neurological:  normal muscle tone. Coordination normal. No atrophy Skin: Skin warm and dry Psychiatric: normal affect, pleasant  ASSESSMENT & PLAN:    Essential hypertension -  Blood pressure running low on losartan HCTZ 50/12.5 daily  having orthostasis symptoms Recommended he stop the medication and start losartan 25 daily Lab work today  Renal insufficiency We have ordered BMP We will hold the HCTZ and decrease losartan given orthostasis  Chronic pain syndrome - Leg pain, followed by the pain clinic Has neuropathy  Mixed hyperlipidemia -  Continue Zetia 10 mg daily We will avoid a statin given his chronic leg pain   Total encounter time more than 30 minutes  Greater than 50% was  spent in counseling and coordination of care with the patient   Signed, Ida Rogue, MD  09/14/2021 11:04 AM    [facility] [address], [place], [place] [address]

## 2021-09-14 ENCOUNTER — Encounter: Payer: Self-pay | Admitting: Cardiovascular Disease

## 2021-09-14 ENCOUNTER — Ambulatory Visit: Payer: Medicare Other | Admitting: Cardiovascular Disease

## 2021-09-14 ENCOUNTER — Other Ambulatory Visit
Admission: RE | Admit: 2021-09-14 | Discharge: 2021-09-14 | Disposition: A | Discharge status: Home / Self Care | Payer: Medicare Other | Source: Ambulatory Visit | Attending: Cardiovascular Disease | Admitting: Cardiovascular Disease

## 2021-09-14 VITALS — BP 118/64 | HR 62 | Ht 71.0 in | Wt 187.2 lb

## 2021-09-14 DIAGNOSIS — G608 Other hereditary and idiopathic neuropathies: Secondary | ICD-10-CM | POA: Diagnosis present

## 2021-09-14 DIAGNOSIS — G20 Parkinson's disease: Secondary | ICD-10-CM | POA: Diagnosis not present

## 2021-09-14 DIAGNOSIS — I10 Essential (primary) hypertension: Secondary | ICD-10-CM

## 2021-09-14 DIAGNOSIS — E782 Mixed hyperlipidemia: Secondary | ICD-10-CM | POA: Diagnosis present

## 2021-09-14 DIAGNOSIS — I2510 Atherosclerotic heart disease of native coronary artery without angina pectoris: Secondary | ICD-10-CM | POA: Diagnosis not present

## 2021-09-14 LAB — CBC
HCT: 46.8 % (ref 39.0–52.0)
Hemoglobin: 15.7 g/dL (ref 13.0–17.0)
MCH: 28.4 pg (ref 26.0–34.0)
MCHC: 33.5 g/dL (ref 30.0–36.0)
MCV: 84.8 fL (ref 80.0–100.0)
Platelets: 231 10*3/uL (ref 150–400)
RBC: 5.52 MIL/uL (ref 4.22–5.81)
RDW: 13.2 % (ref 11.5–15.5)
WBC: 6 10*3/uL (ref 4.0–10.5)
nRBC: 0 % (ref 0.0–0.2)

## 2021-09-14 LAB — COMPREHENSIVE METABOLIC PANEL
ALT: 5 U/L (ref 0–44)
AST: 16 U/L (ref 15–41)
Albumin: 4.2 g/dL (ref 3.5–5.0)
Alkaline Phosphatase: 56 U/L (ref 38–126)
Anion gap: 5 (ref 5–15)
BUN: 18 mg/dL (ref 8–23)
CO2: 31 mmol/L (ref 22–32)
Calcium: 9.7 mg/dL (ref 8.9–10.3)
Chloride: 103 mmol/L (ref 98–111)
Creatinine, Ser: 1.21 mg/dL (ref 0.61–1.24)
GFR, Estimated: >60 mL/min (ref >60)
Glucose, Bld: 106 mg/dL — ABNORMAL HIGH (ref 70–99)
Potassium: 3.3 mmol/L — ABNORMAL LOW (ref 3.5–5.1)
Sodium: 139 mmol/L (ref 135–145)
Total Bilirubin: 0.7 mg/dL (ref 0.3–1.2)
Total Protein: 7.8 g/dL (ref 6.5–8.1)

## 2021-09-14 LAB — LIPID PANEL
Cholesterol: 188 mg/dL (ref 0–200)
HDL: 38 mg/dL — ABNORMAL LOW (ref >40)
LDL Cholesterol: 95 mg/dL (ref 0–99)
Total CHOL/HDL Ratio: 4.9 RATIO
Triglycerides: 277 mg/dL — ABNORMAL HIGH (ref <150)
VLDL: 55 mg/dL — ABNORMAL HIGH (ref 0–40)

## 2021-09-14 LAB — VITAMIN B12: Vitamin B-12: 343 pg/mL (ref 180–914)

## 2021-09-14 MED ORDER — EZETIMIBE 10 MG PO TABS: 10.0000 mg | ORAL_TABLET | Freq: Every day | ORAL | 4 refills | Status: DC

## 2021-09-14 MED ORDER — LOSARTAN POTASSIUM 25 MG PO TABS: 25.0000 mg | ORAL_TABLET | Freq: Every day | ORAL | 4 refills | Status: DC

## 2021-09-14 NOTE — Patient Instructions (Addendum)
Medication Instructions:  Please stop the losartan HCTZ Start losartan 25 one a day  If you need a refill on your cardiac medications before your next appointment, please call your pharmacy.   Lab work:  Today: CMP, b12, lipids, CBC  Medical Mall Entrance at [facility] 1st desk on the right to check in (REGISTRATION)  Lab hours: Monday- Friday (7:30 am- 5:30 pm)   Testing/Procedures: No new testing needed  Follow-Up: At [facility], you and your health needs are our priority.  As part of our continuing mission to provide you with exceptional heart care, we have created designated Provider Care Teams.  These Care Teams include your primary Cardiologist (physician) and Advanced Practice Providers (APPs -  Physician Assistants and Nurse Practitioners) who all work together to provide you with the care you need, when you need it.  You will need a follow up appointment in 12 months  Providers on your designated Care Team:   Murray Hodgkins, NP Christell Faith, PA-C Cadence Kathlen Mody, [place]  COVID-19 Vaccine Information can be found at: [email or web address].co.uk For questions related to vaccine distribution or appointments, please email [email or web address] or call [phone].

## 2021-09-15 ENCOUNTER — Telehealth: Payer: Self-pay | Admitting: *Deleted

## 2021-09-15 NOTE — Telephone Encounter (Signed)
-----   Message from Minna Merritts, MD sent at 09/15/2021  8:11 AM EDT ----- Labs reviewed Potassium littlle llow I stopped his HCTZ which is likely cause.  He can eat 2-3 bananas or increase diet intake of potassium. it should correct on its own Cholesterol little better on zetia (would stay on it). May need to think of other strategy to get it a little lower. I will get back in touch with him

## 2021-09-15 NOTE — Telephone Encounter (Signed)
Pt returned call. Notified of lab results and Dr. Donivan Scull recc below.   Pt voiced understanding. Pt will incr potassium in his diet; reviewed potassium rich foods with pt.   Pt has no further questions at this time.

## 2021-09-15 NOTE — Telephone Encounter (Signed)
Attempted to call pt. No answer. Lmtcb.  

## 2021-10-05 ENCOUNTER — Other Ambulatory Visit: Payer: Self-pay | Admitting: Cardiovascular Disease

## 2021-10-05 ENCOUNTER — Ambulatory Visit: Payer: Medicare Other | Admitting: Dermatology

## 2021-10-05 DIAGNOSIS — L918 Other hypertrophic disorders of the skin: Secondary | ICD-10-CM

## 2021-10-05 DIAGNOSIS — L578 Other skin changes due to chronic exposure to nonionizing radiation: Secondary | ICD-10-CM | POA: Diagnosis not present

## 2021-10-05 DIAGNOSIS — L570 Actinic keratosis: Secondary | ICD-10-CM | POA: Diagnosis not present

## 2021-10-05 DIAGNOSIS — Z1283 Encounter for screening for malignant neoplasm of skin: Secondary | ICD-10-CM | POA: Diagnosis not present

## 2021-10-05 DIAGNOSIS — Z86006 Personal history of melanoma in-situ: Secondary | ICD-10-CM | POA: Diagnosis not present

## 2021-10-05 DIAGNOSIS — L820 Inflamed seborrheic keratosis: Secondary | ICD-10-CM

## 2021-10-05 DIAGNOSIS — I10 Essential (primary) hypertension: Secondary | ICD-10-CM

## 2021-10-05 MED ORDER — FLUOROURACIL 5 % EX CREA: TOPICAL_CREAM | CUTANEOUS | 3 refills | Status: DC

## 2021-10-05 NOTE — Progress Notes (Signed)
Follow-Up Visit   Subjective  Chris Johnson is a 71 y.o. male who presents for the following: Annual Exam (Hx MMIS, AK's ). The patient presents for Total-Body Skin Exam (TBSE) for skin cancer screening and mole check.  The patient has spots, moles and lesions to be evaluated, some may be new or changing   The following portions of the chart were reviewed this encounter and updated as appropriate:   Tobacco  Allergies  Meds  Problems  Med Hx  Surg Hx  Fam Hx     Review of Systems:  No other skin or systemic complaints except as noted in HPI or Assessment and Plan.  Objective  Well appearing patient in no apparent distress; mood and affect are within normal limits.  A full examination was performed including scalp, head, eyes, ears, nose, lips, neck, chest, axillae, abdomen, back, buttocks, bilateral upper extremities, bilateral lower extremities, hands, feet, fingers, toes, fingernails, and toenails. All findings within normal limits unless otherwise noted below.  L knee x 1, L tricep x 1 (2) Erythematous stuck-on, waxy papule or plaque  Face x 3 (3) Erythematous thin papules/macules with gritty scale.    Assessment & Plan  Inflamed seborrheic keratosis (2) L knee x 1, L tricep x 1  Destruction of lesion - L knee x 1, L tricep x 1 Complexity: simple   Destruction method: cryotherapy   Informed consent: discussed and consent obtained   Timeout:  patient name, date of birth, surgical site, and procedure verified Lesion destroyed using liquid nitrogen: Yes   Region frozen until ice ball extended beyond lesion: Yes   Outcome: patient tolerated procedure well with no complications   Post-procedure details: wound care instructions given    AK (actinic keratosis) (3) Face x 3  Destruction of lesion - Face x 3 Complexity: simple   Destruction method: cryotherapy   Informed consent: discussed and consent obtained   Timeout:  patient name, date of birth, surgical site,  and procedure verified Lesion destroyed using liquid nitrogen: Yes   Region frozen until ice ball extended beyond lesion: Yes   Outcome: patient tolerated procedure well with no complications   Post-procedure details: wound care instructions given    fluorouracil (EFUDEX) 5 % cream - Face x 3 Apply to aa's BID x 7 days  Skin cancer screening  Actinic skin damage  Lentigines - Scattered tan macules - Due to sun exposure - Benign-appearing, observe - Recommend daily broad spectrum sunscreen SPF 30+ to sun-exposed areas, reapply every 2 hours as needed. - Call for any changes  Seborrheic Keratoses - Stuck-on, waxy, tan-brown papules and/or plaques  - Benign-appearing - Discussed benign etiology and prognosis. - Observe - Call for any changes  Melanocytic Nevi - Tan-brown and/or pink-flesh-colored symmetric macules and papules - Benign appearing on exam today - Observation - Call clinic for new or changing moles - Recommend daily use of broad spectrum spf 30+ sunscreen to sun-exposed areas.   Hemangiomas - Red papules - Discussed benign nature - Observe - Call for any changes  Actinic Damage - Severe, confluent actinic changes with pre-cancerous actinic keratoses  - Severe, chronic, not at goal, secondary to cumulative UV radiation exposure over time - diffuse scaly erythematous macules and papules with underlying dyspigmentation - Discussed Prescription "Field Treatment" for Severe, Chronic Confluent Actinic Changes with Pre-Cancerous Actinic Keratoses Field treatment involves treatment of an entire area of skin that has confluent Actinic Changes (Sun/ Ultraviolet light damage) and PreCancerous Actinic Keratoses by  method of PhotoDynamic Therapy (PDT) and/or prescription Topical Chemotherapy agents such as 5-fluorouracil, 5-fluorouracil/calcipotriene, and/or imiquimod.  The purpose is to decrease the number of clinically evident and subclinical PreCancerous lesions to prevent  progression to development of skin cancer by chemically destroying early precancer changes that may or may not be visible.  It has been shown to reduce the risk of developing skin cancer in the treated area. As a result of treatment, redness, scaling, crusting, and open sores may occur during treatment course. One or more than one of these methods may be used and may have to be used several times to control, suppress and eliminate the PreCancerous changes. Discussed treatment course, expected reaction, and possible side effects. - Recommend daily broad spectrum sunscreen SPF 30+ to sun-exposed areas, reapply every 2 hours as needed.  - Staying in the shade or wearing long sleeves, sun glasses (UVA+UVB protection) and wide brim hats (4-inch brim around the entire circumference of the hat) are also recommended. - Call for new or changing lesions. - Continue Calcipotriene and 5FU to aa's BID x 7 days (spot treat areas)  History of Melanoma In Situ - L mid lat scapula, excised 07/20/20 - No evidence of recurrence today - No lymphadenopathy - Recommend regular full body skin exams - Recommend daily broad spectrum sunscreen SPF 30+ to sun-exposed areas, reapply every 2 hours as needed.  - Call if any new or changing lesions are noted between office visits  Acrochordons (Skin Tags) - Fleshy, skin-colored pedunculated papules - Benign appearing.  - Observe. - If desired, they can be removed with an in office procedure that is not covered by insurance. - Please call the clinic if you notice any new or changing lesions.  Skin cancer screening performed today.  Return in about 4 months (around 02/05/2022) for TBSE.  Luther Redo, CMA, am acting as scribe for [name], MD . Documentation: I have reviewed the above documentation for accuracy and completeness, and I agree with the above.  [name], MD

## 2021-10-05 NOTE — Patient Instructions (Addendum)
Start 5-fluorouracil/calcipotriene cream twice a day for 7 days to affected areas including the face and scalp. Prescription sent to [facility]. Patient provided with contact information for pharmacy and advised the pharmacy will mail the prescription to their home. Patient provided with handout reviewing treatment course and side effects and advised to call or message [place] on MyChart with any concerns.    Due to recent changes in healthcare laws, you may see results of your pathology and/or laboratory studies on MyChart before the doctors have had a chance to review them. We understand that in some cases there may be results that are confusing or concerning to you. Please understand that not all results are received at the same time and often the doctors may need to interpret multiple results in order to provide you with the best plan of care or course of treatment. Therefore, we ask that you please give [place] 2 business days to thoroughly review all your results before contacting the office for clarification. Should we see a critical lab result, you will be contacted sooner.   If You Need Anything After Your Visit  If you have any questions or concerns for your doctor, please call our main line at [phone] and press option 4 to reach your doctor's medical assistant. If no one answers, please leave a voicemail as directed and we will return your call as soon as possible. Messages left after 4 pm will be answered the following business day.   You may also send [place] a message via [place]. We typically respond to MyChart messages within 1-2 business days.  For prescription refills, please ask your pharmacy to contact our office. Our fax number is [phone].  If you have an urgent issue when the clinic is closed that cannot wait until the next business day, you can page your doctor at the number below.    Please note that while we do our best to be available for urgent issues outside of office  hours, we are not available 24/7.   If you have an urgent issue and are unable to reach [place], you may choose to seek medical care at your doctor's office, retail clinic, urgent care center, or emergency room.  If you have a medical emergency, please immediately call 911 or go to the emergency department.  Pager Numbers  - Dr. Nehemiah Massed: [phone]  - Dr. Laurence Ferrari: [phone]  - Dr. Nicole Kindred: [phone]  In the event of inclement weather, please call our main line at [phone] for an update on the status of any delays or closures.  Dermatology Medication Tips: Please keep the boxes that topical medications come in in order to help keep track of the instructions about where and how to use these. Pharmacies typically print the medication instructions only on the boxes and not directly on the medication tubes.   If your medication is too expensive, please contact our office at [phone] option 4 or send [place] a message through [place].   We are unable to tell what your co-pay for medications will be in advance as this is different depending on your insurance coverage. However, we may be able to find a substitute medication at lower cost or fill out paperwork to get insurance to cover a needed medication.   If a prior authorization is required to get your medication covered by your insurance company, please allow [place] 1-2 business days to complete this process.  Drug prices often vary depending on where the prescription is filled and some pharmacies  may offer cheaper prices.  The website www.goodrx.com contains coupons for medications through different pharmacies. The prices here do not account for what the cost may be with help from insurance (it may be cheaper with your insurance), but the website can give you the price if you did not use any insurance.  - You can print the associated coupon and take it with your prescription to the pharmacy.  - You may also stop by our office during regular  business hours and pick up a GoodRx coupon card.  - If you need your prescription sent electronically to a different pharmacy, notify our office through [facility] or by phone at [phone] option 4.     Si Usted Necesita Algo Despus de Su Visita  Tambin puede enviarnos un mensaje a travs de Pharmacist, community. Por lo general respondemos a los mensajes de MyChart en el transcurso de 1 a 2 das hbiles.  Para renovar recetas, por favor pida a su farmacia que se ponga en contacto con nuestra oficina. Harland Dingwall de fax es [place] [phone].  Si tiene un asunto urgente cuando la clnica est cerrada y que no puede esperar hasta el siguiente da hbil, puede llamar/localizar a su doctor(a) al nmero que aparece a continuacin.   Por favor, tenga en cuenta que aunque hacemos todo lo posible para estar disponibles para asuntos urgentes fuera del horario de [place], no estamos disponibles las 24 horas del da, los 7 das de la [place].   Si tiene un problema urgente y no puede comunicarse con nosotros, puede optar por buscar atencin mdica  en el consultorio de su doctor(a), en una clnica privada, en un centro de atencin urgente o en una sala de emergencias.  Si tiene Engineering geologist, por favor llame inmediatamente al 911 o vaya a la sala de emergencias.  Nmeros de bper  - Dr. Nehemiah Massed: [phone]  - Dra. Moye: [phone]  - Dra. Nicole Kindred: [phone]  En caso de inclemencias del [place], por favor llame a Johnsie Kindred principal al [phone] para una actualizacin sobre el [place] de cualquier retraso o cierre.  Consejos para la medicacin en dermatologa: Por favor, guarde las cajas en las que vienen los medicamentos de uso tpico para ayudarle a seguir las instrucciones sobre dnde y cmo usarlos. Las farmacias generalmente imprimen las instrucciones del medicamento slo en las cajas y no directamente en los tubos del Calcium.   Si su medicamento es muy caro, por  favor, pngase en contacto con Zigmund Daniel llamando al [phone] y presione la opcin 4 o envenos un mensaje a travs de Pharmacist, community.   No podemos decirle cul ser su copago por los medicamentos por adelantado ya que esto es diferente dependiendo de la cobertura de su seguro. Sin embargo, es posible que podamos encontrar un medicamento sustituto a Electrical engineer un formulario para que el seguro cubra el medicamento que se considera necesario.   Si se requiere una autorizacin previa para que su compaa de seguros [place] su medicamento, por favor permtanos de 1 a 2 das hbiles para completar este proceso.  Los precios de los medicamentos varan con frecuencia dependiendo del Environmental consultant de dnde se surte la receta y alguna farmacias pueden ofrecer precios ms baratos.  El sitio web www.goodrx.com tiene cupones para medicamentos de Airline pilot. Los precios aqu no tienen en cuenta lo que podra costar con la ayuda del seguro (puede ser ms barato con su seguro), pero el sitio web puede darle el precio si no  utiliz ningn seguro.  - Puede imprimir el cupn correspondiente y llevarlo con su receta a la farmacia.  - Tambin puede pasar por nuestra oficina durante el horario de atencin regular y Charity fundraiser una tarjeta de cupones de GoodRx.  - Si necesita que su receta se enve electrnicamente a una farmacia diferente, informe a nuestra oficina a travs de MyChart de [place] o por telfono llamando al [phone] y presione la opcin 4.

## 2021-10-12 ENCOUNTER — Encounter: Payer: Self-pay | Admitting: Dermatology

## 2021-11-16 ENCOUNTER — Telehealth: Payer: Self-pay | Admitting: Cardiovascular Disease

## 2021-11-16 NOTE — Telephone Encounter (Signed)
Returned call to [employer] and left detailed message to clarify that losartan/hctz combination pill was discontinued by Dr. Rockey Situ per ov note 09/14/21 and was replaced with losartan 25 mg daily. Asked to call back with any further questions.

## 2021-11-16 NOTE — Telephone Encounter (Signed)
Pt c/o medication issue:  1. Name of Medication:   losartan (COZAAR) 25 MG tablet  2. How are you currently taking this medication (dosage and times per day)?   3. Are you having a reaction (difficulty breathing--STAT)?   4. What is your medication issue?    Caller wants to confirm the patient's dosage for this medication was reduced from '100mg'$  to '25mg'$ 

## 2022-01-03 ENCOUNTER — Telehealth: Payer: Self-pay

## 2022-01-03 NOTE — Telephone Encounter (Signed)
Patient called requesting RF for 5FU/Calcipotriene Cream compound. Patient wants to fill rx with [facility]. Called pharmacy and confirmed they can fill RX with 1:1 ratio, 15grams for $49. RX given verbally to pharmacist and patient advised of change. aw

## 2022-02-22 ENCOUNTER — Ambulatory Visit: Payer: Medicare Other | Admitting: Dermatology

## 2022-02-22 VITALS — BP 121/60 | HR 62

## 2022-02-22 DIAGNOSIS — Z86006 Personal history of melanoma in-situ: Secondary | ICD-10-CM

## 2022-02-22 DIAGNOSIS — L820 Inflamed seborrheic keratosis: Secondary | ICD-10-CM

## 2022-02-22 DIAGNOSIS — D225 Melanocytic nevi of trunk: Secondary | ICD-10-CM

## 2022-02-22 DIAGNOSIS — L821 Other seborrheic keratosis: Secondary | ICD-10-CM

## 2022-02-22 DIAGNOSIS — L578 Other skin changes due to chronic exposure to nonionizing radiation: Secondary | ICD-10-CM

## 2022-02-22 DIAGNOSIS — D229 Melanocytic nevi, unspecified: Secondary | ICD-10-CM

## 2022-02-22 DIAGNOSIS — L814 Other melanin hyperpigmentation: Secondary | ICD-10-CM

## 2022-02-22 DIAGNOSIS — Z85820 Personal history of malignant melanoma of skin: Secondary | ICD-10-CM | POA: Diagnosis not present

## 2022-02-22 DIAGNOSIS — L570 Actinic keratosis: Secondary | ICD-10-CM

## 2022-02-22 DIAGNOSIS — Z1283 Encounter for screening for malignant neoplasm of skin: Secondary | ICD-10-CM | POA: Diagnosis not present

## 2022-02-22 NOTE — Patient Instructions (Addendum)

## 2022-02-22 NOTE — Progress Notes (Signed)
Follow-Up Visit   Subjective  Chris Johnson is a 71 y.o. male who presents for the following: Total body skin exam (Hx of Melanoma IS L mid lat scapula 06/2020, hx of AKs) and check spots (L knee, torso, 1-34m no symptoms). The patient presents for Total-Body Skin Exam (TBSE) for skin cancer screening and mole check.  The patient has spots, moles and lesions to be evaluated, some may be new or changing and the patient has concerns that these could be cancer.   The following portions of the chart were reviewed this encounter and updated as appropriate:   Tobacco  Allergies  Meds  Problems  Med Hx  Surg Hx  Fam Hx     Review of Systems:  No other skin or systemic complaints except as noted in HPI or Assessment and Plan.  Objective  Well appearing patient in no apparent distress; mood and affect are within normal limits.  A full examination was performed including scalp, head, eyes, ears, nose, lips, neck, chest, axillae, abdomen, back, buttocks, bilateral upper extremities, bilateral lower extremities, hands, feet, fingers, toes, fingernails, and toenails. All findings within normal limits unless otherwise noted below.  L mid lat scapula Well healed scar with no evidence of recurrence, no lymphadenopathy.   L back x 2, R ant shoulder x 1, R med calf x 1 (4) Stuck on waxy paps with erythema  face, neck x 6 (6) Pink scaly macules   Assessment & Plan   Lentigines - Scattered tan macules - Due to sun exposure - Benign-appearing, observe - Recommend daily broad spectrum sunscreen SPF 30+ to sun-exposed areas, reapply every 2 hours as needed. - Call for any changes  Seborrheic Keratoses - Stuck-on, waxy, tan-brown papules and/or plaques  - Benign-appearing - Discussed benign etiology and prognosis. - Observe - Call for any changes - trunk  Melanocytic Nevi - Tan-brown and/or pink-flesh-colored symmetric macules and papules - Benign appearing on exam today -  Observation - Call clinic for new or changing moles - Recommend daily use of broad spectrum spf 30+ sunscreen to sun-exposed areas.  - trunk  Hemangiomas - Red papules - Discussed benign nature - Observe - Call for any changes - trunk Actinic Damage - Chronic condition, secondary to cumulative UV/sun exposure - diffuse scaly erythematous macules with underlying dyspigmentation - Recommend daily broad spectrum sunscreen SPF 30+ to sun-exposed areas, reapply every 2 hours as needed.  - Staying in the shade or wearing long sleeves, sun glasses (UVA+UVB protection) and wide brim hats (4-inch brim around the entire circumference of the hat) are also recommended for sun protection.  - Call for new or changing lesions.  Skin cancer screening performed today.   History of melanoma in situ L mid lat scapula Excised 06/2020 Clear.  No lymphadenopathy.  Observe for recurrence. Call clinic for new or changing lesions.  Recommend regular skin exams, daily broad-spectrum spf 30+ sunscreen use, and photoprotection.    Inflamed seborrheic keratosis (4) L back x 2, R ant shoulder x 1, R med calf x 1 Symptomatic, irritating, patient would like treated. Destruction of lesion - L back x 2, R ant shoulder x 1, R med calf x 1 Complexity: simple   Destruction method: cryotherapy   Informed consent: discussed and consent obtained   Timeout:  patient name, date of birth, surgical site, and procedure verified Lesion destroyed using liquid nitrogen: Yes   Region frozen until ice ball extended beyond lesion: Yes   Outcome: patient tolerated procedure  well with no complications   Post-procedure details: wound care instructions given    AK (actinic keratosis) (6) face, neck x 6 Destruction of lesion - face, neck x 6 Complexity: simple   Destruction method: cryotherapy   Informed consent: discussed and consent obtained   Timeout:  patient name, date of birth, surgical site, and procedure  verified Lesion destroyed using liquid nitrogen: Yes   Region frozen until ice ball extended beyond lesion: Yes   Outcome: patient tolerated procedure well with no complications   Post-procedure details: wound care instructions given    Related Medications fluorouracil (EFUDEX) 5 % cream Apply to aa's BID x 7 days  Return in about 4 months (around 06/23/2022) for TBSE, Hx of Melanoma IS, Hx of AKs.  I, Chris Johnson, RMA, am acting as scribe for Chris Ser, MD . Documentation: I have reviewed the above documentation for accuracy and completeness, and I agree with the above.  Chris Ser, MD

## 2022-03-05 ENCOUNTER — Encounter: Payer: Self-pay | Admitting: Dermatology

## 2022-03-22 ENCOUNTER — Other Ambulatory Visit: Payer: Self-pay | Admitting: Cardiovascular Disease

## 2022-03-22 MED ORDER — LOSARTAN POTASSIUM 50 MG PO TABS: 50.0000 mg | ORAL_TABLET | Freq: Every day | ORAL | 3 refills | Status: DC

## 2022-06-22 ENCOUNTER — Ambulatory Visit: Payer: Medicare Other | Admitting: Dermatology

## 2022-09-13 ENCOUNTER — Telehealth: Payer: Self-pay | Admitting: Cardiovascular Disease

## 2022-09-13 ENCOUNTER — Telehealth: Payer: Self-pay | Admitting: Emergency Medicine

## 2022-09-13 DIAGNOSIS — I6529 Occlusion and stenosis of unspecified carotid artery: Secondary | ICD-10-CM

## 2022-09-13 DIAGNOSIS — I739 Peripheral vascular disease, unspecified: Secondary | ICD-10-CM

## 2022-09-13 NOTE — Telephone Encounter (Signed)
Left voice mail to schedule appt for CAROTID.

## 2022-09-13 NOTE — Telephone Encounter (Signed)
Left voice mail to schedule appt

## 2022-09-13 NOTE — Telephone Encounter (Signed)
-----   Message from Antonieta Iba, MD sent at 09/13/2022  1:27 PM EDT ----- Would you be able to help me order and facilitate scheduling of a carotid ultrasound on this gentleman History of PAD, carotid stenosis Thx TGollan

## 2022-09-13 NOTE — Telephone Encounter (Signed)
Ordered placed for Carotid US. Will forward to scheduling.

## 2022-09-14 NOTE — Telephone Encounter (Signed)
Pt is scheduled for Friday 09/15/22 @1pm 

## 2022-09-15 ENCOUNTER — Ambulatory Visit: Payer: Medicare Other | Attending: Cardiovascular Disease

## 2022-09-15 ENCOUNTER — Other Ambulatory Visit: Payer: Self-pay | Admitting: Cardiovascular Disease

## 2022-09-15 DIAGNOSIS — I6529 Occlusion and stenosis of unspecified carotid artery: Secondary | ICD-10-CM

## 2022-09-15 DIAGNOSIS — I739 Peripheral vascular disease, unspecified: Secondary | ICD-10-CM

## 2022-09-15 DIAGNOSIS — I6523 Occlusion and stenosis of bilateral carotid arteries: Secondary | ICD-10-CM

## 2022-09-18 ENCOUNTER — Other Ambulatory Visit: Payer: Self-pay | Admitting: Cardiovascular Disease

## 2022-09-18 NOTE — Telephone Encounter (Signed)
Please schedule 12 month F/U appointment for 90 day refills. Thank you! 

## 2022-09-19 ENCOUNTER — Telehealth: Payer: Self-pay | Admitting: Cardiovascular Disease

## 2022-09-19 NOTE — Telephone Encounter (Signed)
Left voice mail to schedule appt

## 2022-09-19 NOTE — Telephone Encounter (Signed)
Pt is scheduled on 8/30

## 2022-10-03 ENCOUNTER — Ambulatory Visit: Payer: Medicare Other | Attending: Cardiovascular Disease | Admitting: Cardiovascular Disease

## 2022-10-03 ENCOUNTER — Encounter: Payer: Self-pay | Admitting: *Deleted

## 2022-10-03 ENCOUNTER — Encounter: Payer: Self-pay | Admitting: Cardiovascular Disease

## 2022-10-03 VITALS — BP 138/80 | HR 55 | Ht 71.5 in | Wt 180.0 lb

## 2022-10-03 DIAGNOSIS — I10 Essential (primary) hypertension: Secondary | ICD-10-CM

## 2022-10-03 DIAGNOSIS — I2510 Atherosclerotic heart disease of native coronary artery without angina pectoris: Secondary | ICD-10-CM

## 2022-10-03 MED ORDER — EZETIMIBE 10 MG PO TABS: 10.0000 mg | ORAL_TABLET | Freq: Every day | ORAL | 3 refills | Status: DC

## 2022-10-03 MED ORDER — LOSARTAN POTASSIUM 50 MG PO TABS: 50.0000 mg | ORAL_TABLET | Freq: Every day | ORAL | 3 refills | Status: DC

## 2022-10-03 NOTE — Addendum Note (Signed)
Addended by: Antonieta Iba on: 10/03/2022 03:08 PM   Modules accepted: Orders

## 2022-10-03 NOTE — Progress Notes (Signed)
Date:  10/03/2022   ID:  Chris Johnson, DOB 1950-05-21, MRN [id number]  Patient Location:  [address] [place] [address]   Provider location:   [facility], [place] office  PCP:  Shane Crutch, PA  Cardiologist:  Hubbard Robinson [facility]  Chief Complaint  Patient presents with   12 month follow up     Discuss carotid u/s results. Patient c/o a pounding in ears. Medications reviewed by the patient verbally.      History of Present Illness:    Chris Johnson is a 72 y.o. male  past medical history of Gout Hypertension Hyperlipidemia  mild bilateral carotid disease by ultrasound September 2019 Chronic leg pain Chronic renal dysfunction Calcium score of 176 in 2018 Carotid Doppler 11/2017 with bilateral 1-39% stenosis Repeat carotid 2024 with no significant change lower extremity duplex 04/2018 with triphasic waveforms  Who presents for routine follow-up of hypertension, cardiac risk factors  Last seen by myself in clinic June 2023  Reported having pulsation in his ears over the past month, concerned about carotid disease given family history Carotid ultrasound ordered showing minimal nonobstructive disease  Was using his drill recently, injury to right forearm, possible rupture of forearm tendon Reports having prior rupture of biceps tendon, took him 1 year to recover  Blood pressure has been well-controlled at home Asymptomatic bradycardia  On losartan 50 daily Blood pressure low in the past on losartan HCTZ Not on amlodipine  History of leg pain, feet neuropathy Parkinsons  " Legs blew up" on Lyrica, cause severe pain  Off crestor, too many pills and given his leg pain leg pain is chronic on tramadol Tolerating Zetia  Prior carotid ultrasound 2019 with minimal plaque Calcium scoring 176  EKG personally reviewed by myself on todays visit EKG Interpretation Date/Time:  Tuesday October 03 2022 14:42:47 EDT Ventricular Rate:  55 PR  Interval:  170 QRS Duration:  104 QT Interval:  422 QTC Calculation: 403 R Axis:   -11  Text Interpretation: Sinus bradycardia with sinus arrhythmia  No previous ECGs available  Confirmed by Julien Nordmann [phone]) on 10/03/2022 2:50:27 PM    Imaging reviewed showing mild carotid disease, coronary calcium score 170    Past Medical History:  Diagnosis Date   Actinic keratosis 06/16/2020   L post deltoid, LN2 07/27/2020   Arthritis    Gout 10/25/2017   Hyperlipidemia    Hypertension    Melanoma in situ (HCC) 06/22/2020   L mid lat scapula, excised 07/20/20   Normal cardiac stress test 1995   cardiolyte   Past Surgical History:  Procedure Laterality Date   colonoscopy     VARICOSE VEIN SURGERY      Current Meds  Medication Sig   allopurinol (ZYLOPRIM) 100 MG tablet Take 200 mg by mouth daily.   baclofen (LIORESAL) 10 MG tablet Take 2 tablets (20 mg total) by mouth 2 (two) times daily.   carbidopa-levodopa (SINEMET IR) 25-100 MG tablet Take 1 tablet by mouth 3 (three) times daily. Two tablets in the am, one tablet at noon and 2 tablets in the pm,   colchicine 0.6 MG tablet Take 0.6 mg by mouth 2 (two) times daily as needed.   Cyanocobalamin (VITAMIN B 12) 500 MCG TABS Take 500 mg by mouth in the morning and at bedtime.   Efinaconazole (JUBLIA) 10 % SOLN Apply to the toenails QHS   ezetimibe (ZETIA) 10 MG tablet Take 1 tablet (10 mg total) by  mouth daily.   ketoconazole (NIZORAL) 2 % cream Apply to the feet QHS.   losartan (COZAAR) 50 MG tablet Take 1 tablet (50 mg total) by mouth daily.   pregabalin (LYRICA) 50 MG capsule Take 1-2 capsules (50-100 mg total) by mouth 3 (three) times daily. Continue to titrate dose up as instructed.   traMADol (ULTRAM) 50 MG tablet Take 50-100 mg by mouth 4 (four) times daily as needed.   zolpidem (AMBIEN CR) 12.5 MG CR tablet Take 12.5 mg by mouth at bedtime as needed for sleep.     Allergies:   Atorvastatin, Celexa [citalopram  hydrobromide], Citalopram, Other, and Statins   Social History   Tobacco Use   Smoking status: Never   Smokeless tobacco: Never  Vaping Use   Vaping Use: Never used  Substance Use Topics   Alcohol use: Yes    Alcohol/week: 5.0 standard drinks of alcohol    Types: 5 Standard drinks or equivalent per week    Comment: Occasional   Drug use: No     Family Hx: The patient's family history includes Alcohol abuse in his mother; Arthritis in his father; Breast cancer in his sister; Heart attack in his mother; Hypertension in his mother; Prostate cancer in his father; Skin cancer in his mother; Stroke in his mother.  ROS:   Please see the history of present illness.    Review of Systems  Constitutional: Negative.   Respiratory: Negative.    Cardiovascular: Negative.   Gastrointestinal: Negative.   Musculoskeletal: Negative.   Neurological: Negative.   Psychiatric/Behavioral: Negative.    All other systems reviewed and are negative.    Labs/Other Tests and Data Reviewed:    Recent Labs: No results found for requested labs within last 365 days.   Recent Lipid Panel Lab Results  Component Value Date/Time   CHOL 188 09/14/2021 11:37 AM   TRIG 277 (H) 09/14/2021 11:37 AM   HDL 38 (L) 09/14/2021 11:37 AM   CHOLHDL 4.9 09/14/2021 11:37 AM   LDLCALC 95 09/14/2021 11:37 AM   LDLDIRECT 144.0 12/14/2015 02:59 PM    Wt Readings from Last 3 Encounters:  10/03/22 180 lb (81.6 kg)  09/14/21 187 lb 4 oz (84.9 kg)  05/13/20 190 lb (86.2 kg)     Exam:    Vital Signs: Vital signs may also be detailed in the HPI BP 138/80 (BP Location: Left Arm, Patient Position: Sitting, Cuff Size: Normal)   Pulse (!) 55   Ht 5' 11.5" (1.816 m)   Wt 180 lb (81.6 kg)   SpO2 97%   BMI 24.76 kg/m   Constitutional:  oriented to person, place, and time. No distress.  HENT:  Head: Grossly normal Eyes:  no discharge. No scleral icterus.  Neck: No JVD, no carotid bruits  Cardiovascular: Regular rate  and rhythm, no murmurs appreciated Pulmonary/Chest: Clear to auscultation bilaterally, no wheezes or rails Abdominal: Soft.  no distension.  no tenderness.  Musculoskeletal: Normal range of motion Neurological:  normal muscle tone. Coordination normal. No atrophy Skin: Skin warm and dry Psychiatric: normal affect, pleasant   ASSESSMENT & PLAN:    Essential hypertension -  Blood pressure stable on losartan 50 daily  Renal insufficiency Renal function stable on BMP March 2024  Chronic pain syndrome - Leg pain, followed by the pain clinic Has neuropathy, diagnosed with Parkinson's  Mixed hyperlipidemia -  Continue Zetia 10 mg daily We will avoid a statin given his chronic leg pain  Right forearm pain Possible ruptured  tendon from using drill, swelling noted Recommended icing, could consider evaluation by orthopedic urgent care   Total encounter time more than 30 minutes  Greater than 50% was spent in counseling and coordination of care with the patient   Signed, Julien Nordmann, MD  10/03/2022 2:48 PM    [facility] [address], [place], [place] [address]

## 2022-10-03 NOTE — Patient Instructions (Signed)
Medication Instructions:  No changes  If you need a refill on your cardiac medications before your next appointment, please call your pharmacy.   Lab work: No new labs needed  Testing/Procedures: No new testing needed  Follow-Up: At [facility], you and your health needs are our priority.  As part of our continuing mission to provide you with exceptional heart care, we have created designated Provider Care Teams.  These Care Teams include your primary Cardiologist (physician) and Advanced Practice Providers (APPs -  Physician Assistants and Nurse Practitioners) who all work together to provide you with the care you need, when you need it.  You will need a follow up appointment in 12 months  Providers on your designated Care Team:   Christopher Berge, NP Ryan Dunn, PA-C Cadence Furth, PA-C  COVID-19 Vaccine Information can be found at: [email or web address] For questions related to vaccine distribution or appointments, please email [email or web address] or call [phone].   

## 2022-10-18 ENCOUNTER — Ambulatory Visit: Payer: Medicare Other | Admitting: Dermatology

## 2022-10-18 VITALS — BP 157/94 | HR 68

## 2022-10-18 DIAGNOSIS — L814 Other melanin hyperpigmentation: Secondary | ICD-10-CM | POA: Diagnosis not present

## 2022-10-18 DIAGNOSIS — D229 Melanocytic nevi, unspecified: Secondary | ICD-10-CM

## 2022-10-18 DIAGNOSIS — L821 Other seborrheic keratosis: Secondary | ICD-10-CM

## 2022-10-18 DIAGNOSIS — Z1283 Encounter for screening for malignant neoplasm of skin: Secondary | ICD-10-CM | POA: Diagnosis not present

## 2022-10-18 DIAGNOSIS — D1801 Hemangioma of skin and subcutaneous tissue: Secondary | ICD-10-CM

## 2022-10-18 DIAGNOSIS — L578 Other skin changes due to chronic exposure to nonionizing radiation: Secondary | ICD-10-CM | POA: Diagnosis not present

## 2022-10-18 DIAGNOSIS — L820 Inflamed seborrheic keratosis: Secondary | ICD-10-CM

## 2022-10-18 DIAGNOSIS — L918 Other hypertrophic disorders of the skin: Secondary | ICD-10-CM

## 2022-10-18 DIAGNOSIS — W908XXA Exposure to other nonionizing radiation, initial encounter: Secondary | ICD-10-CM | POA: Diagnosis not present

## 2022-10-18 DIAGNOSIS — Z86006 Personal history of melanoma in-situ: Secondary | ICD-10-CM

## 2022-10-18 DIAGNOSIS — Z85820 Personal history of malignant melanoma of skin: Secondary | ICD-10-CM

## 2022-10-18 NOTE — Progress Notes (Signed)
Follow-Up Visit   Subjective  Chris Johnson is a 72 y.o. male who presents for the following: Skin Cancer Screening and Full Body Skin Exam  The patient presents for Total-Body Skin Exam (TBSE) for skin cancer screening and mole check. The patient has spots, moles and lesions to be evaluated, some may be new or changing and the patient may have concern these could be cancer.    The following portions of the chart were reviewed this encounter and updated as appropriate: medications, allergies, medical history  Review of Systems:  No other skin or systemic complaints except as noted in HPI or Assessment and Plan.  Objective  Well appearing patient in no apparent distress; mood and affect are within normal limits.  A full examination was performed including scalp, head, eyes, ears, nose, lips, neck, chest, axillae, abdomen, back, buttocks, bilateral upper extremities, bilateral lower extremities, hands, feet, fingers, toes, fingernails, and toenails. All findings within normal limits unless otherwise noted below.   Relevant physical exam findings are noted in the Assessment and Plan.  R med calf x 1, R axilla x 14, L axilla x 4 (19) Erythematous stuck-on, waxy papule or plaque  Right Axilla x 3, L axilla x 3 (6) Fleshy, skin-colored pedunculated papules.      Assessment & Plan   SKIN CANCER SCREENING PERFORMED TODAY.  ACTINIC DAMAGE - Chronic condition, secondary to cumulative UV/sun exposure - diffuse scaly erythematous macules with underlying dyspigmentation - Recommend daily broad spectrum sunscreen SPF 30+ to sun-exposed areas, reapply every 2 hours as needed.  - Staying in the shade or wearing long sleeves, sun glasses (UVA+UVB protection) and wide brim hats (4-inch brim around the entire circumference of the hat) are also recommended for sun protection.  - Call for new or changing lesions.  LENTIGINES, SEBORRHEIC KERATOSES, HEMANGIOMAS - Benign normal skin lesions -  Benign-appearing - Call for any changes  MELANOCYTIC NEVI - Tan-brown and/or pink-flesh-colored symmetric macules and papules - Benign appearing on exam today - Observation - Call clinic for new or changing moles - Recommend daily use of broad spectrum spf 30+ sunscreen to sun-exposed areas.   HISTORY OF MELANOMA IN SITU - No evidence of recurrence today - Recommend regular full body skin exams - Recommend daily broad spectrum sunscreen SPF 30+ to sun-exposed areas, reapply every 2 hours as needed.  - Call if any new or changing lesions are noted between office visits  Inflamed seborrheic keratosis (19) R med calf x 1, R axilla x 14, L axilla x 4  Destruction of lesion - R med calf x 1, R axilla x 14, L axilla x 4 (19) Complexity: simple   Destruction method: cryotherapy   Informed consent: discussed and consent obtained   Timeout:  patient name, date of birth, surgical site, and procedure verified Lesion destroyed using liquid nitrogen: Yes   Region frozen until ice ball extended beyond lesion: Yes   Outcome: patient tolerated procedure well with no complications   Post-procedure details: wound care instructions given    Skin tag (6) Right Axilla x 3, L axilla x 3  Symptomatic, irritating, patient would like treated.   Destruction of lesion - Right Axilla x 3, L axilla x 3 (6) Complexity: simple   Destruction method: cryotherapy   Informed consent: discussed and consent obtained   Timeout:  patient name, date of birth, surgical site, and procedure verified Lesion destroyed using liquid nitrogen: Yes   Region frozen until ice ball extended beyond lesion: Yes  Outcome: patient tolerated procedure well with no complications   Post-procedure details: wound care instructions given      Return in about 6 months (around 04/20/2023) for TBSE.  Maylene Roes, CMA, am acting as scribe for Armida Sans, MD .   Documentation: I have reviewed the above documentation for  accuracy and completeness, and I agree with the above.  Armida Sans, MD

## 2022-10-18 NOTE — Patient Instructions (Signed)

## 2022-10-22 ENCOUNTER — Encounter: Payer: Self-pay | Admitting: Dermatology

## 2022-10-24 ENCOUNTER — Telehealth: Payer: Self-pay | Admitting: Cardiovascular Disease

## 2022-10-24 NOTE — Telephone Encounter (Signed)
Called patient, advised that he started taking two 50 mg tablets about a week ago.  He states his blood pressure was running around 160's on the 50 mg tablet, so he started taking 100 mg tablets daily and he states he feels much better on this dosage. His blood pressure is now running 135/85. He is almost out of the 50 mg tablets, and is wanting to know if Dr.Gollan would be okay to increase him to 100 mg tablets. He used Sports administrator, verified with patient if given okay to increase.    Patient aware will send message to MD for approval.  Patient verbalized understanding.

## 2022-10-24 NOTE — Telephone Encounter (Signed)
Pt c/o medication issue:  1. Name of Medication: losartan (COZAAR) 50 MG tablet   2. How are you currently taking this medication (dosage and times per day)? Two tablets daily  3. Are you having a reaction (difficulty breathing--STAT)? No   4. What is your medication issue? Patient is requesting 100 mg tablets be prescribed so he can take one tablet daily instead of two. Patient is running low and would like a 90 day supply sent into [facility]. Please advise.

## 2022-10-25 MED ORDER — LOSARTAN POTASSIUM 100 MG PO TABS: 100.0000 mg | ORAL_TABLET | Freq: Every day | ORAL | 3 refills | Status: DC

## 2022-10-25 NOTE — Telephone Encounter (Signed)
Called and spoke with patient. Informed him of the following from Dr. Mariah Milling.  We can send in losartan 100 mg daily Thx TGollan   Patient verbalizes understanding. Prescription sent to preferred pharmacy.

## 2022-10-31 ENCOUNTER — Telehealth: Payer: Self-pay | Admitting: Cardiovascular Disease

## 2022-10-31 NOTE — Telephone Encounter (Signed)
Follow Up::        Chris Johnson says she just need to know if the patient is taking a Statin and if he is allergic to Statins. Please leave thi sanswer on her voicemail, if she does not answer.

## 2022-10-31 NOTE — Telephone Encounter (Signed)
[employer] nurse Bonnielee Haff is requesting a callback regarding a open care opportunity fax that was sent over to the office around 8/1. She'd like to discuss further once called. Please advise

## 2022-10-31 NOTE — Telephone Encounter (Signed)
Left a message for the patient to call back.  

## 2022-10-31 NOTE — Telephone Encounter (Signed)
Contacted [employer] and left a message stating pt is not currently taking a statin and it's listed on pt allergy list due to leg pain.

## 2022-11-24 ENCOUNTER — Ambulatory Visit: Payer: Medicare Other | Admitting: Cardiovascular Disease

## 2023-03-07 ENCOUNTER — Ambulatory Visit: Payer: Medicare Other | Admitting: Dermatology

## 2023-03-07 DIAGNOSIS — Z1283 Encounter for screening for malignant neoplasm of skin: Secondary | ICD-10-CM

## 2023-03-07 DIAGNOSIS — D229 Melanocytic nevi, unspecified: Secondary | ICD-10-CM

## 2023-03-07 DIAGNOSIS — L814 Other melanin hyperpigmentation: Secondary | ICD-10-CM | POA: Diagnosis not present

## 2023-03-07 DIAGNOSIS — L820 Inflamed seborrheic keratosis: Secondary | ICD-10-CM

## 2023-03-07 DIAGNOSIS — Z85820 Personal history of malignant melanoma of skin: Secondary | ICD-10-CM

## 2023-03-07 DIAGNOSIS — L578 Other skin changes due to chronic exposure to nonionizing radiation: Secondary | ICD-10-CM | POA: Diagnosis not present

## 2023-03-07 DIAGNOSIS — W908XXA Exposure to other nonionizing radiation, initial encounter: Secondary | ICD-10-CM | POA: Diagnosis not present

## 2023-03-07 DIAGNOSIS — L821 Other seborrheic keratosis: Secondary | ICD-10-CM

## 2023-03-07 DIAGNOSIS — D1801 Hemangioma of skin and subcutaneous tissue: Secondary | ICD-10-CM

## 2023-03-07 DIAGNOSIS — Z872 Personal history of diseases of the skin and subcutaneous tissue: Secondary | ICD-10-CM

## 2023-03-07 DIAGNOSIS — L570 Actinic keratosis: Secondary | ICD-10-CM | POA: Diagnosis not present

## 2023-03-07 DIAGNOSIS — Z86006 Personal history of melanoma in-situ: Secondary | ICD-10-CM

## 2023-03-07 NOTE — Progress Notes (Signed)
Follow-Up Visit   Subjective  Alieu Veltkamp is a 72 y.o. male who presents for the following: Skin Cancer Screening and Upper Body Skin Exam, check spot arms, face, R leg, no symptoms, hx of Melanoma IS L mid lat scapula, hx of AKs  The patient presents for Upper Body Skin Exam (UBSE) for skin cancer screening and mole check. The patient has spots, moles and lesions to be evaluated, some may be new or changing and the patient may have concern these could be cancer.    The following portions of the chart were reviewed this encounter and updated as appropriate: medications, allergies, medical history  Review of Systems:  No other skin or systemic complaints except as noted in HPI or Assessment and Plan.  Objective  Well appearing patient in no apparent distress; mood and affect are within normal limits.  All skin waist up examined. Relevant physical exam findings are noted in the Assessment and Plan.  L bicep x 1, R bicep x 1, R post shoulder x 5, R ant thigh x 1, R forearm x 1, L dorsum hand x 11 (20) Stuck on waxy paps with erythema R temple x 1, L temple x 1, scalp x 6 (8) Pink scaly macules   Assessment & Plan   INFLAMED SEBORRHEIC KERATOSIS (20) L bicep x 1, R bicep x 1, R post shoulder x 5, R ant thigh x 1, R forearm x 1, L dorsum hand x 11 (20) Symptomatic, irritating, patient would like treated. Destruction of lesion - L bicep x 1, R bicep x 1, R post shoulder x 5, R ant thigh x 1, R forearm x 1, L dorsum hand x 11 (20) Complexity: simple   Destruction method: cryotherapy   Informed consent: discussed and consent obtained   Timeout:  patient name, date of birth, surgical site, and procedure verified Lesion destroyed using liquid nitrogen: Yes   Region frozen until ice ball extended beyond lesion: Yes   Outcome: patient tolerated procedure well with no complications   Post-procedure details: wound care instructions given   AK (ACTINIC KERATOSIS) (8) R temple x 1, L  temple x 1, scalp x 6 (8) Actinic keratoses are precancerous spots that appear secondary to cumulative UV radiation exposure/sun exposure over time. They are chronic with expected duration over 1 year. A portion of actinic keratoses will progress to squamous cell carcinoma of the skin. It is not possible to reliably predict which spots will progress to skin cancer and so treatment is recommended to prevent development of skin cancer.  Recommend daily broad spectrum sunscreen SPF 30+ to sun-exposed areas, reapply every 2 hours as needed.  Recommend staying in the shade or wearing long sleeves, sun glasses (UVA+UVB protection) and wide brim hats (4-inch brim around the entire circumference of the hat). Call for new or changing lesions. Destruction of lesion - R temple x 1, L temple x 1, scalp x 6 (8) Complexity: simple   Destruction method: cryotherapy   Informed consent: discussed and consent obtained   Timeout:  patient name, date of birth, surgical site, and procedure verified Lesion destroyed using liquid nitrogen: Yes   Region frozen until ice ball extended beyond lesion: Yes   Outcome: patient tolerated procedure well with no complications   Post-procedure details: wound care instructions given    Skin cancer screening performed today.  Actinic Damage - Chronic condition, secondary to cumulative UV/sun exposure - diffuse scaly erythematous macules with underlying dyspigmentation - Recommend daily broad spectrum  sunscreen SPF 30+ to sun-exposed areas, reapply every 2 hours as needed.  - Staying in the shade or wearing long sleeves, sun glasses (UVA+UVB protection) and wide brim hats (4-inch brim around the entire circumference of the hat) are also recommended for sun protection.  - Call for new or changing lesions.  Lentigines, Seborrheic Keratoses, Hemangiomas - Benign normal skin lesions - Benign-appearing - Call for any changes  Melanocytic Nevi - Tan-brown and/or  pink-flesh-colored symmetric macules and papules - Benign appearing on exam today - Observation - Call clinic for new or changing moles - Recommend daily use of broad spectrum spf 30+ sunscreen to sun-exposed areas.   HISTORY OF MELANOMA IN SITU - No evidence of recurrence today - Recommend regular full body skin exams - Recommend daily broad spectrum sunscreen SPF 30+ to sun-exposed areas, reapply every 2 hours as needed.  - Call if any new or changing lesions are noted between office visits  - L mid lat scapula excised 07/20/20  Return for as scheduled for TBSE , Hx of Melanoma IS, Hx of AKs.  I, Ardis Rowan, RMA, am acting as scribe for Armida Sans, MD .   Documentation: I have reviewed the above documentation for accuracy and completeness, and I agree with the above.  Armida Sans, MD

## 2023-03-07 NOTE — Patient Instructions (Addendum)

## 2023-03-11 ENCOUNTER — Encounter: Payer: Self-pay | Admitting: Dermatology

## 2023-05-18 ENCOUNTER — Other Ambulatory Visit: Payer: Self-pay | Admitting: Cardiovascular Disease

## 2023-05-18 ENCOUNTER — Telehealth: Payer: Self-pay | Admitting: Cardiovascular Disease

## 2023-05-18 MED ORDER — AMLODIPINE BESYLATE 10 MG PO TABS: 10.0000 mg | ORAL_TABLET | Freq: Every day | ORAL | 3 refills | Status: DC | PRN

## 2023-05-18 MED ORDER — DOXAZOSIN MESYLATE 1 MG PO TABS: 1.0000 mg | ORAL_TABLET | Freq: Two times a day (BID) | ORAL | 6 refills | Status: DC

## 2023-05-18 NOTE — Telephone Encounter (Signed)
Received phone call that blood pressure running high 169/92 consistently. Taking losartan 100 mg daily. Given prior history of leg swelling (on lyrica?), will avoid amlodipine Given prior history of gout will avoid hydrochlorothiazide Recommend he start Cardura 1 mg twice daily, continue losartan 100 daily Further titration upwards of the Cardura as needed for blood pressure control  Signed, Dossie Arbour, MD, Ph.D [facility]

## 2023-05-22 ENCOUNTER — Telehealth: Payer: Self-pay | Admitting: Cardiovascular Disease

## 2023-05-22 DIAGNOSIS — Z79899 Other long term (current) drug therapy: Secondary | ICD-10-CM

## 2023-05-22 MED ORDER — DOXAZOSIN MESYLATE 2 MG PO TABS: 2.0000 mg | ORAL_TABLET | Freq: Two times a day (BID) | ORAL | Status: DC

## 2023-05-22 NOTE — Telephone Encounter (Signed)
 Called and spoke with patient. Patient states that his blood pressure was elevated on Friday. Patient states that he was started on Cardura 1 MG twice daily and Amiodarone PRN. Patient states that with adding Cardura his blood pressure has still been running 160/90. Patient states that he has not taken Amiodarone. Patient is requesting labs. He would like to check his kidney function, liver function and uric acid. Will forward to provider for recommendations.

## 2023-05-22 NOTE — Addendum Note (Signed)
 Addended by: Jani Gravel on: 05/22/2023 10:07 AM   Modules accepted: Orders

## 2023-05-22 NOTE — Telephone Encounter (Signed)
 Pt is calling wanting labs ordered

## 2023-05-22 NOTE — Telephone Encounter (Signed)
 Called patient and notified him that Dr. Mariah Milling recommends increasing the Cardura to 2 MG twice daily. Patient also informed that per Dr. Mariah Milling patient to have a CMP and uric acid drawn. Patient verbalizes understanding.

## 2023-05-23 ENCOUNTER — Telehealth: Payer: Self-pay | Admitting: Emergency Medicine

## 2023-05-23 LAB — COMPREHENSIVE METABOLIC PANEL
ALT: 6 [IU]/L (ref 0–44)
AST: 12 [IU]/L (ref 0–40)
Albumin: 4.3 g/dL (ref 3.9–4.9)
Alkaline Phosphatase: 81 [IU]/L (ref 44–121)
BUN/Creatinine Ratio: 13 (ref 10–24)
BUN: 16 mg/dL (ref 8–27)
Bilirubin Total: 0.4 mg/dL (ref 0.0–1.2)
CO2: 25 mmol/L (ref 20–29)
Calcium: 9.4 mg/dL (ref 8.6–10.2)
Chloride: 103 mmol/L (ref 96–106)
Creatinine, Ser: 1.2 mg/dL (ref 0.76–1.27)
Globulin, Total: 2.2 g/dL (ref 1.5–4.5)
Glucose: 79 mg/dL (ref 70–99)
Potassium: 4.1 mmol/L (ref 3.5–5.2)
Sodium: 143 mmol/L (ref 134–144)
Total Protein: 6.5 g/dL (ref 6.0–8.5)
eGFR: 65 mL/min/{1.73_m2} (ref >59)

## 2023-05-23 LAB — URIC ACID: Uric Acid: 5.9 mg/dL (ref 3.8–8.4)

## 2023-05-23 NOTE — Telephone Encounter (Signed)
 error

## 2023-05-23 NOTE — Telephone Encounter (Signed)
 Received message from scheduling.  Appointment Request From: [name]   With Provider: Julien Nordmann [facility] [place]]   Preferred Date Range: 05/23/2023 - 05/25/2023   Preferred Times: Any Time   Reason for visit: Office Visit   Health Maintenance Topic:    Comments: Very hi blood pressure, review going back on HCTZ     Called and spoke with patient. Patient states that he started taking Cardura 2 MG twice daily yesterday. Patient states that his blood pressure was 160/95 this morning. Patient states that this blood pressure was 30 minutes after taking medication. Patient checked blood pressure while on the phone which was 2 hours after taking medication and current pressure is 158/86. Patient encouraged to continue taking Cardura 2 MG twice daily and the prescribed Amlodipine as needed. Patient encouraged to check his blood pressure 2 hours after taking medication. Patient states that he does not want to schedule an appointment at this time. That he will wait for recommendations from Dr. Mariah Milling.

## 2023-05-26 ENCOUNTER — Encounter: Payer: Self-pay | Admitting: Cardiovascular Disease

## 2023-06-17 IMAGING — MR MR LUMBAR SPINE W/O CM
5 series · 31 of 48 positions shown · non-contrast
Comparison: None.

CLINICAL DATA: Low back pain and stiffness

EXAM:
MRI LUMBAR SPINE WITHOUT CONTRAST
TECHNIQUE: Multiplanar, multisequence MR imaging of the lumbar spine was
performed. No intravenous contrast was administered.

[Series 5: T2 · sagittal · 4.0mm · 0.81mm/px · 7 of 17 slices shown (1 of 2)]
[im 1/17]
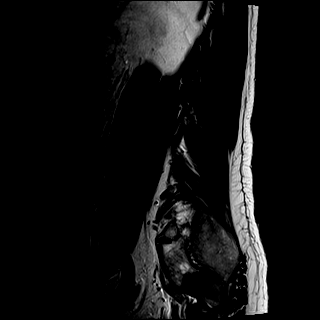
[im 3/17]
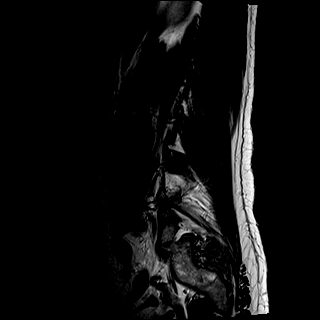
[im 6/17]
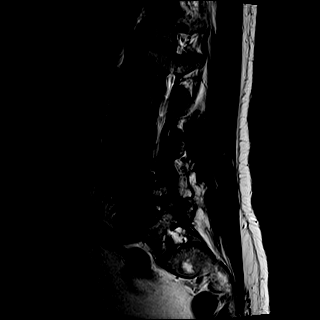
[im 9/17]
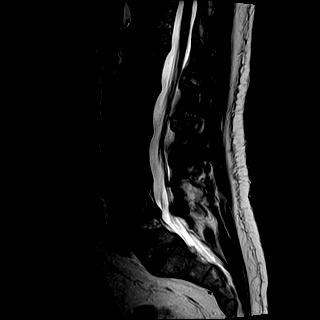
[im 11/17]
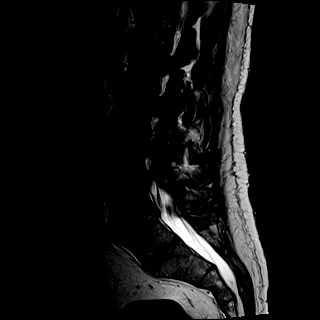
[im 14/17]
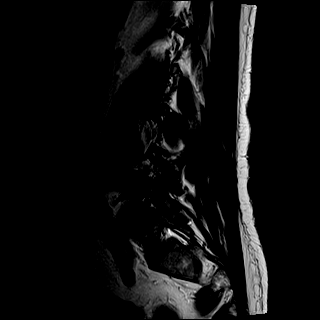
[im 17/17]
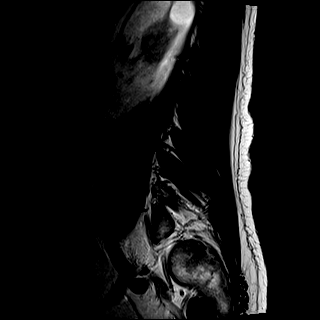

[Series 6: T1 · sagittal · 4.0mm · 0.81mm/px · 7 of 17 slices shown (1 of 2)]
[im 1/17]
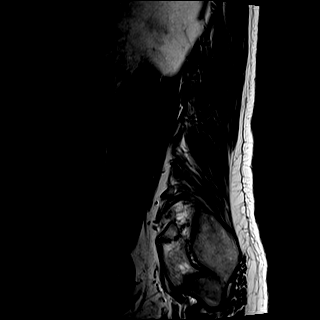
[im 3/17]
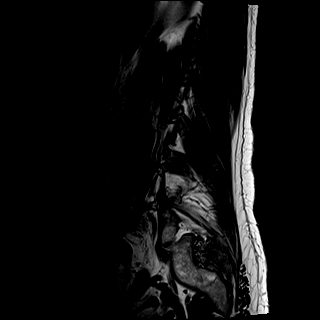
[im 6/17]
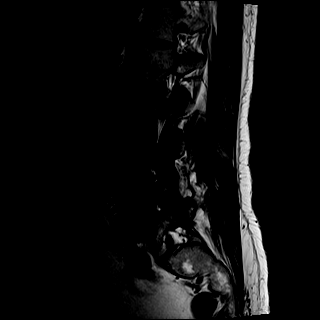
[im 9/17]
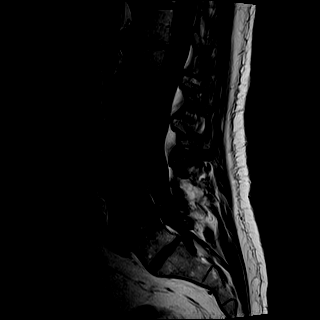
[im 11/17]
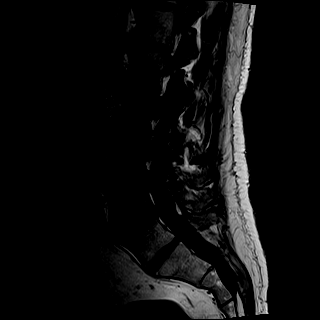
[im 14/17]
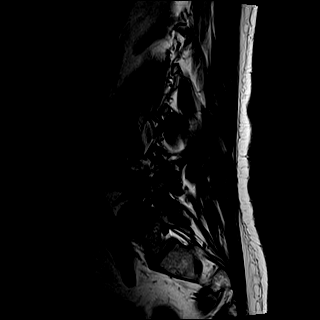
[im 17/17]
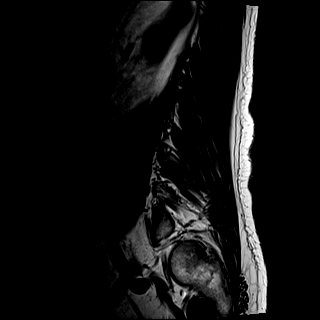

[Series 7: STIR · sagittal · 4.0mm · 0.41mm/px · 1 of 17 slices shown]
[im 1/17]
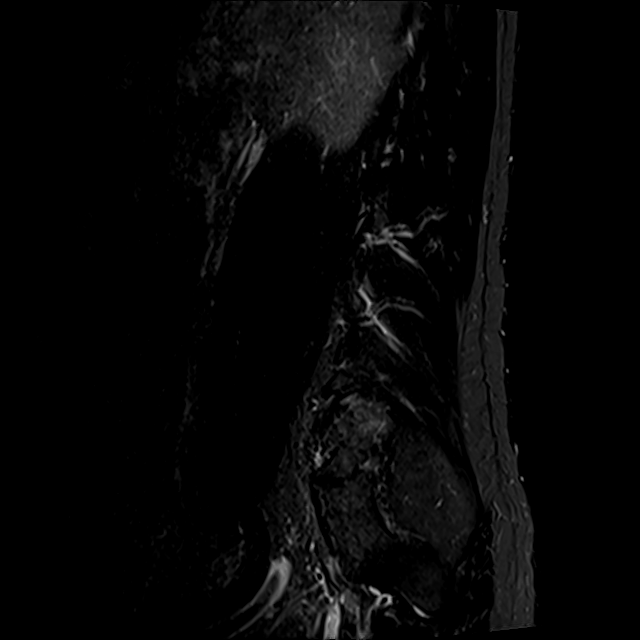

[Series 8: T2 · axial · 4.0mm · 0.78mm/px · z∈[-88,+126]mm · 8 of 38 slices shown (2 of 2)]
[im 1/38]
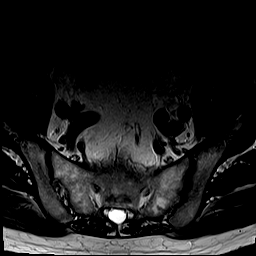
[im 6/38]
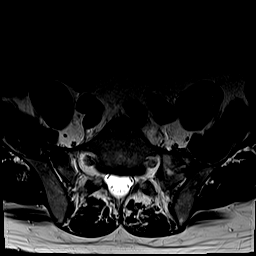
[im 12/38]
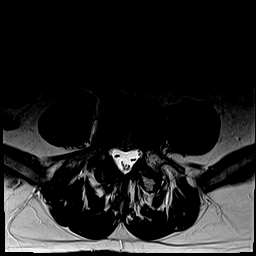
[im 18/38]
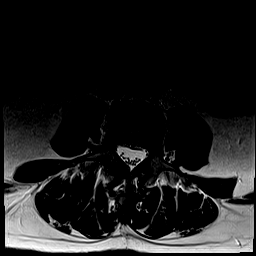
[im 20/38]
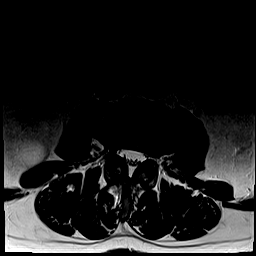
[im 26/38]
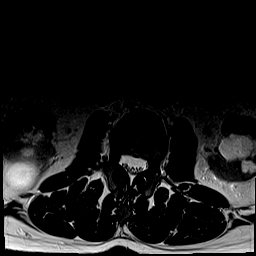
[im 32/38]
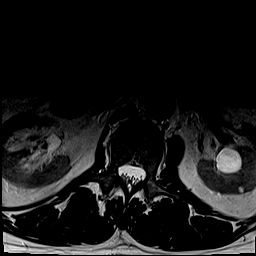
[im 38/38]
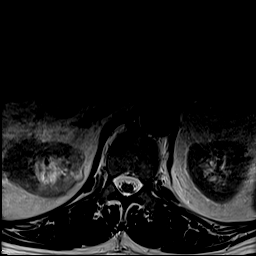

[Series 9: T1 · axial · 4.0mm · 0.39mm/px · z∈[-88,+126]mm · 8 of 38 slices shown (2 of 2)]
[im 1/38]
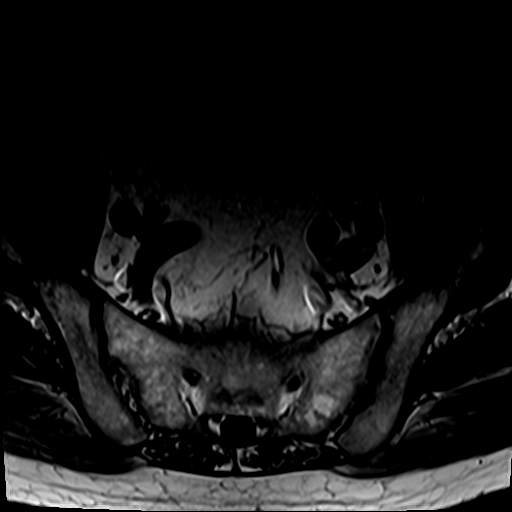
[im 6/38]
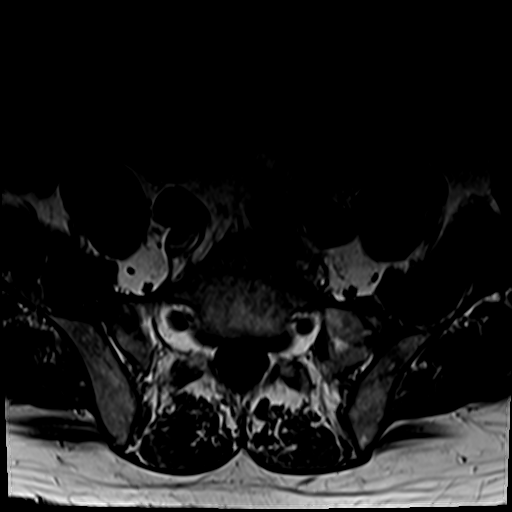
[im 12/38]
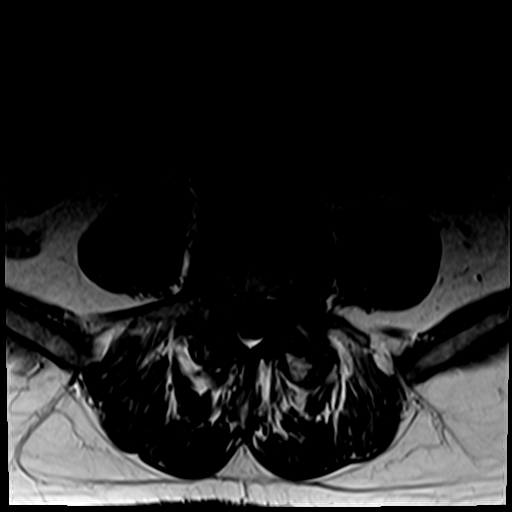
[im 18/38]
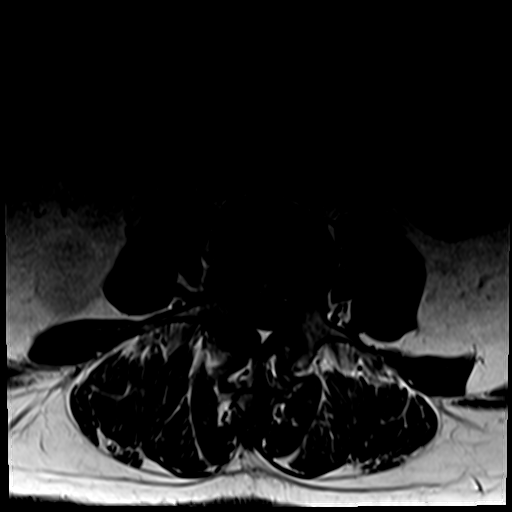
[im 20/38]
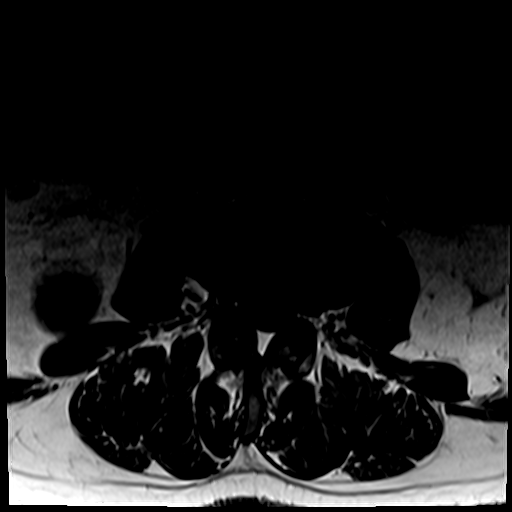
[im 26/38]
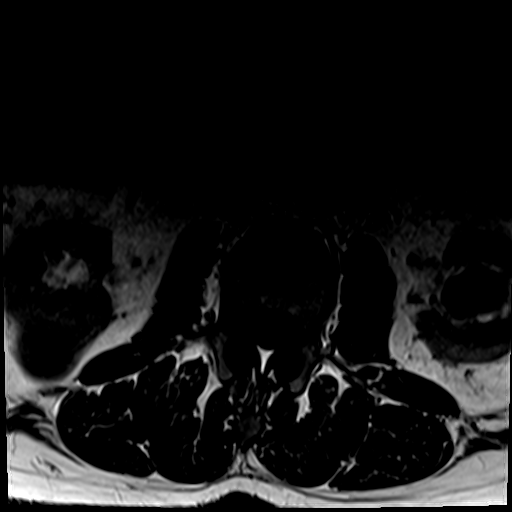
[im 32/38]
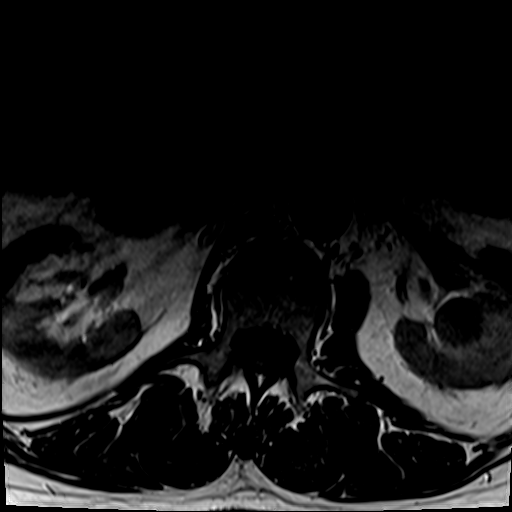
[im 38/38]
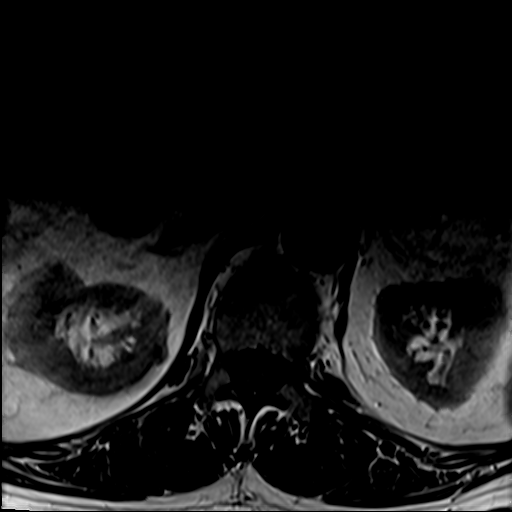

[31 of 48 positions shown; findings below may reference images not displayed]

FINDINGS: Segmentation:  Standard.

Alignment:  Physiologic.

Vertebrae: Endplate edema at L4-5 with left facet edema. No findings
of discitis-osteomyelitis or an aggressive osseous lesion. No
fracture.

Conus medullaris and cauda equina: Conus extends to the L1 level.
Conus and cauda equina appear normal.

Paraspinal and other soft tissues: Bilateral renal cysts, largest of
which measures 3.2 cm.

Disc levels:

L1-L2: Normal disc space and facet joints. No spinal canal stenosis.
No neural foraminal stenosis.

L2-L3: Moderate facet hypertrophy, right greater than left. No disc
herniation. No spinal canal stenosis. No neural foraminal stenosis.

L3-L4: Disc space narrowing and moderate facet hypertrophy. No
spinal canal stenosis. Mild right and no left neural foraminal
stenosis.

L4-L5: Moderate facet hypertrophy with left asymmetric disc bulge.
No spinal canal stenosis. Mild right and moderate left neural
foraminal stenosis.

L5-S1: Normal disc space and facet joints. No spinal canal stenosis.
No neural foraminal stenosis.

Visualized sacrum: Normal.
IMPRESSION: 1. L4-L5 moderate left neural foraminal stenosis due to combination
of left asymmetric disc bulge and facet arthrosis. Endplate edema
and left facet edema at this level may be a source of local low back
pain.
2. Mild right L3-4 neural foraminal stenosis.
3. No spinal canal stenosis.

## 2023-06-20 ENCOUNTER — Ambulatory Visit: Payer: Medicare Other | Admitting: Dermatology

## 2023-06-20 ENCOUNTER — Encounter: Payer: Self-pay | Admitting: Dermatology

## 2023-06-20 DIAGNOSIS — W908XXA Exposure to other nonionizing radiation, initial encounter: Secondary | ICD-10-CM | POA: Diagnosis not present

## 2023-06-20 DIAGNOSIS — D1801 Hemangioma of skin and subcutaneous tissue: Secondary | ICD-10-CM

## 2023-06-20 DIAGNOSIS — L814 Other melanin hyperpigmentation: Secondary | ICD-10-CM

## 2023-06-20 DIAGNOSIS — Z86006 Personal history of melanoma in-situ: Secondary | ICD-10-CM

## 2023-06-20 DIAGNOSIS — L820 Inflamed seborrheic keratosis: Secondary | ICD-10-CM

## 2023-06-20 DIAGNOSIS — L821 Other seborrheic keratosis: Secondary | ICD-10-CM | POA: Diagnosis not present

## 2023-06-20 DIAGNOSIS — Z79899 Other long term (current) drug therapy: Secondary | ICD-10-CM

## 2023-06-20 DIAGNOSIS — Z5111 Encounter for antineoplastic chemotherapy: Secondary | ICD-10-CM

## 2023-06-20 DIAGNOSIS — L570 Actinic keratosis: Secondary | ICD-10-CM

## 2023-06-20 DIAGNOSIS — D229 Melanocytic nevi, unspecified: Secondary | ICD-10-CM

## 2023-06-20 DIAGNOSIS — D692 Other nonthrombocytopenic purpura: Secondary | ICD-10-CM

## 2023-06-20 DIAGNOSIS — Z85820 Personal history of malignant melanoma of skin: Secondary | ICD-10-CM

## 2023-06-20 DIAGNOSIS — Z1283 Encounter for screening for malignant neoplasm of skin: Secondary | ICD-10-CM

## 2023-06-20 DIAGNOSIS — L578 Other skin changes due to chronic exposure to nonionizing radiation: Secondary | ICD-10-CM

## 2023-06-20 DIAGNOSIS — Z7189 Other specified counseling: Secondary | ICD-10-CM

## 2023-06-20 DIAGNOSIS — Z872 Personal history of diseases of the skin and subcutaneous tissue: Secondary | ICD-10-CM

## 2023-06-20 MED ORDER — FLUOROURACIL 5 % EX CREA: TOPICAL_CREAM | Freq: Two times a day (BID) | CUTANEOUS | 3 refills | Status: AC

## 2023-06-20 NOTE — Patient Instructions (Addendum)
 Instructions for [facility] Medications  One or more of your medications was sent to the [facility] mail order compounding pharmacy. You will receive an email from them and can purchase the medicine through that link. It will then be mailed to your home at the address you confirmed. If for any reason you do not receive an email from them, please check your spam folder. If you still do not find the email, please let us know. [facility] phone number is [phone].   In 1 month - Start 5-fluorouracil/calcipotriene cream twice a day for 7 days to affected areas including bil temples. Prescription sent to [facility]. Patient advised they will receive an email to purchase the medication online and have it sent to their home. Patient provided with handout reviewing treatment course and side effects and advised to call or message [place] on MyChart with any concerns.  5-Fluorouracil/Calcipotriene Patient Education   Actinic keratoses are the dry, red scaly spots on the skin caused by sun damage. A portion of these spots can turn into skin cancer with time, and treating them can help prevent development of skin cancer.   Treatment of these spots requires removal of the defective skin cells. There are various ways to remove actinic keratoses, including freezing with liquid nitrogen, treatment with creams, or treatment with a blue light procedure in the office.   5-fluorouracil cream is a topical cream used to treat actinic keratoses. It works by interfering with the growth of abnormal fast-growing skin cells, such as actinic keratoses. These cells peel off and are replaced by healthy ones.   5-fluorouracil/calcipotriene is a combination of the 5-fluorouracil cream with a vitamin D analog cream called calcipotriene. The calcipotriene alone does not treat actinic keratoses. However, when it is combined with 5-fluorouracil, it helps the 5-fluorouracil treat the actinic  keratoses much faster so that the same results can be achieved with a much shorter treatment time.  INSTRUCTIONS FOR 5-FLUOROURACIL/CALCIPOTRIENE CREAM:   5-fluorouracil/calcipotriene cream typically only needs to be used for 4-7 days. A thin layer should be applied twice a day to the treatment areas recommended by your physician.   If your physician prescribed you separate tubes of 5-fluourouracil and calcipotriene, apply a thin layer of 5-fluorouracil followed by a thin layer of calcipotriene.   Avoid contact with your eyes, nostrils, and mouth. Do not use 5-fluorouracil/calcipotriene cream on infected or open wounds.   You will develop redness, irritation and some crusting at areas where you have pre-cancer damage/actinic keratoses. IF YOU DEVELOP PAIN, BLEEDING, OR SIGNIFICANT CRUSTING, STOP THE TREATMENT EARLY - you have already gotten a good response and the actinic keratoses should clear up well.  Wash your hands after applying 5-fluorouracil 5% cream on your skin.   A moisturizer or sunscreen with a minimum SPF 30 should be applied each morning.   Once you have finished the treatment, you can apply a thin layer of Vaseline twice a day to irritated areas to soothe and calm the areas more quickly. If you experience significant discomfort, contact your physician.  For some patients it is necessary to repeat the treatment for best results.  SIDE EFFECTS: When using 5-fluorouracil/calcipotriene cream, you may have mild irritation, such as redness, dryness, swelling, or a mild burning sensation. This usually resolves within 2 weeks. The more actinic keratoses you have, the more redness and inflammation you can expect during treatment. Eye irritation has been reported rarely. If this occurs, please let us know.  If you  have any trouble using this cream, please call the office. If you have any other questions about this information, please do not hesitate to ask me before you leave the  office.   Cryotherapy Aftercare  Wash gently with soap and water everyday.   Apply Vaseline and Band-Aid daily until healed.     Due to recent changes in healthcare laws, you may see results of your pathology and/or laboratory studies on MyChart before the doctors have had a chance to review them. We understand that in some cases there may be results that are confusing or concerning to you. Please understand that not all results are received at the same time and often the doctors may need to interpret multiple results in order to provide you with the best plan of care or course of treatment. Therefore, we ask that you please give [place] 2 business days to thoroughly review all your results before contacting the office for clarification. Should we see a critical lab result, you will be contacted sooner.   If You Need Anything After Your Visit  If you have any questions or concerns for your doctor, please call our main line at [phone] and press option 4 to reach your doctor's medical assistant. If no one answers, please leave a voicemail as directed and we will return your call as soon as possible. Messages left after 4 pm will be answered the following business day.   You may also send [place] a message via MyChart. We typically respond to MyChart messages within 1-2 business days.  For prescription refills, please ask your pharmacy to contact our office. Our fax number is [phone].  If you have an urgent issue when the clinic is closed that cannot wait until the next business day, you can page your doctor at the number below.    Please note that while we do our best to be available for urgent issues outside of office hours, we are not available 24/7.   If you have an urgent issue and are unable to reach [place], you may choose to seek medical care at your doctor's office, retail clinic, urgent care center, or emergency room.  If you have a medical emergency, please immediately call 911 or go to  the emergency department.  Pager Numbers  - Dr. Gwen Pounds: [phone]  - Dr. Roseanne Reno: [phone]  - Dr. Katrinka Blazing: [phone]   In the event of inclement weather, please call our main line at [phone] for an update on the status of any delays or closures.  Dermatology Medication Tips: Please keep the boxes that topical medications come in in order to help keep track of the instructions about where and how to use these. Pharmacies typically print the medication instructions only on the boxes and not directly on the medication tubes.   If your medication is too expensive, please contact our office at [phone] option 4 or send [place] a message through MyChart.   We are unable to tell what your co-pay for medications will be in advance as this is different depending on your insurance coverage. However, we may be able to find a substitute medication at lower cost or fill out paperwork to get insurance to cover a needed medication.   If a prior authorization is required to get your medication covered by your insurance company, please allow [place] 1-2 business days to complete this process.  Drug prices often vary depending on where the prescription is filled and some pharmacies may offer cheaper prices.  The website  www.goodrx.com contains coupons for medications through different pharmacies. The prices here do not account for what the cost may be with help from insurance (it may be cheaper with your insurance), but the website can give you the price if you did not use any insurance.  - You can print the associated coupon and take it with your prescription to the pharmacy.  - You may also stop by our office during regular business hours and pick up a GoodRx coupon card.  - If you need your prescription sent electronically to a different pharmacy, notify our office through [facility] or by phone at [phone] option 4.     Si Usted Necesita Algo Despus de Su Visita  Tambin  puede enviarnos un mensaje a travs de Clinical cytogeneticist. Por lo general respondemos a los mensajes de MyChart en el transcurso de 1 a 2 das hbiles.  Para renovar recetas, por favor pida a su farmacia que se ponga en contacto con nuestra oficina. Annie Sable de fax es [place] [phone].  Si tiene un asunto urgente cuando la clnica est cerrada y que no puede esperar hasta el siguiente da hbil, puede llamar/localizar a su doctor(a) al nmero que aparece a continuacin.   Por favor, tenga en cuenta que aunque hacemos todo lo posible para estar disponibles para asuntos urgentes fuera del horario de [place], no estamos disponibles las 24 horas del da, los 7 [address]  [address] de [place].   Si tiene un problema urgente y no puede comunicarse con nosotros, puede optar por buscar atencin mdica  en el consultorio de su doctor(a), en una clnica privada, en un centro de atencin urgente o en una sala de emergencias.  Si tiene Engineer, drilling, por favor llame inmediatamente al 911 o vaya a la sala de emergencias.  Nmeros de bper  - Dr. Gwen Pounds: [phone]  - Dra. Roseanne Reno: [phone]  - Dr. Katrinka Blazing: [phone]   En caso de inclemencias del tiempo, por favor llame a Lacy Duverney principal al [phone] para una actualizacin sobre el [place] de cualquier retraso o cierre.  Consejos para la medicacin en dermatologa: Por favor, guarde las cajas en las que vienen los medicamentos de uso tpico para ayudarle a seguir las instrucciones sobre dnde y cmo usarlos. Las farmacias generalmente imprimen las instrucciones del medicamento slo en las cajas y no directamente en los tubos del [place].   Si su medicamento es muy caro, por favor, pngase en contacto con Rolm Gala llamando al [phone] y presione la opcin 4 o envenos un mensaje a travs de Clinical cytogeneticist.   No podemos decirle cul ser su copago por los medicamentos por adelantado ya que esto es diferente dependiendo de la cobertura de su  seguro. Sin embargo, es posible que podamos encontrar un medicamento sustituto a Audiological scientist un formulario para que el seguro cubra el medicamento que se considera necesario.   Si se requiere una autorizacin previa para que su compaa de seguros [place] su medicamento, por favor permtanos de 1 a 2 das hbiles para completar [address]  Los precios de los medicamentos varan con frecuencia dependiendo del Environmental consultant de dnde se surte la receta y alguna farmacias pueden ofrecer precios ms baratos.  El sitio web www.goodrx.com tiene cupones para medicamentos de Health and safety inspector. Los precios aqu no tienen en cuenta lo que podra costar con la ayuda del seguro (puede ser ms barato con su seguro), pero el sitio web puede darle el precio si no utiliz Tourist information centre manager.  - Puede  imprimir el cupn correspondiente y llevarlo con su receta a la farmacia.  - Tambin puede pasar por nuestra oficina durante el horario de atencin regular y Education officer, museum una tarjeta de cupones de GoodRx.  - Si necesita que su receta se enve electrnicamente a una farmacia diferente, informe a nuestra oficina a travs de MyChart de [place] o por telfono llamando al [phone] y presione la opcin 4.

## 2023-06-20 NOTE — Progress Notes (Signed)
 Follow-Up Visit   Subjective  Chris Johnson is a 73 y.o. male who presents for the following: Skin Cancer Screening and Upper Body Skin Exam hx of Melanoma IS, hx of Aks, check spot L forearm, temples, back  The patient presents for Upper Body Skin Exam (UBSE) for skin cancer screening and mole check. The patient has spots, moles and lesions to be evaluated, some may be new or changing and the patient may have concern these could be cancer.  The following portions of the chart were reviewed this encounter and updated as appropriate: medications, allergies, medical history  Review of Systems:  No other skin or systemic complaints except as noted in HPI or Assessment and Plan.  Objective  Well appearing patient in no apparent distress; mood and affect are within normal limits.  All skin waist up examined. Relevant physical exam findings are noted in the Assessment and Plan.  Left Forearm x 2 (2) Stuck on waxy paps with erythema temples, forehead x 7 (7) Pink scaly macules  Assessment & Plan   INFLAMED SEBORRHEIC KERATOSIS (2) Left Forearm x 2 (2) Symptomatic, irritating, patient would like treated. Destruction of lesion - Left Forearm x 2 (2) Complexity: simple   Destruction method: cryotherapy   Informed consent: discussed and consent obtained   Timeout:  patient name, date of birth, surgical site, and procedure verified Lesion destroyed using liquid nitrogen: Yes   Region frozen until ice ball extended beyond lesion: Yes   Outcome: patient tolerated procedure well with no complications   Post-procedure details: wound care instructions given   AK (ACTINIC KERATOSIS) (7) temples, forehead x 7 (7) Actinic keratoses are precancerous spots that appear secondary to cumulative UV radiation exposure/sun exposure over time. They are chronic with expected duration over 1 year. A portion of actinic keratoses will progress to squamous cell carcinoma of the skin. It is not possible to  reliably predict which spots will progress to skin cancer and so treatment is recommended to prevent development of skin cancer.  Recommend daily broad spectrum sunscreen SPF 30+ to sun-exposed areas, reapply every 2 hours as needed.  Recommend staying in the shade or wearing long sleeves, sun glasses (UVA+UVB protection) and wide brim hats (4-inch brim around the entire circumference of the hat). Call for new or changing lesions. Destruction of lesion - temples, forehead x 7 (7) Complexity: simple   Destruction method: cryotherapy   Informed consent: discussed and consent obtained   Timeout:  patient name, date of birth, surgical site, and procedure verified Lesion destroyed using liquid nitrogen: Yes   Region frozen until ice ball extended beyond lesion: Yes   Outcome: patient tolerated procedure well with no complications   Post-procedure details: wound care instructions given    ACTINIC DAMAGE WITH PRECANCEROUS ACTINIC KERATOSES Counseling for Topical Chemotherapy Management: Patient exhibits: - Severe, confluent actinic changes with pre-cancerous actinic keratoses that is secondary to cumulative UV radiation exposure over time - Condition that is severe; chronic, not at goal. - diffuse scaly erythematous macules and papules with underlying dyspigmentation - Discussed Prescription "Field Treatment" topical Chemotherapy for Severe, Chronic Confluent Actinic Changes with Pre-Cancerous Actinic Keratoses Field treatment involves treatment of an entire area of skin that has confluent Actinic Changes (Sun/ Ultraviolet light damage) and PreCancerous Actinic Keratoses by method of PhotoDynamic Therapy (PDT) and/or prescription Topical Chemotherapy agents such as 5-fluorouracil, 5-fluorouracil/calcipotriene, and/or imiquimod.  The purpose is to decrease the number of clinically evident and subclinical PreCancerous lesions to prevent progression to development  of skin cancer by chemically destroying  early precancer changes that may or may not be visible.  It has been shown to reduce the risk of developing skin cancer in the treated area. As a result of treatment, redness, scaling, crusting, and open sores may occur during treatment course. One or more than one of these methods may be used and may have to be used several times to control, suppress and eliminate the PreCancerous changes. Discussed treatment course, expected reaction, and possible side effects. - Recommend daily broad spectrum sunscreen SPF 30+ to sun-exposed areas, reapply every 2 hours as needed.  - Staying in the shade or wearing long sleeves, sun glasses (UVA+UVB protection) and wide brim hats (4-inch brim around the entire circumference of the hat) are also recommended. - Call for new or changing lesions.  In 1 month - Start 5-fluorouracil/calcipotriene cream twice a day for 7 days to affected areas including bil temples. Prescription sent to [facility]. Patient advised they will receive an email to purchase the medication online and have it sent to their home. Patient provided with handout reviewing treatment course and side effects and advised to call or message [place] on MyChart with any concerns.  Reviewed course of treatment and expected reaction.  Patient advised to expect inflammation and crusting and advised that erosions are possible.  Patient advised to be diligent with sun protection during and after treatment. Counseled to keep medication out of reach of children and pets.   Skin cancer screening performed today.  Lentigines, Seborrheic Keratoses, Hemangiomas - Benign normal skin lesions - Benign-appearing - Call for any changes  Melanocytic Nevi - Tan-brown and/or pink-flesh-colored symmetric macules and papules - Benign appearing on exam today - Observation - Call clinic for new or changing moles - Recommend daily use of broad spectrum spf 30+ sunscreen to sun-exposed areas.   HISTORY  OF MELANOMA IN SITU - No evidence of recurrence today - Recommend regular full body skin exams - Recommend daily broad spectrum sunscreen SPF 30+ to sun-exposed areas, reapply every 2 hours as needed.  - Call if any new or changing lesions are noted between office visits  - L mid lat scapula excised 07/20/20  Purpura - Chronic; persistent and recurrent.  Treatable, but not curable. - Violaceous macules and patches - Benign - Related to trauma, age, sun damage and/or use of blood thinners, chronic use of topical and/or oral steroids - Observe - Can use OTC arnica containing moisturizer such as Dermend Bruise Formula if desired - Call for worsening or other concerns  Return in about 6 months (around 12/21/2023) for TBSE, Hx of Melanoma IS, Hx of AKs f/u 5FU/Calcipotriene cr bil temples.  I, Ardis Rowan, RMA, am acting as scribe for Armida Sans, MD .   Documentation: I have reviewed the above documentation for accuracy and completeness, and I agree with the above.  Armida Sans, MD

## 2023-10-16 ENCOUNTER — Other Ambulatory Visit: Payer: Self-pay | Admitting: Cardiovascular Disease

## 2023-10-16 MED ORDER — DOXAZOSIN MESYLATE 2 MG PO TABS: 2.0000 mg | ORAL_TABLET | Freq: Two times a day (BID) | ORAL | 0 refills | Status: DC

## 2023-10-30 ENCOUNTER — Other Ambulatory Visit: Payer: Self-pay | Admitting: Cardiovascular Disease

## 2023-11-14 ENCOUNTER — Other Ambulatory Visit: Payer: Self-pay | Admitting: Cardiovascular Disease

## 2023-11-16 ENCOUNTER — Telehealth: Payer: Self-pay | Admitting: Cardiovascular Disease

## 2023-11-16 ENCOUNTER — Other Ambulatory Visit: Payer: Self-pay | Admitting: Cardiovascular Disease

## 2023-11-16 MED ORDER — AMLODIPINE BESYLATE 10 MG PO TABS: 10.0000 mg | ORAL_TABLET | Freq: Every day | ORAL | 3 refills | Status: AC

## 2023-11-16 MED ORDER — LOSARTAN POTASSIUM 100 MG PO TABS: 100.0000 mg | ORAL_TABLET | Freq: Every day | ORAL | 3 refills | Status: AC

## 2023-11-16 MED ORDER — DOXAZOSIN MESYLATE 4 MG PO TABS: 4.0000 mg | ORAL_TABLET | Freq: Two times a day (BID) | ORAL | 3 refills | Status: AC

## 2023-11-16 MED ORDER — EZETIMIBE 10 MG PO TABS: 10.0000 mg | ORAL_TABLET | Freq: Every day | ORAL | 3 refills | Status: AC

## 2023-11-16 NOTE — Telephone Encounter (Signed)
 Received phone call that blood pressure was elevated, 170/100 He had been taking amlodipine  as needed, Cardura  2 twice daily with losartan  Recommend he take amlodipine  10 every morning, increase Cardura  up to 4 twice daily, continue losartan  Follow-up blood pressure this morning 128/78 Refills and changes to medication sent into [place] [facility]  Signed, Velinda Lunger, MD, Ph.D [facility]

## 2023-12-25 ENCOUNTER — Encounter: Payer: Self-pay | Admitting: Dermatology

## 2023-12-25 ENCOUNTER — Ambulatory Visit: Admitting: Dermatology

## 2023-12-25 DIAGNOSIS — Z5111 Encounter for antineoplastic chemotherapy: Secondary | ICD-10-CM

## 2023-12-25 DIAGNOSIS — L570 Actinic keratosis: Secondary | ICD-10-CM

## 2023-12-25 DIAGNOSIS — Z85820 Personal history of malignant melanoma of skin: Secondary | ICD-10-CM

## 2023-12-25 DIAGNOSIS — Z7189 Other specified counseling: Secondary | ICD-10-CM

## 2023-12-25 DIAGNOSIS — Z1283 Encounter for screening for malignant neoplasm of skin: Secondary | ICD-10-CM | POA: Diagnosis not present

## 2023-12-25 DIAGNOSIS — L820 Inflamed seborrheic keratosis: Secondary | ICD-10-CM

## 2023-12-25 DIAGNOSIS — L814 Other melanin hyperpigmentation: Secondary | ICD-10-CM | POA: Diagnosis not present

## 2023-12-25 DIAGNOSIS — L821 Other seborrheic keratosis: Secondary | ICD-10-CM | POA: Diagnosis not present

## 2023-12-25 DIAGNOSIS — D229 Melanocytic nevi, unspecified: Secondary | ICD-10-CM

## 2023-12-25 DIAGNOSIS — W908XXA Exposure to other nonionizing radiation, initial encounter: Secondary | ICD-10-CM

## 2023-12-25 DIAGNOSIS — Z86006 Personal history of melanoma in-situ: Secondary | ICD-10-CM

## 2023-12-25 DIAGNOSIS — L578 Other skin changes due to chronic exposure to nonionizing radiation: Secondary | ICD-10-CM

## 2023-12-25 DIAGNOSIS — Z79899 Other long term (current) drug therapy: Secondary | ICD-10-CM

## 2023-12-25 NOTE — Patient Instructions (Addendum)
 - Starting 01/26/2024 - Start 5-fluorouracil /calcipotriene cream twice a day for 7 days to affected areas including bilateral temples. Patient has prescription from [facility] at home.    Instructions for [facility] Medications  One or more of your medications was sent to the [facility] mail order compounding pharmacy. You will receive an email from them and can purchase the medicine through that link. It will then be mailed to your home at the address you confirmed. If for any reason you do not receive an email from them, please check your spam folder. If you still do not find the email, please let us  know. [facility] phone number is [phone].   Cryotherapy Aftercare  Wash gently with soap and water everyday.   Apply Vaseline and Band-Aid daily until healed.     Due to recent changes in healthcare laws, you may see results of your pathology and/or laboratory studies on MyChart before the doctors have had a chance to review them. We understand that in some cases there may be results that are confusing or concerning to you. Please understand that not all results are received at the same time and often the doctors may need to interpret multiple results in order to provide you with the best plan of care or course of treatment. Therefore, we ask that you please give us  2 business days to thoroughly review all your results before contacting the office for clarification. Should we see a critical lab result, you will be contacted sooner.   If You Need Anything After Your Visit  If you have any questions or concerns for your doctor, please call our main line at [phone] and press option 4 to reach your doctor's medical assistant. If no one answers, please leave a voicemail as directed and we will return your call as soon as possible. Messages left after 4 pm will be answered the following business day.   You may also send us  a message via MyChart. We typically respond to  MyChart messages within 1-2 business days.  For prescription refills, please ask your pharmacy to contact our office. Our fax number is [phone].  If you have an urgent issue when the clinic is closed that cannot wait until the next business day, you can page your doctor at the number below.    Please note that while we do our best to be available for urgent issues outside of office hours, we are not available 24/7.   If you have an urgent issue and are unable to reach us , you may choose to seek medical care at your doctor's office, retail clinic, urgent care center, or emergency room.  If you have a medical emergency, please immediately call 911 or go to the emergency department.  Pager Numbers  - Dr. Hester: [phone]  - Dr. Jackquline: [phone]  - Dr. Claudene: [phone]   - Dr. Raymund: [phone]  In the event of inclement weather, please call our main line at [phone] for an update on the status of any delays or closures.  Dermatology Medication Tips: Please keep the boxes that topical medications come in in order to help keep track of the instructions about where and how to use these. Pharmacies typically print the medication instructions only on the boxes and not directly on the medication tubes.   If your medication is too expensive, please contact our office at [phone] option 4 or send us  a message through MyChart.   We are unable to tell what your co-pay for  medications will be in advance as this is different depending on your insurance coverage. However, we may be able to find a substitute medication at lower cost or fill out paperwork to get insurance to cover a needed medication.   If a prior authorization is required to get your medication covered by your insurance company, please allow us  1-2 business days to complete this process.  Drug prices often vary depending on where the prescription is filled and some pharmacies may offer cheaper  prices.  The website www.goodrx.com contains coupons for medications through different pharmacies. The prices here do not account for what the cost may be with help from insurance (it may be cheaper with your insurance), but the website can give you the price if you did not use any insurance.  - You can print the associated coupon and take it with your prescription to the pharmacy.  - You may also stop by our office during regular business hours and pick up a GoodRx coupon card.  - If you need your prescription sent electronically to a different pharmacy, notify our office through [facility] or by phone at [phone] option 4.     Si Usted Necesita Algo Despus de Su Visita  Tambin puede enviarnos un mensaje a travs de Clinical cytogeneticist. Por lo general respondemos a los mensajes de MyChart en el transcurso de 1 a 2 das hbiles.  Para renovar recetas, por favor pida a su farmacia que se ponga en contacto con nuestra oficina. Randi lakes de fax es [place] [phone].  Si tiene un asunto urgente cuando la clnica est cerrada y que no puede esperar hasta el siguiente da hbil, puede llamar/localizar a su doctor(a) al nmero que aparece a continuacin.   Por favor, tenga en cuenta que aunque hacemos todo lo posible para estar disponibles para asuntos urgentes fuera del horario de [place], no estamos disponibles las 24 horas del da, los 7 [address]  [address] de la [place].   Si tiene un problema urgente y no puede comunicarse con nosotros, puede optar por buscar atencin mdica  en el consultorio de su doctor(a), en una clnica privada, en un centro de atencin urgente o en una sala de emergencias.  Si tiene Engineer, drilling, por favor llame inmediatamente al 911 o vaya a la sala de emergencias.  Nmeros de bper  - Dr. Hester: [phone]  - Dra. Jackquline: [phone]  - Dr. Claudene: [phone]  - Dra. Kitts: [phone]  En caso de inclemencias del [place], por favor llame a nuestra  lnea principal al [phone] para una actualizacin sobre el estado de cualquier retraso o cierre.  Consejos para la medicacin en dermatologa: Por favor, guarde las cajas en las que vienen los medicamentos de uso tpico para ayudarle a seguir las instrucciones sobre dnde y cmo usarlos. Las farmacias generalmente imprimen las instrucciones del medicamento slo en las cajas y no directamente en los tubos del [place].   Si su medicamento es muy caro, por favor, pngase en contacto con landry rieger llamando al [phone] y presione la opcin 4 o envenos un mensaje a travs de Clinical cytogeneticist.   No podemos decirle cul ser su copago por los medicamentos por adelantado ya que esto es diferente dependiendo de la cobertura de su seguro. Sin embargo, es posible que podamos encontrar un medicamento sustituto a Audiological scientist un formulario para que el seguro cubra el medicamento que se considera necesario.   Si se requiere una autorizacin previa para que su compaa de seguros cubra  su medicamento, por favor permtanos de 1 a 2 das hbiles para completar [address]  Los precios de los medicamentos varan con frecuencia dependiendo del Environmental consultant de dnde se surte la receta y alguna farmacias pueden ofrecer precios ms baratos.  El sitio web www.goodrx.com tiene cupones para medicamentos de Health and safety inspector. Los precios aqu no tienen en cuenta lo que podra costar con la ayuda del seguro (puede ser ms barato con su seguro), pero el sitio web puede darle el precio si no utiliz Tourist information centre manager.  - Puede imprimir el cupn correspondiente y llevarlo con su receta a la farmacia.  - Tambin puede pasar por nuestra oficina durante el horario de atencin regular y Education officer, museum una tarjeta de cupones de GoodRx.  - Si necesita que su receta se enve electrnicamente a una farmacia diferente, informe a nuestra oficina a travs de MyChart de  o por telfono llamando al [phone] y presione la  opcin 4.

## 2023-12-25 NOTE — Progress Notes (Unsigned)
 Follow-Up Visit   Subjective  Chris Johnson is a 73 y.o. male who presents for the following: Skin Cancer Screening and Full Body Skin Exam hx of Melanoma IS, AK f/u  5FU/Calcipotriene to bil temples but was not consistent, hx of spot on L calf, no symptoms  The patient presents for Total-Body Skin Exam (TBSE) for skin cancer screening and mole check. The patient has spots, moles and lesions to be evaluated, some may be new or changing and the patient may have concern these could be cancer.  The following portions of the chart were reviewed this encounter and updated as appropriate: medications, allergies, medical history  Review of Systems:  No other skin or systemic complaints except as noted in HPI or Assessment and Plan.  Objective  Well appearing patient in no apparent distress; mood and affect are within normal limits.  A full examination was performed including scalp, head, eyes, ears, nose, lips, neck, chest, axillae, abdomen, back, buttocks, bilateral upper extremities, bilateral lower extremities, hands, feet, fingers, toes, fingernails, and toenails. All findings within normal limits unless otherwise noted below.   Relevant physical exam findings are noted in the Assessment and Plan.  face x 7 (7) Pink scaly macules L calf x 1 Stuck on waxy paps with erythema  Assessment & Plan   SKIN CANCER SCREENING PERFORMED TODAY.   ACTINIC DAMAGE WITH PRECANCEROUS ACTINIC KERATOSES Counseling for Topical Chemotherapy Management: Patient exhibits: - Severe, confluent actinic changes with pre-cancerous actinic keratoses that is secondary to cumulative UV radiation exposure over time - Condition that is severe; chronic, not at goal. - diffuse scaly erythematous macules and papules with underlying dyspigmentation - Discussed Prescription Field Treatment topical Chemotherapy for Severe, Chronic Confluent Actinic Changes with Pre-Cancerous Actinic Keratoses Field treatment  involves treatment of an entire area of skin that has confluent Actinic Changes (Sun/ Ultraviolet light damage) and PreCancerous Actinic Keratoses by method of PhotoDynamic Therapy (PDT) and/or prescription Topical Chemotherapy agents such as 5-fluorouracil , 5-fluorouracil /calcipotriene, and/or imiquimod.  The purpose is to decrease the number of clinically evident and subclinical PreCancerous lesions to prevent progression to development of skin cancer by chemically destroying early precancer changes that may or may not be visible.  It has been shown to reduce the risk of developing skin cancer in the treated area. As a result of treatment, redness, scaling, crusting, and open sores may occur during treatment course. One or more than one of these methods may be used and may have to be used several times to control, suppress and eliminate the PreCancerous changes. Discussed treatment course, expected reaction, and possible side effects. - Recommend daily broad spectrum sunscreen SPF 30+ to sun-exposed areas, reapply every 2 hours as needed.  - Staying in the shade or wearing long sleeves, sun glasses (UVA+UVB protection) and wide brim hats (4-inch brim around the entire circumference of the hat) are also recommended. - Call for new or changing lesions.  - Starting 01/26/2024 - Start 5-fluorouracil /calcipotriene cream twice a day for 7 days to affected areas including bil temples. Patient has prescription from [facility] at home. Patient provided with handout reviewing treatment course and side effects and advised to call or message us  on MyChart with any concerns.  Reviewed course of treatment and expected reaction.  Patient advised to expect inflammation and crusting and advised that erosions are possible.  Patient advised to be diligent with sun protection during and after treatment. Counseled to keep medication out of reach of children and pets.   LENTIGINES,  SEBORRHEIC KERATOSES, HEMANGIOMAS -  Benign normal skin lesions - Benign-appearing - Call for any changes  MELANOCYTIC NEVI - Tan-brown and/or pink-flesh-colored symmetric macules and papules - Benign appearing on exam today - Observation - Call clinic for new or changing moles - Recommend daily use of broad spectrum spf 30+ sunscreen to sun-exposed areas.   HISTORY OF MELANOMA IN SITU - No evidence of recurrence today - No lymphadenopathy - Recommend regular full body skin exams - Recommend daily broad spectrum sunscreen SPF 30+ to sun-exposed areas, reapply every 2 hours as needed.  - Call if any new or changing lesions are noted between office visits  - L mid lat scapula excised 07/20/20  AK (ACTINIC KERATOSIS) (7) face x 7 (7) Actinic keratoses are precancerous spots that appear secondary to cumulative UV radiation exposure/sun exposure over time. They are chronic with expected duration over 1 year. A portion of actinic keratoses will progress to squamous cell carcinoma of the skin. It is not possible to reliably predict which spots will progress to skin cancer and so treatment is recommended to prevent development of skin cancer.  Recommend daily broad spectrum sunscreen SPF 30+ to sun-exposed areas, reapply every 2 hours as needed.  Recommend staying in the shade or wearing long sleeves, sun glasses (UVA+UVB protection) and wide brim hats (4-inch brim around the entire circumference of the hat). Call for new or changing lesions. Destruction of lesion - face x 7 (7) Complexity: simple   Destruction method: cryotherapy   Informed consent: discussed and consent obtained   Timeout:  patient name, date of birth, surgical site, and procedure verified Lesion destroyed using liquid nitrogen: Yes   Region frozen until ice ball extended beyond lesion: Yes   Outcome: patient tolerated procedure well with no complications   Post-procedure details: wound care instructions given    INFLAMED SEBORRHEIC KERATOSIS L calf x  1 Symptomatic, irritating, patient would like treated. Destruction of lesion - L calf x 1 Complexity: simple   Destruction method: cryotherapy   Informed consent: discussed and consent obtained   Timeout:  patient name, date of birth, surgical site, and procedure verified Lesion destroyed using liquid nitrogen: Yes   Region frozen until ice ball extended beyond lesion: Yes   Outcome: patient tolerated procedure well with no complications   Post-procedure details: wound care instructions given    Return in about 6 months (around 06/23/2024) for TBSE, Hx of Melanoma IS, Hx of AKs.  I, Grayce Saunas, RMA, am acting as scribe for Alm Rhyme, MD .   Documentation: I have reviewed the above documentation for accuracy and completeness, and I agree with the above.  Alm Rhyme, MD

## 2023-12-26 ENCOUNTER — Encounter: Payer: Self-pay | Admitting: Dermatology

## 2024-06-19 ENCOUNTER — Ambulatory Visit: Admitting: Dermatology

## 2024-06-24 ENCOUNTER — Ambulatory Visit: Admitting: Dermatology
